# Patient Record
Sex: Female | Born: 1935 | Race: White | Hispanic: No | Smoking: Never smoker
Health system: Southern US, Community
[De-identification: ages and names within clinical notes are randomized; demographics above are authoritative.]

## PROBLEM LIST (undated history)

## (undated) DIAGNOSIS — Z9010 Acquired absence of unspecified breast and nipple: Secondary | ICD-10-CM

## (undated) DIAGNOSIS — F329 Major depressive disorder, single episode, unspecified: Secondary | ICD-10-CM

## (undated) DIAGNOSIS — C50919 Malignant neoplasm of unspecified site of unspecified female breast: Secondary | ICD-10-CM

## (undated) DIAGNOSIS — C801 Malignant (primary) neoplasm, unspecified: Secondary | ICD-10-CM

## (undated) DIAGNOSIS — K259 Gastric ulcer, unspecified as acute or chronic, without hemorrhage or perforation: Secondary | ICD-10-CM

## (undated) DIAGNOSIS — F32A Depression, unspecified: Secondary | ICD-10-CM

## (undated) DIAGNOSIS — F0390 Unspecified dementia without behavioral disturbance: Secondary | ICD-10-CM

## (undated) HISTORY — PX: CHOLECYSTECTOMY: SHX55

## (undated) HISTORY — DX: Unspecified dementia, unspecified severity, without behavioral disturbance, psychotic disturbance, mood disturbance, and anxiety: F03.90

## (undated) HISTORY — PX: APPENDECTOMY: SHX54

## (undated) HISTORY — PX: MASTECTOMY: SHX3

---

## 1999-08-30 ENCOUNTER — Ambulatory Visit (HOSPITAL_COMMUNITY): Admission: RE | Admit: 1999-08-30 | Discharge: 1999-08-30 | Payer: Self-pay | Admitting: Family Medicine

## 2001-08-25 ENCOUNTER — Encounter: Payer: Self-pay | Admitting: Emergency Medicine

## 2001-08-26 ENCOUNTER — Encounter: Payer: Self-pay | Admitting: Family Medicine

## 2001-08-26 ENCOUNTER — Inpatient Hospital Stay (HOSPITAL_COMMUNITY): Admission: EM | Admit: 2001-08-26 | Discharge: 2001-08-27 | Payer: Self-pay | Admitting: Emergency Medicine

## 2003-03-29 ENCOUNTER — Encounter: Admission: RE | Admit: 2003-03-29 | Discharge: 2003-03-29 | Payer: Self-pay | Admitting: Sports Medicine

## 2003-05-25 ENCOUNTER — Ambulatory Visit (HOSPITAL_COMMUNITY): Admission: RE | Admit: 2003-05-25 | Discharge: 2003-05-25 | Payer: Self-pay | Admitting: Specialist

## 2003-07-10 ENCOUNTER — Inpatient Hospital Stay (HOSPITAL_COMMUNITY): Admission: EM | Admit: 2003-07-10 | Discharge: 2003-07-19 | Payer: Self-pay | Admitting: Emergency Medicine

## 2003-07-10 ENCOUNTER — Encounter: Payer: Self-pay | Admitting: Emergency Medicine

## 2003-07-11 ENCOUNTER — Encounter: Payer: Self-pay | Admitting: Gastroenterology

## 2003-09-09 ENCOUNTER — Encounter: Payer: Self-pay | Admitting: Surgery

## 2003-09-10 ENCOUNTER — Inpatient Hospital Stay (HOSPITAL_COMMUNITY): Admission: RE | Admit: 2003-09-10 | Discharge: 2003-09-11 | Payer: Self-pay | Admitting: Surgery

## 2003-09-23 ENCOUNTER — Encounter: Payer: Self-pay | Admitting: General Surgery

## 2003-09-23 ENCOUNTER — Encounter: Admission: RE | Admit: 2003-09-23 | Discharge: 2003-09-23 | Payer: Self-pay | Admitting: General Surgery

## 2003-10-04 ENCOUNTER — Emergency Department (HOSPITAL_COMMUNITY): Admission: EM | Admit: 2003-10-04 | Discharge: 2003-10-04 | Payer: Self-pay | Admitting: *Deleted

## 2003-10-04 ENCOUNTER — Encounter: Payer: Self-pay | Admitting: Surgery

## 2003-10-13 ENCOUNTER — Encounter: Payer: Self-pay | Admitting: Surgery

## 2003-10-13 ENCOUNTER — Encounter: Admission: RE | Admit: 2003-10-13 | Discharge: 2003-10-13 | Payer: Self-pay | Admitting: Surgery

## 2004-02-15 ENCOUNTER — Ambulatory Visit (HOSPITAL_COMMUNITY): Admission: RE | Admit: 2004-02-15 | Discharge: 2004-02-15 | Payer: Self-pay | Admitting: Specialist

## 2011-01-20 ENCOUNTER — Encounter: Payer: Self-pay | Admitting: Orthopedic Surgery

## 2012-06-26 ENCOUNTER — Emergency Department (HOSPITAL_COMMUNITY): Payer: Medicare Other

## 2012-06-26 ENCOUNTER — Encounter (HOSPITAL_COMMUNITY): Payer: Self-pay | Admitting: Family Medicine

## 2012-06-26 ENCOUNTER — Emergency Department (HOSPITAL_COMMUNITY)
Admission: EM | Admit: 2012-06-26 | Discharge: 2012-06-26 | Disposition: A | Discharge status: Home / Self Care | Payer: Medicare Other | Attending: Emergency Medicine | Admitting: Emergency Medicine

## 2012-06-26 DIAGNOSIS — R410 Disorientation, unspecified: Secondary | ICD-10-CM

## 2012-06-26 DIAGNOSIS — Z853 Personal history of malignant neoplasm of breast: Secondary | ICD-10-CM | POA: Insufficient documentation

## 2012-06-26 DIAGNOSIS — R4182 Altered mental status, unspecified: Secondary | ICD-10-CM | POA: Diagnosis not present

## 2012-06-26 DIAGNOSIS — G319 Degenerative disease of nervous system, unspecified: Secondary | ICD-10-CM | POA: Diagnosis not present

## 2012-06-26 DIAGNOSIS — Z0389 Encounter for observation for other suspected diseases and conditions ruled out: Secondary | ICD-10-CM | POA: Diagnosis not present

## 2012-06-26 DIAGNOSIS — R443 Hallucinations, unspecified: Secondary | ICD-10-CM | POA: Diagnosis not present

## 2012-06-26 DIAGNOSIS — F29 Unspecified psychosis not due to a substance or known physiological condition: Secondary | ICD-10-CM | POA: Insufficient documentation

## 2012-06-26 HISTORY — DX: Malignant (primary) neoplasm, unspecified: C80.1

## 2012-06-26 HISTORY — DX: Gastric ulcer, unspecified as acute or chronic, without hemorrhage or perforation: K25.9

## 2012-06-26 LAB — COMPREHENSIVE METABOLIC PANEL
AST: 21 U/L (ref 0–37)
Albumin: 3.6 g/dL (ref 3.5–5.2)
Alkaline Phosphatase: 78 U/L (ref 39–117)
BUN: 16 mg/dL (ref 6–23)
CO2: 26 mEq/L (ref 19–32)
Chloride: 105 mEq/L (ref 96–112)
Potassium: 3.9 mEq/L (ref 3.5–5.1)
Total Bilirubin: 0.6 mg/dL (ref 0.3–1.2)

## 2012-06-26 LAB — GLUCOSE, CAPILLARY: Glucose-Capillary: 94 mg/dL (ref 70–99)

## 2012-06-26 LAB — CBC WITH DIFFERENTIAL/PLATELET
Basophils Absolute: 0 10*3/uL (ref 0.0–0.1)
Basophils Relative: 1 % (ref 0–1)
Hemoglobin: 13.5 g/dL (ref 12.0–15.0)
Lymphocytes Relative: 33 % (ref 12–46)
MCHC: 33.9 g/dL (ref 30.0–36.0)
Monocytes Relative: 6 % (ref 3–12)
Neutro Abs: 3.9 10*3/uL (ref 1.7–7.7)
Neutrophils Relative %: 60 % (ref 43–77)
RBC: 4.34 MIL/uL (ref 3.87–5.11)
WBC: 6.5 10*3/uL (ref 4.0–10.5)

## 2012-06-26 LAB — URINALYSIS, ROUTINE W REFLEX MICROSCOPIC
Bilirubin Urine: NEGATIVE
Glucose, UA: NEGATIVE mg/dL
Ketones, ur: NEGATIVE mg/dL
pH: 6.5 (ref 5.0–8.0)

## 2012-06-26 NOTE — Progress Notes (Signed)
CSW was consulted to provide information on grief and loss along with home health services. CSW met with pt and family. CSW was informed that the pt had just left her daughter's funeral today in [place] and drove directly to the hospital for assistance. According to the EDP, the pt is having an extreme episode/reaction to the loss of her daughter. Pt and family are requesting that the pt be discharged home at this time. Pt's family have arranged for a family member to stay with the pt this evening, as the pt typically lives alone.    CSW provided the pt and family with information on local therapist and psychiatrists that work with geriatric pt's along with hospice care services. Pt and family were agreeable to set up hospice counseling, sharing that a close friend also receives counseling through hospice. CSW also provided a list of home health services along with private duty RN's to the pt and family and encouraged them to talk with the PCP Dr. Tanya Nones on Monday, regarding placing orders for home health. Information was also presented to the family on [facility] which included adult day care, respite care, and in home care volunteers. No further needs were identified at this time.

## 2012-06-26 NOTE — Discharge Instructions (Signed)
Paranoia Paranoia is a distrust of others that is not based on a real reason for distrust. This may reach delusional levels. This means the paranoid person feels the world is against them when there is no reason to make them feel that way. People with paranoia feel as though people around them are "out to get them".  SIMILAR MENTAL ILNESSES  Depression is a feeling as though you are down all the time. It is normal in some situations where you have just lost a loved one. It is abnormal if you are having feelings of paranoia with it.   Dementia is a physical problem with the brain in which the brain no longer works properly. There are problems with daily activities of living. Alzheimer's disease is one example of this. Dementia is also caused by old age changes in the brain which come with the death of brain cells and small strokes.   Paranoidschizophrenia. People with paranoid schizophrenia and persecutory delusional disorder have delusions in which they feel people around them are plotting against them. Persecutory delusions in paranoid schizophrenia are bizarre, sometimes grandiose, and often accompanied by auditory hallucinations. This means the person is hearing voices that are not there.   Delusionaldisorder (persecutory type). Delusions experienced by individuals with delusional disorder are more believable than those experienced by paranoid schizophrenics; they are not bizarre, though still unjustified. Individuals with delusional disorder may seem offbeat or quirky rather than mentally ill, and therefore, may never seek treatment.  All of these problems usually do not allow these people to interact socially in an acceptable manner. CAUSES The cause of paranoia is often not known. It is common in people with extended abuse of:  Cocaine.   Amphetamine.   Marijuana.   Alcohol.  Sometimes there is an inherited tendency. It may be associated with stress or changes in brain  chemistry. DIAGNOSIS  When paranoia is present, your caregiver may:  Refer you to a specialist.   Do a physical exam.   Perform other tests on you to make sure there are not other problems causing the paranoia including:   Physical problems.   Mental problems.   Chemical problems (other than drugs).  Testing may be done to determine if there is a psychiatric disability present that can be treated with medicine. TREATMENT   Paranoia that is a symptom of a psychiatric problem should be treated by professionals.   Medicines are available which can help this disorder. Antipsychotic medicine may be prescribed by your caregiver.   Sometimes psychotherapy may be useful.   Conditions such as depression or drug abuse are treated individually. If the paranoia is caused by drug abuse, a treatment facility may be helpful. Depression may be helped by antidepressants.  PROGNOSIS   Paranoid people are difficult to treat because of their belief that everyone is out to get them or harm them. Because of this mistrust, they often must be talked into entering treatment by a trusted family member or friend. They may not want to take medicine as they may see this as an attempt to poison them.   Gradual gains in the trust of a therapist or caregiver helps in a successful treatment plan.   Some people with PPD or persecutory delusional disorder function in society without treatment in limited fashion.  Document Released: 12/19/2003 Document Revised: 12/05/2011 Document Reviewed: 08/23/2008 [facility] Patient Information 2012 [place], [place].  RESOURCE GUIDE  Dental Problems  Patients with Medicaid: [facility]  [facility] [address]                                           [phone] [address] Phone:  [phone]                                                   Phone:  [phone]  If unable to pay or uninsured, contact:  [facility] or [facility]. to become qualified for the adult dental clinic.  Chronic Pain Problems Contact [facility]  [phone] Patients need to be referred by their primary care doctor.  Insufficient Money for Medicine Contact [employer]:  call "211" or [facility] [phone].  No Primary Care Doctor Call [facility]  [phone] Other agencies that provide inexpensive medical care    [facility]  [phone]    [facility]  [phone]    [facility]  [phone]    [facility]  [phone]    [facility]  [phone]    [facility]  [phone]  Psychological Services [facility]  [phone] [facility]  [phone] [facility]   [phone] (emergency services [phone])  Abuse/Neglect [facility] Child Abuse Hotline [phone] [facility] Child Abuse Hotline [phone] (After Hours)  Emergency Shelter [facility] [phone]  [facility] [phone] [facility] [phone]  MRSA Hotline #:   (574) 433-3843    [facility] Resources  [facility]  [employer]                           [facility] [address] [place]                     [address]         [address]  Blondell Reveal Phone:  [phone]                                  Phone:  [phone]                   Phone:  [phone]  [facility] Phone:  [phone]  [facility] Child Abuse Hotline ([phone] (After Hours)

## 2012-06-26 NOTE — ED Notes (Signed)
Talked with pt. About getting a urine sample. Pt stated that she wanted to try drinking some water so that she could get an "urge." Pt. Was given a cup of water and will attempt to void within 30 minutes.

## 2012-06-26 NOTE — ED Provider Notes (Addendum)
History     CSN: [id number]  Arrival date & time 06/26/12  1713   First MD Initiated Contact with Patient 06/26/12 1826      Chief Complaint  Patient presents with  . Altered Mental Status    (Consider location/radiation/quality/duration/timing/severity/associated sxs/prior treatment) HPI  89yoF is with also mental status. Per the patient's family they state that he has been having paranoid hallucinations. This started shortly after her daughter died a few days ago. He states that the funeral and fourth finger as well she was adamantly denying that her daughter is dead. She states that they "are trying to trick me, they put someone else in there". She also stated to her daughter that they were holding her her prisoner in a hotel room. She has been aggressive and tone but not in behavior. They state that she's been intermittently confused the last few days. Prior to this time she has had vivid nightmares and sleepwalking. No known history of dementia. No recent illness. She denies fevers, chills. The patient herself states she has no complaints at this time. She denies headache, dizziness, chest pain, shortness of breath, fevers, chills, abdominal pain, nausea, vomiting. Denies hematuria/dysuria/freq/urgency. No new medications. In fact, she takes no medications at home.  Past Medical History  Diagnosis Date  . Gastric ulcer   . Cancer     breast    Past Surgical History  Procedure Date  . Cholecystectomy   . Mastectomy     History reviewed. No pertinent family history.  History  Substance Use Topics  . Smoking status: Never Smoker   . Smokeless tobacco: Not on file  . Alcohol Use: No    OB History    Grav Para Term Preterm Abortions TAB SAB Ect Mult Living                  Review of Systems  All other systems reviewed and are negative.   except as noted HPI   Allergies  Ciprofloxacin  Home Medications   Current Outpatient Rx  Name Route Sig Dispense Refill    . PSYLLIUM 58.6 % PO PACK Oral Take 1 packet by mouth daily.      BP 157/71  Pulse 94  Temp 98.1 F (36.7 C) (Oral)  Resp 16  SpO2 97%  Physical Exam  Nursing note and vitals reviewed. Constitutional: She is oriented to person, place, and time. She appears well-developed.  HENT:  Head: Atraumatic.  Mouth/Throat: Oropharynx is clear and moist.  Eyes: Conjunctivae and EOM are normal. Pupils are equal, round, and reactive to light.  Neck: Normal range of motion. Neck supple.  Cardiovascular: Normal rate, regular rhythm, normal heart sounds and intact distal pulses.   Pulmonary/Chest: Effort normal and breath sounds normal. No respiratory distress. She has no wheezes. She has no rales.  Abdominal: Soft. She exhibits no distension. There is no tenderness. There is no rebound and no guarding.  Musculoskeletal: Normal range of motion. She exhibits no edema.  Neurological: She is alert and oriented to person, place, and time. No cranial nerve deficit. She exhibits normal muscle tone. Coordination normal.       Strength 5/5 all extremities No pronator drift No facial droop   Skin: Skin is warm and dry. No rash noted.  Psychiatric: She has a normal mood and affect.    Date: 06/28/2012  Rate: 84  Rhythm: normal sinus rhythm  QRS Axis: left  Intervals: normal  ST/T Wave abnormalities: normal  Conduction Disutrbances:none  Narrative Interpretation: PVCs  Old EKG Reviewed: none available    ED Course  Procedures (including critical care time)  Labs Reviewed  COMPREHENSIVE METABOLIC PANEL - Abnormal; Notable for the following:    GFR calc non Af Amer 73 (*)     GFR calc Af Amer 85 (*)     All other components within normal limits  URINALYSIS, ROUTINE W REFLEX MICROSCOPIC - Abnormal; Notable for the following:    Hgb urine dipstick TRACE (*)     All other components within normal limits  GLUCOSE, CAPILLARY  CBC WITH DIFFERENTIAL  LIPASE, BLOOD  URINE MICROSCOPIC-ADD ON  LAB  REPORT - SCANNED   Dg Chest 2 View  06/26/2012  *RADIOLOGY REPORT*  Clinical Data: Altered mental status  CHEST - 2 VIEW  Comparison: None  Findings: The heart size and mediastinal contours are within normal limits. There are coarsened interstitial markings identified bilaterally.  Surgical clips noted in the left axilla.  No airspace consolidation.  The visualized skeletal structures are unremarkable.  IMPRESSION:  No acute findings.  Original Report Authenticated By: Rosealee Albee, M.D.   Ct Head Wo Contrast  06/26/2012  *RADIOLOGY REPORT*  Clinical Data: Altered mental status.  CT HEAD WITHOUT CONTRAST  Technique:  Contiguous axial images were obtained from the base of the skull through the vertex without contrast.  Comparison: No priors.  Findings: Mild cerebral and cerebellar atrophy is noted and is age appropriate.  No definite evidence to suggest acute/subacute cerebral ischemia, no intracranial hemorrhage, no focal mass, mass effect, hydrocephalus or abnormal intra or extra-axial fluid collections.  No acute displaced skull fractures are identified. Visualized paranasal sinuses and mastoids are well pneumatized.  IMPRESSION: 1.  No acute intracranial abnormality. 2.  Mild cerebral and cerebellar atrophy is age appropriate.  Original Report Authenticated By: Florencia Reasons, M.D.    1. Hallucination   2. Confusion     MDM  The patient is having paranoid hallucinations and some confusion. Question biliary versus dementia versus grief reaction due to her recent daughters death. She was also in completely different environment. Her ED workup including CT head, labs, urinalysis are completely unremarkable. Her neuro exam is unremarkable. She appears well at this time her vitals are stable. She is refusing psychiatry evaluation in the emergency department. She wants to go home and the family is agreeable to this. They think given resources by social worker for possible help with home health at  home. Over the weekend they will stay with her and she will follow with her primary care doctor on Monday. No EMC precluding discharge at this time. Given Precautions for return. PMD f/u.         Forbes Cellar, MD 06/28/12 1610  Forbes Cellar, MD 06/28/12 [phone]

## 2012-06-26 NOTE — ED Notes (Signed)
Family states that pt's daughter recently died, and pt and some family went to attend funeral in [place].  Son in law states that the night before last pt began to say that there was a stranger in the house and that she would call the police.  Pt had nightmares throughout the night.  The next am the pt would not look at daughter until family arrived.  Later that day pt did not recognize daughter, and said delusion things such as there were armed guards with dogs at the house she was staying at, and that the family was drugging her.  Once pt got home pt still had episodes of not recognizing her daughter and granddaughters.

## 2012-06-28 ENCOUNTER — Emergency Department (HOSPITAL_COMMUNITY)
Admission: EM | Admit: 2012-06-28 | Discharge: 2012-06-28 | Disposition: A | Discharge status: Home / Self Care | Payer: Medicare Other | Attending: Emergency Medicine | Admitting: Emergency Medicine

## 2012-06-28 ENCOUNTER — Encounter (HOSPITAL_COMMUNITY): Payer: Self-pay

## 2012-06-28 DIAGNOSIS — Z0389 Encounter for observation for other suspected diseases and conditions ruled out: Secondary | ICD-10-CM | POA: Diagnosis not present

## 2012-06-28 DIAGNOSIS — R443 Hallucinations, unspecified: Secondary | ICD-10-CM | POA: Diagnosis not present

## 2012-06-28 DIAGNOSIS — Z853 Personal history of malignant neoplasm of breast: Secondary | ICD-10-CM | POA: Insufficient documentation

## 2012-06-28 DIAGNOSIS — F23 Brief psychotic disorder: Secondary | ICD-10-CM | POA: Diagnosis not present

## 2012-06-28 DIAGNOSIS — IMO0002 Reserved for concepts with insufficient information to code with codable children: Secondary | ICD-10-CM | POA: Diagnosis not present

## 2012-06-28 MED ORDER — RISPERIDONE 0.25 MG PO TABS: 0.2500 mg | ORAL_TABLET | Freq: Every day | ORAL | Status: DC

## 2012-06-28 NOTE — Discharge Instructions (Signed)
Psychosis  Psychosis refers to a severe lack of understanding with reality. During a psychotic episode, you are not able to think clearly. During a psychotic episode, your responses and emotions are inappropriate and do not coincide with what is actually happening. You often have false beliefs about what is happening or who you are (delusions), and you may see, hear, taste, smell, or feel things that are not present (hallucinations). Psychosis is usually a severe symptom of a very serious mental health (psychiatric) condition, but it can sometimes be the result of a medical condition.  CAUSES    Psychiatric conditions, such as:   Schizophrenia.   Bipolar disorder.   Depression.   Personality disorders.   Alcohol or drug abuse.   Medical conditions, such as:   Brain injury.   Brain tumor.   Dementia.   Brain diseases, such as Alzheimer's, Parkinson's, or Huntington's disease.   Neurological diseases, such as epilepsy.   Genetic disorders.   Metabolic disorders.   Infections that affect the brain.   Certain prescription drugs.   Stroke.  SYMPTOMS    Unable to think or speak clearly or respond appropriately.   Disorganized thinking (thoughts jump from one thought to another).   Severe inappropriate behavior.   Delusions may include:   A strong belief that is odd, unrealistic, or false.   Feeling extremely fearful or suspicious (paranoid).   Believing you are someone else, have high importance, or have an altered identity.   Hallucinations.  DIAGNOSIS    Mental health evaluation.   Physical exam.   Blood tests.   Computerized magnetic scan (MRI) or other brain scans.  TREATMENT   Your caregiver will recommend a course of treatment that depends on the cause of the psychosis.  Treatment may include:   Monitoring and supportive care in the hospital.   Taking medicines (antipsychotic medicine) to reduce symptoms and balance chemicals in the brain.   Taking medicines to manage underlying  mental health conditions.   Therapy and other supportive programs outside of the hospital.   Treating an underlying medical condition.  If the cause of the psychosis can be treated or corrected, the outlook is good. Without treatment, psychotic episodes can cause danger to yourself or others. Treatment may be short-term or lifelong.  HOME CARE INSTRUCTIONS    Take all medicines as directed. This is important.   Use a pillbox or write down your medicine schedule to make sure you are taking them.   Check with your caregiver before using over-the-counter medicines, herbs, or supplements.   Seek individual and family support through therapy and mental health education (psychoeducation) programs. These will help you manage symptoms and side effects of medicines, learn life skills, and maintain a healthy routine.   Maintain a healthy lifestyle.   Exercise regularly.   Avoid alcohol and drugs.   Learn ways to reduce stress and cope with stress, such as yoga and meditation.   Talk about your feelings with family members or caregivers.   Make time for yourself to do things you enjoy.   Know the early warning signs of psychosis. Your caregiver will recommend steps to take when you notice symptoms such as:   Feeling anxious or preoccupied.   Having racing thoughts.   Changes in your interest in life and relationships.   Follow up with your caregivers for continued outpatient treatment as directed.  SEEK MEDICAL CARE IF:    Medicines do not seem to be helping.   You hear   or feel things that are not there.   You feel hopeless and overwhelmed.   You feel extremely fearful and suspicious that something will harm you.   You feel like you cannot leave your house.   You have trouble taking care of yourself.   You experience side effects of medicines, such as changes in sleep patterns, dizziness, weight  gain, restlessness, movement changes, muscle spasms, or tremors.  SEEK IMMEDIATE MEDICAL CARE IF:  Severe psychotic symptoms present a safety issue (such as an urge to hurt yourself or others). MAKE SURE YOU:   Understand these instructions.   Will watch your condition.   Will get help right away if you are not doing well or get worse.  FOR MORE INFORMATION  National Institute of Mental Health: http://www.maynard.net/ Document Released: 06/05/2010 Document Revised: 12/05/2011 Document Reviewed: 06/05/2010 [facility] Patient Information 2012 [place], [place].RESOURCE GUIDE  Chronic Pain Problems: Contact [facility]  [phone] Patients need to be referred by their primary care doctor.  Insufficient Money for Medicine: Contact United Way:  call "211" or [facility] 587-697-3566.  No Primary Care Doctor: - Call [facility]  [phone] - can help you locate a primary care doctor that  accepts your insurance, provides certain services, etc. - Physician Referral Service- [phone]  Agencies that provide inexpensive medical care: - [facility]  [phone] - [facility]  [phone] - [facility]  [phone] - Women's Clinic  [phone] - Planned Parenthood  [phone] [facility]  [phone]  Medicaid-accepting [facility] Providers: - [facility]- [address]  [phone], Mon-Fri 9am-7pm, Sat 9am-1pm - [facility]- [address] [place], [address] [place]  [phone] - [facility]- [address], [address] [place]  [phone] [facility]- [address]  [phone] - Renaye Rakers- [address] [place], [address], [phone]  Only accepts [place] patients after they have their name  applied to their card  Self Pay (no insurance) in [place]: - Sickle Cell Patients: Dr Willey Blade, [facility] Internal  Medicine  [address] [place], [phone] - [facility]- [address] [place]  [phone]       [facility] [place]- [address]       -     [facility]- see information above (Speak to [employer] if you do not have insurance)       -  [facility]- [address] [place], [phone]       -  [facility]- [address],  [phone]       -  [facility]- [address], [phone]       -  Dr Julio Sicks-  [address], [place], [phone]       -  [facility]- [address] [phone]       -  [facility]- [address] [phone], also [address] [phone]       -    [facility]- [address] [place], [phone], 1st & 3rd Saturday   every month, 10am-1pm  1) Find a Doctor and Pay Out of Pocket Although you won't have to find out who is covered by your insurance plan, it is  a good idea to ask around and get recommendations. You will then need to call the office and see if the doctor you have chosen will accept you as a new patient and what types of options they offer for patients who are self-pay. Some doctors offer discounts or will set up payment plans for their patients who do not have insurance, but you will need to ask so you aren't surprised when you get to your appointment.  2) Contact Your Local Health Department Not all health departments have doctors that can see patients for sick visits, but many do, so it is worth a call to see if yours does. If you don't know where your local health department is, you can check in your phone book. The CDC also has a tool to help you locate your state's health department, and many state websites also have listings of all of their local health departments.  3) Find a Walk-in Clinic If your illness is not likely to be very severe or complicated, you may want to try a walk in clinic. These are popping up all over the  country in pharmacies, drugstores, and shopping centers. They're usually staffed by nurse practitioners or physician assistants that have been trained to treat common illnesses and complaints. They're usually fairly quick and inexpensive. However, if you have serious medical issues or chronic medical problems, these are probably not your best option  STD Testing - [facility] Department of [facility], STD Clinic, [address], [place], phone [phone] or [phone].  Monday - Friday, call for an appointment. [facility] Department of [employer], STD Clinic, [address], [place], phone [phone] or [phone].  Monday - Friday, call for an appointment.  Abuse/Neglect: [facility] Child Abuse Hotline 480 011 2789 [facility] Child Abuse Hotline 217-233-1304 (After Hours)  Emergency Shelter:  [facility] 872-559-9313  Maternity Homes: - [facility] 856-436-3865 - [facility] [phone]  MRSA Hotline #:   (229) 413-8530  [facility] Resources  [facility] [place]  [employer] [facility] Dept. [address]                 [address]         [address] [place] [address]  Blondell Reveal Phone:  [phone]                                  Phone:  [phone]                   Phone:  [phone]  [facility] Mental Health, [phone] - [facility] - [phone]       -     [facility] in [place], [address],  [phone], [facility] Child Abuse Hotline (740) 521-9897 or (586)198-3830 (After Hours)   Behavioral Health Services  Substance Abuse Resources: - [facility] Drug [facility]  810-593-4152 - [facility] [phone] - [facility] [place] [phone] Floydene Flock [phone] - Residential & Outpatient Substance Abuse Program  3133175610  Psychological Services: [facility]  [phone] [facility] Services  [phone] - [facility], (208)506-3032 [place]. [address], [place], ACCESS LINE: 8257661020 or 901-595-5075, [employer].za  Dental Assistance  If unable to pay or uninsured, contact:  [facility] or [facility]. to become qualified for the adult dental clinic.  Patients with Medicaid: [facility] [phone] [address], [phone] [address] [phone]  If unable to pay, or uninsured, contact [facility] 3150952926) or [facility] Department [phone]) [phone] in [place], [phone] in [facility]) to become qualified for the adult dental clinic  Other Low-Cost Community Dental Services: - [facility]- [address] [place], [place], [place], [address], [phone], [phone], 2nd and 4th Thursday of the month at 6:30am.  10 clients each day by appointment, can sometimes see walk-in patients if someone does not show for an appointment. [facility]- [address] Ether Griffins [place], [place], [address], [phone] - [facility]- [address] [place], [place], [address], [phone] - [facility]- [phone] [facility] Department- [phone] [facility] Department- [phone]

## 2012-06-28 NOTE — ED Notes (Signed)
Daughter has called twice today regarding mother's mental status. Mother is paranoid and thinks people are trying to kill her. The family has attempted to bring pt via POV and was unsuccessful. At present they have called for EMS to assist. Daughter aware that she may have to have mother IVC'd in order to get help for her.

## 2012-06-28 NOTE — ED Notes (Signed)
Pt's son-in-law committed suicide recently, daughter died of heart attack and funeral was Thursday. Family member sts pt been acting abnormal since the night prior to the funeral, abnormalities include not recognizing her family members, refusing to believe her daughter passed away, believes people are trying to harm her, and believes her foods has poison therefore refusing to eat nor drink. Family member brought pt in on Friday, evaluated by Dr. Hyman Hopes dx with paranoid delusion, send home for Psy MD follow-up. Pt feeling better on Saturday, and back today to the ED for worsening of same symptoms. Upon assessing pt, pt denied pain, alert, knows who she is, but doesn't know why she is here.

## 2012-06-28 NOTE — ED Notes (Signed)
OZH:[id number]<HQ> Expected date:06/28/12<BR> Expected time: 3:20 PM<BR> Means of arrival:<BR> Comments:<BR> Hold per charge

## 2012-06-28 NOTE — ED Provider Notes (Addendum)
History     CSN: [id number] Arrival date & time 06/28/12  1523 First MD Initiated Contact with Patient 06/28/12 1600      Chief Complaint  Patient presents with  . Delusional    paranoid   HPI Per nursing notes: Daughter died of heart attack and funeral was Thursday. Family member sts pt been acting abnormal since the night prior to the funeral, abnormalities include not recognizing her family members, refusing to believe her daughter passed away, believes people are trying to harm her, and believes her foods has poison therefore refusing to eat nor drink. Family member brought pt in on Friday, evaluated by Dr. Hyman Hopes dx with paranoid delusion, send home for Psy MD follow-up. Pt feeling better on Saturday, and back today to the ED for worsening of same symptoms. Upon assessing pt, pt denied pain, alert, knows who she is, but doesn't know why she is here.  The family is still very concerned because she is not acting like her normal self. She has not had any fevers or any pain. She has not had any recent falls. The patient herself is not really sure whether these things she has been seeing a real. Patient has are story about what she saw during the funeral including egyptian symbols, a "bore" and "bier"  Past Medical History  Diagnosis Date  . Gastric ulcer   . Cancer     breast    Past Surgical History  Procedure Date  . Cholecystectomy   . Mastectomy     No family history on file.  History  Substance Use Topics  . Smoking status: Never Smoker   . Smokeless tobacco: Not on file  . Alcohol Use: No    OB History    Grav Para Term Preterm Abortions TAB SAB Ect Mult Living                  Review of Systems  All other systems reviewed and are negative.    Allergies  Ciprofloxacin  Home Medications   Current Outpatient Rx  Name Route Sig Dispense Refill  . DOCUSATE SODIUM 100 MG PO CAPS Oral Take 100 mg by mouth 2 (two) times daily.    Marland Kitchen POLYETHYLENE GLYCOL 3350 PO  PACK Oral Take 17 g by mouth daily.    Scherrie November PO POWD Oral Take 1 scoop by mouth daily.      BP 144/49  Pulse 84  Temp 98.9 F (37.2 C) (Oral)  Resp 23  SpO2 100%  Physical Exam  Nursing note and vitals reviewed. Constitutional: She is oriented to person, place, and time. She appears well-developed and well-nourished. No distress.  HENT:  Head: Normocephalic and atraumatic.  Right Ear: External ear normal.  Left Ear: External ear normal.  Mouth/Throat: Oropharynx is clear and moist.  Eyes: Conjunctivae are normal. Right eye exhibits no discharge. Left eye exhibits no discharge. No scleral icterus.  Neck: Neck supple. No tracheal deviation present.  Cardiovascular: Normal rate, regular rhythm and intact distal pulses.   Pulmonary/Chest: Effort normal and breath sounds normal. No stridor. No respiratory distress. She has no wheezes. She has no rales.  Abdominal: Soft. Bowel sounds are normal. She exhibits no distension. There is no tenderness. There is no rebound and no guarding.  Musculoskeletal: She exhibits no edema and no tenderness.  Neurological: She is alert and oriented to person, place, and time. She has normal strength. No cranial nerve deficit ( no gross defecits noted) or sensory deficit. She  exhibits normal muscle tone. She displays no seizure activity. Coordination normal.       No pronator drift bilateral upper extrem, able to hold both legs off bed for 5 seconds, sensation intact in all extremities, no visual field cuts, no left or right sided neglect  Skin: Skin is warm and dry. No rash noted.  Psychiatric: She has a normal mood and affect. Her behavior is normal. She expresses no homicidal and no suicidal ideation.       delusions    ED Course  Procedures (including critical care time) Pt had labs on 6/28: CBC , CMET, head ct, ua all normal.  Family does not want additional testing done today.  Labs Reviewed - No data to display Dg Chest 2 View  06/26/2012   *RADIOLOGY REPORT*  Clinical Data: Altered mental status  CHEST - 2 VIEW  Comparison: None  Findings: The heart size and mediastinal contours are within normal limits. There are coarsened interstitial markings identified bilaterally.  Surgical clips noted in the left axilla.  No airspace consolidation.  The visualized skeletal structures are unremarkable.  IMPRESSION:  No acute findings.  Original Report Authenticated By: Rosealee Albee, M.D.   Ct Head Wo Contrast  06/26/2012  *RADIOLOGY REPORT*  Clinical Data: Altered mental status.  CT HEAD WITHOUT CONTRAST  Technique:  Contiguous axial images were obtained from the base of the skull through the vertex without contrast.  Comparison: No priors.  Findings: Mild cerebral and cerebellar atrophy is noted and is age appropriate.  No definite evidence to suggest acute/subacute cerebral ischemia, no intracranial hemorrhage, no focal mass, mass effect, hydrocephalus or abnormal intra or extra-axial fluid collections.  No acute displaced skull fractures are identified. Visualized paranasal sinuses and mastoids are well pneumatized.  IMPRESSION: 1.  No acute intracranial abnormality. 2.  Mild cerebral and cerebellar atrophy is age appropriate.  Original Report Authenticated By: Florencia Reasons, M.D.     1. Brief reactive psychosis       MDM  The patient was evaluated by the psychiatrist (tele).  He felt the patient had a brief reactive psychosis associated with her grief reaction. Patient does not have a delirium .  She does not meet inpatient criteria. The patient does not like to take medications but she is willing to try a prescription for Risperdal 0.25 mg by mouth daily as recommended. The patient and family were also provided outpatient resources with regards to finding a psychiatrist.        Celene Kras, MD 06/28/12 1843  Celene Kras, MD 07/16/12 0800

## 2012-06-28 NOTE — ED Notes (Signed)
psy tele-consult called, psychiatrist will call back in to 1 hour

## 2012-06-29 ENCOUNTER — Emergency Department (HOSPITAL_COMMUNITY)
Admission: EM | Admit: 2012-06-29 | Discharge: 2012-06-30 | Disposition: A | Discharge status: Other Acute Inpatient Hospital | Payer: Medicare Other | Attending: Emergency Medicine | Admitting: Emergency Medicine

## 2012-06-29 ENCOUNTER — Emergency Department (HOSPITAL_COMMUNITY): Payer: Medicare Other

## 2012-06-29 ENCOUNTER — Encounter (HOSPITAL_COMMUNITY): Payer: Self-pay | Admitting: *Deleted

## 2012-06-29 ENCOUNTER — Telehealth (HOSPITAL_COMMUNITY): Payer: Self-pay | Admitting: Licensed Clinical Social Worker

## 2012-06-29 DIAGNOSIS — Z0389 Encounter for observation for other suspected diseases and conditions ruled out: Secondary | ICD-10-CM | POA: Diagnosis not present

## 2012-06-29 DIAGNOSIS — R443 Hallucinations, unspecified: Secondary | ICD-10-CM | POA: Diagnosis not present

## 2012-06-29 DIAGNOSIS — Z853 Personal history of malignant neoplasm of breast: Secondary | ICD-10-CM | POA: Insufficient documentation

## 2012-06-29 DIAGNOSIS — R451 Restlessness and agitation: Secondary | ICD-10-CM

## 2012-06-29 DIAGNOSIS — IMO0002 Reserved for concepts with insufficient information to code with codable children: Secondary | ICD-10-CM | POA: Diagnosis not present

## 2012-06-29 DIAGNOSIS — Z79899 Other long term (current) drug therapy: Secondary | ICD-10-CM | POA: Insufficient documentation

## 2012-06-29 DIAGNOSIS — R4586 Emotional lability: Secondary | ICD-10-CM | POA: Insufficient documentation

## 2012-06-29 HISTORY — DX: Major depressive disorder, single episode, unspecified: F32.9

## 2012-06-29 HISTORY — DX: Depression, unspecified: F32.A

## 2012-06-29 LAB — URINALYSIS, ROUTINE W REFLEX MICROSCOPIC
Ketones, ur: NEGATIVE mg/dL
Leukocytes, UA: NEGATIVE
Nitrite: NEGATIVE
Urobilinogen, UA: 0.2 mg/dL (ref 0.0–1.0)
pH: 5.5 (ref 5.0–8.0)

## 2012-06-29 LAB — COMPREHENSIVE METABOLIC PANEL
Alkaline Phosphatase: 75 U/L (ref 39–117)
BUN: 15 mg/dL (ref 6–23)
CO2: 23 mEq/L (ref 19–32)
Chloride: 104 mEq/L (ref 96–112)
GFR calc Af Amer: 89 mL/min — ABNORMAL LOW (ref >90)
Glucose, Bld: 94 mg/dL (ref 70–99)
Potassium: 4.3 mEq/L (ref 3.5–5.1)
Total Bilirubin: 0.8 mg/dL (ref 0.3–1.2)

## 2012-06-29 LAB — URINE MICROSCOPIC-ADD ON

## 2012-06-29 LAB — CBC WITH DIFFERENTIAL/PLATELET
Basophils Absolute: 0 10*3/uL (ref 0.0–0.1)
Eosinophils Relative: 1 % (ref 0–5)
Lymphocytes Relative: 25 % (ref 12–46)
MCV: 90.8 fL (ref 78.0–100.0)
Neutro Abs: 5.6 10*3/uL (ref 1.7–7.7)
Platelets: 161 10*3/uL (ref 150–400)
RDW: 13.5 % (ref 11.5–15.5)
WBC: 8.1 10*3/uL (ref 4.0–10.5)

## 2012-06-29 LAB — TROPONIN I: Troponin I: <0.3 ng/mL (ref <0.30)

## 2012-06-29 LAB — RAPID URINE DRUG SCREEN, HOSP PERFORMED
Benzodiazepines: NOT DETECTED
Cocaine: NOT DETECTED
Opiates: NOT DETECTED

## 2012-06-29 LAB — ETHANOL: Alcohol, Ethyl (B): <11 mg/dL (ref 0–11)

## 2012-06-29 NOTE — ED Notes (Signed)
Upon rn assessment. Pt lives alone is alert and oriented x4. 2 weeks ago pts daugther died. Pt had to travel to [facility] for funeral. Pt reports that at funeral she "saw lots of demons". But the priest came and she no longer sees demons. Pt denies any other auditory or visual hallucinations. Denies SI/ HI.   Pt at time is very agitated with her daughter Arline Asp. Pt reports that her daughter cindy tries to bait her. pts son in law has a video of pt being agitated towards cindy that he showed to rn. Family unsure what do to with pt at this time.

## 2012-06-29 NOTE — ED Notes (Signed)
MD at bedside. 

## 2012-06-29 NOTE — Progress Notes (Signed)
EDP, Mcmanus and CM reviewed pt.  Family has brought pt in to [facility] ED on 06/26/12, 06/27/12, 06/28/12 and 06/29/12.  ACT team reviewing pt possible geriatric placement.

## 2012-06-29 NOTE — ED Notes (Signed)
1 bag of belongings locked in locker 29. 1 yellow colored sweater, blue pants, blue shirt, 1 pair of black shoes, 2 white socks.

## 2012-06-29 NOTE — ED Notes (Signed)
Pt back from CT

## 2012-06-29 NOTE — ED Notes (Signed)
[id number]<QM> Expected date:06/29/12<BR> Expected time:12:14 PM<BR> Means of arrival:Ambulance<BR> Comments:<BR>

## 2012-06-29 NOTE — ED Notes (Signed)
Patient resting quietly in bed with eyes closed. Respirations equal and unlabored. Skin warm and dry. NO acute distress noted.

## 2012-06-29 NOTE — ED Notes (Signed)
Pts family would like to be called if pt is moved Jolaine Click [phone](home), [phone](cell)

## 2012-06-29 NOTE — ED Notes (Signed)
Spoke with act team, pt to be seen next.

## 2012-06-29 NOTE — ED Provider Notes (Signed)
History     CSN: [id number]  Arrival date & time 06/29/12  1220   First MD Initiated Contact with Patient 06/29/12 1228      Chief Complaint  Patient presents with  . Medical Clearance    HPI Pt was seen at 1240.  Per pt and family, c/o gradual onset and worsening of persistent emotionally labilily, being "mean" to her family, and confusion for the past 6 months, worse over the past 2 weeks.  Family states pt will occasionally not recognize them, asking who they are and what are they doing to her "keeping me down like this" and "trying to kill me."  Pt repetitively stating to family that she "wants to go home" while she is standing in her own home, and apparently packed her suitcase last night, threatening to leave "for a long walk until I dropped."  Apparently pt's grandchild laid in front of the door so she would not leave the house.  Pt states to me that she has been seeing "a wild boar" since the recent funeral for her daughter 2 weeks ago, and that driving in the car to [place] for the funeral "was like being in a movie."   Pt states she "spoke with the [facility]" yesterday with resolution of visual hallucination of the "wild boar," but pt's family states they also gave her one dose (her first dose) of Risperdal yesterday; as rx by the ED yesterday for this same complaint.  Pt has been previously eval in the ED x2 for same, most recently yesterday, with family returning today saying they "can't take care of her like this."  Pt denies SI/SA, no HI.     Past Medical History  Diagnosis Date  . Gastric ulcer   . Cancer     breast    Past Surgical History  Procedure Date  . Cholecystectomy   . Mastectomy     History  Substance Use Topics  . Smoking status: Never Smoker   . Smokeless tobacco: Not on file  . Alcohol Use: No    Review of Systems ROS: Statement: All systems negative except as marked or noted in the HPI; Constitutional: Negative for fever and chills. ; ; Eyes: Negative for  eye pain, redness and discharge. ; ; ENMT: Negative for ear pain, hoarseness, nasal congestion, sinus pressure and sore throat. ; ; Cardiovascular: Negative for chest pain, palpitations, diaphoresis, dyspnea and peripheral edema. ; ; Respiratory: Negative for cough, wheezing and stridor. ; ; Gastrointestinal: Negative for nausea, vomiting, diarrhea, abdominal pain, blood in stool, hematemesis, jaundice and rectal bleeding. . ; ; Genitourinary: Negative for dysuria, flank pain and hematuria. ; ; Musculoskeletal: Negative for back pain and neck pain. Negative for swelling and trauma.; ; Skin: Negative for pruritus, rash, abrasions, blisters, bruising and skin lesion.; ; Neuro: Negative for headache, lightheadedness and neck stiffness. Negative for weakness, altered level of consciousness , altered mental status, extremity weakness, paresthesias, involuntary movement, seizure and syncope.; Psych:  No SI, no SA, no HI, +hallucinations.      Allergies  Ciprofloxacin  Home Medications   Current Outpatient Rx  Name Route Sig Dispense Refill  . DOCUSATE SODIUM 100 MG PO CAPS Oral Take 100 mg by mouth 2 (two) times daily.    Marland Kitchen POLYETHYLENE GLYCOL 3350 PO PACK Oral Take 17 g by mouth daily.    Scherrie November PO POWD Oral Take 1 scoop by mouth daily.    Marland Kitchen RISPERIDONE 0.25 MG PO TABS Oral Take 1 tablet (  0.25 mg total) by mouth at bedtime. 30 tablet 1    BP 166/66  Pulse 85  Temp 98.7 F (37.1 C) (Oral)  Resp 12  SpO2 100%  Physical Exam 1245: Physical examination:  Nursing notes reviewed; Vital signs and O2 SAT reviewed;  Constitutional: Well developed, Well nourished, Well hydrated, In no acute distress; Head:  Normocephalic, atraumatic; Eyes: EOMI, PERRL, No scleral icterus; ENMT: Mouth and pharynx normal, Mucous membranes moist; Neck: Supple, Full range of motion, No lymphadenopathy; Cardiovascular: Regular rate and rhythm, No murmur, rub, or gallop; Respiratory: Breath sounds clear & equal  bilaterally, No rales, rhonchi, wheezes.  Speaking full sentences with ease, Normal respiratory effort/excursion; Chest: Nontender, Movement normal; Abdomen: Soft, Nontender, Nondistended, Normal bowel sounds;; Extremities: Pulses normal, No tenderness, No edema, No calf edema or asymmetry.; Neuro: AA&Ox3, Major CN grossly intact.  Speech clear. No gross focal motor or sensory deficits in extremities.; Skin: Color normal, Warm, Dry; Psych:  Affect flat, poor eye contact, denies HI, denies SI, no hallucinations currently.   ED Course  Procedures   MDM  MDM Reviewed: nursing note, previous chart and vitals Reviewed previous: ECG and labs Interpretation: ECG, labs, x-ray and CT scan    Date: 06/29/2012  Rate: 78  Rhythm: normal sinus rhythm and premature ventricular contractions (PVC)  QRS Axis: left  Intervals: normal  ST/T Wave abnormalities: normal  Conduction Disutrbances:nonspecific intraventricular conduction delay  Narrative Interpretation:   Old EKG Reviewed: unchanged; no significant changes from previous EKG dated 06/26/2012.  Results for orders placed during the hospital encounter of 06/29/12  COMPREHENSIVE METABOLIC PANEL      Component Value Range   Sodium 139  135 - 145 mEq/L   Potassium 4.3  3.5 - 5.1 mEq/L   Chloride 104  96 - 112 mEq/L   CO2 23  19 - 32 mEq/L   Glucose, Bld 94  70 - 99 mg/dL   BUN 15  6 - 23 mg/dL   Creatinine, Ser 1.61  0.50 - 1.10 mg/dL   Calcium 9.2  8.4 - 09.6 mg/dL   Total Protein 7.1  6.0 - 8.3 g/dL   Albumin 3.9  3.5 - 5.2 g/dL   AST 27  0 - 37 U/L   ALT 13  0 - 35 U/L   Alkaline Phosphatase 75  39 - 117 U/L   Total Bilirubin 0.8  0.3 - 1.2 mg/dL   GFR calc non Af Amer 77 (*) >90 mL/min   GFR calc Af Amer 89 (*) >90 mL/min  CBC WITH DIFFERENTIAL      Component Value Range   WBC 8.1  4.0 - 10.5 K/uL   RBC 4.45  3.87 - 5.11 MIL/uL   Hemoglobin 13.9  12.0 - 15.0 g/dL   HCT 04.5  40.9 - 81.1 %   MCV 90.8  78.0 - 100.0 fL   MCH 31.2   26.0 - 34.0 pg   MCHC 34.4  30.0 - 36.0 g/dL   RDW 91.4  78.2 - 95.6 %   Platelets 161  150 - 400 K/uL   Neutrophils Relative 69  43 - 77 %   Neutro Abs 5.6  1.7 - 7.7 K/uL   Lymphocytes Relative 25  12 - 46 %   Lymphs Abs 2.0  0.7 - 4.0 K/uL   Monocytes Relative 6  3 - 12 %   Monocytes Absolute 0.5  0.1 - 1.0 K/uL   Eosinophils Relative 1  0 - 5 %  Eosinophils Absolute 0.0  0.0 - 0.7 K/uL   Basophils Relative 1  0 - 1 %   Basophils Absolute 0.0  0.0 - 0.1 K/uL  URINALYSIS, ROUTINE W REFLEX MICROSCOPIC      Component Value Range   Color, Urine YELLOW  YELLOW   APPearance CLEAR  CLEAR   Specific Gravity, Urine 1.014  1.005 - 1.030   pH 5.5  5.0 - 8.0   Glucose, UA NEGATIVE  NEGATIVE mg/dL   Hgb urine dipstick TRACE (*) NEGATIVE   Bilirubin Urine NEGATIVE  NEGATIVE   Ketones, ur NEGATIVE  NEGATIVE mg/dL   Protein, ur NEGATIVE  NEGATIVE mg/dL   Urobilinogen, UA 0.2  0.0 - 1.0 mg/dL   Nitrite NEGATIVE  NEGATIVE   Leukocytes, UA NEGATIVE  NEGATIVE  TROPONIN I      Component Value Range   Troponin I <0.30  <0.30 ng/mL  ETHANOL      Component Value Range   Alcohol, Ethyl (B) <11  0 - 11 mg/dL  URINE RAPID DRUG SCREEN (HOSP PERFORMED)      Component Value Range   Opiates NONE DETECTED  NONE DETECTED   Cocaine NONE DETECTED  NONE DETECTED   Benzodiazepines NONE DETECTED  NONE DETECTED   Amphetamines NONE DETECTED  NONE DETECTED   Tetrahydrocannabinol NONE DETECTED  NONE DETECTED   Barbiturates NONE DETECTED  NONE DETECTED  URINE MICROSCOPIC-ADD ON      Component Value Range   Squamous Epithelial / LPF FEW (*) RARE   RBC / HPF 0-2  <3 RBC/hpf   Dg Chest 2 View 06/29/2012  *RADIOLOGY REPORT*  Clinical Data: Confusion.  CHEST - 2 VIEW  Comparison: 06/26/2012.  Findings: Trachea is midline.  Heart size stable.  Biapical pleural thickening.  Lungs are hyperinflated but otherwise clear.  No pleural fluid.  Lower thoracic compression fracture is again noted.  IMPRESSION: No acute  findings.  Original Report Authenticated By: Reyes Ivan, M.D.    Ct Head Wo Contrast 06/29/2012  *RADIOLOGY REPORT*  Clinical Data: Hallucinations.  CT HEAD WITHOUT CONTRAST  Technique:  Contiguous axial images were obtained from the base of the skull through the vertex without contrast.  Comparison: 06/26/2012.  Findings: Stable mild age related atrophy and periventricular white matter disease.  No acute intracranial findings or mass lesions. The bony structures are intact.  The paranasal sinuses mastoid air cells are clear.  IMPRESSION:  1.  Stable age related cerebral atrophy and periventricular white matter disease. 2.  No acute intracranial findings.  Original Report Authenticated By: P. Loralie Champagne, M.D.       1600:  Family states they cannot care for her at home "like this."  Has been eval in ED x3 for same.  No acute medial reason to admit.  VS remain stable.  Case Manager and ACT called, case discussed, will eval for possible [place] placement.  Pt and family updated and agreeable with plan.       Laray Anger, DO 06/29/12 1958

## 2012-06-29 NOTE — ED Notes (Addendum)
Per ems pt is alert and oriented x4, pt is from home. daughter is POA. ems reports that pt was seen in ED Saturday and Sunday for hallucinations. Pt believes that people are going to kill her. Pt at times does not recognize family and at packed her bags and "was ready to leave". The grandkid laid in front of the door so that the pt could not leave. Pt denies hallucinations.   Recently pts daughter died and daugthers husband committed suicide 2 months ago.

## 2012-06-29 NOTE — ED Notes (Signed)
WUJ:[id number]<JY> Expected date:06/29/12<BR> Expected time: 8:09 AM<BR> Means of arrival:<BR> Comments:<BR> closed

## 2012-06-29 NOTE — BH Assessment (Signed)
Assessment Note   Bethany Fox is a 78 y.o. female who presents to [facility] with depression.  Pt brought to ed voluntarily by [employer] and EMS.  Pt has been experiencing hallucinations the past weeks, says sees boars pulling her towards a grave.  The pt told this writer that her son-in-law committed suicide in 05/26/2012 and daughter died of massive heart attack last week.  Pt says she refused to accept daughter death and was traumatized by both deaths and has had a very difficult time.  Pt says she spoken with a priest from her daughter's [place] and he told her that the grieving process will take time.  Family has brought pt in to [facility] ED on 06/26/12, 06/27/12, 06/28/12 and 06/29/12 for recent hallucinations and conflict in the family regarding pt.'s recent behavior.  Upon each visit to the ed, the pt has recv'd a telepsych and was d/c'd home with meds.  Pt was prescribed risperdal and has only been taking it a few days.  Pt told this Clinical research associate she realizes that the hallucinations are a reaction to shock of the deaths and she refused to accept the sudden death of her daughter, pt.'s family is concerned about behavior.  This Clinical research associate spoke with Dr. Roselyn Bering regarding case, he says that pt doesn't require another telepsych or inpt admission with a facility and can be d/c'd 06/30/12, however contact will need to be made to determine if pt is eligible for tx.  Pt says prior to  these events she was not having any psychiatric problems and wants to return home, pt lives alone and care for self.  She is mobile and req no assistance with ADL's. Pt says she has some trouble remembering things but this doesn't hinder her day to day activities.  This Clinical research associate contacted [employer] [facility], spoke with Sallyanne Havers) who states that pt is not appropriate for inpt tx but the psych would have to review referral to make final decision.  This Clinical research associate also called [facility], spoke with Harrold Donath, who states pt may not be appropriate for inpt tx but  final decision will be made by psychiatrist. This Clinical research associate will relay information to EDP for further instruction.    Axis I: Depressive Disorder NOS Axis II: Deferred Axis III:  Past Medical History  Diagnosis Date  . Gastric ulcer   . Cancer     breast  . Depression    Axis IV: other psychosocial or environmental problems, problems related to social environment and problems with primary support group Axis V: 41-50 serious symptoms  Past Medical History:  Past Medical History  Diagnosis Date  . Gastric ulcer   . Cancer     breast  . Depression     Past Surgical History  Procedure Date  . Cholecystectomy   . Mastectomy     Family History: History reviewed. No pertinent family history.  Social History:  reports that she has never smoked. She does not have any smokeless tobacco history on file. She reports that she does not drink alcohol or use illicit drugs.  Additional Social History:  Alcohol / Drug Use Pain Medications: None  Prescriptions: Pls see current meds  Over the Counter: None  History of alcohol / drug use?: No history of alcohol / drug abuse Longest period of sobriety (when/how long): None   CIWA: CIWA-Ar BP: 170/75 mmHg Pulse Rate: 91  COWS:    Allergies:  Allergies  Allergen Reactions  . Ciprofloxacin Rash  Home Medications:  (Not in a hospital admission)  OB/GYN Status:  No LMP recorded. Patient has had a hysterectomy.  General Assessment Data Location of Assessment: [facility] ED Living Arrangements: Alone Can pt return to current living arrangement?: Yes Admission Status: Voluntary Is patient capable of signing voluntary admission?: Yes Transfer from: Acute Hospital Referral Source: MD  Education Status Is patient currently in school?: No Current Grade: None  Highest grade of school patient has completed: None Name of school: None  Contact person: None   Risk to self Suicidal Ideation: No Suicidal Intent: No Is patient at risk for  suicide?: No Suicidal Plan?: No Access to Means: No What has been your use of drugs/alcohol within the last 12 months?: Pt denies  Previous Attempts/Gestures: No How many times?: 0  Other Self Harm Risks: None  Triggers for Past Attempts: None known Intentional Self Injurious Behavior: None Family Suicide History: No Recent stressful life event(s): Conflict (Comment) (Family after son-in-law and daughter's death ) Persecutory voices/beliefs?: No Depression: Yes Depression Symptoms: Loss of interest in usual pleasures Substance abuse history and/or treatment for substance abuse?: No Suicide prevention information given to non-admitted patients: Not applicable  Risk to Others Homicidal Ideation: No Thoughts of Harm to Others: No Current Homicidal Intent: No Current Homicidal Plan: No Access to Homicidal Means: No Identified Victim: None  History of harm to others?: No Assessment of Violence: None Noted Violent Behavior Description: None  Does patient have access to weapons?: No Criminal Charges Pending?: No Describe Pending Criminal Charges: None  Does patient have a court date: No  Psychosis Hallucinations: Visual Delusions: None noted  Mental Status Report Appear/Hygiene: Other (Comment) (Appropriate ) Eye Contact: Good Motor Activity: Unremarkable Speech: Logical/coherent Level of Consciousness: Alert Mood: Depressed Affect: Depressed Anxiety Level: None Thought Processes: Coherent;Relevant Judgement: Unimpaired Orientation: Person;Place;Time;Situation Obsessive Compulsive Thoughts/Behaviors: None  Cognitive Functioning Concentration: Decreased Memory: Recent Intact;Remote Intact IQ: Average Insight: Good Impulse Control: Good Appetite: Good Weight Loss: 0  Weight Gain: 0  Sleep: No Change Total Hours of Sleep: 5  Vegetative Symptoms: None  ADLScreening [facility] Assessment Services) Patient's cognitive ability adequate to safely complete daily activities?:  Yes Patient able to express need for assistance with ADLs?: Yes Independently performs ADLs?: Yes  Abuse/Neglect [facility]) Physical Abuse: Denies Verbal Abuse: Denies Sexual Abuse: Denies  Prior Inpatient Therapy Prior Inpatient Therapy: No Prior Therapy Dates: None  Prior Therapy Facilty/Provider(s): None  Reason for Treatment: None   Prior Outpatient Therapy Prior Outpatient Therapy: No Prior Therapy Dates: None  Prior Therapy Facilty/Provider(s): None  Reason for Treatment: None   ADL Screening (condition at time of admission) Patient's cognitive ability adequate to safely complete daily activities?: Yes Patient able to express need for assistance with ADLs?: Yes Independently performs ADLs?: Yes Communication: Independent Dressing (OT): Independent Grooming: Independent Feeding: Independent Bathing: Independent Is this a change from baseline?: Pre-admission baseline Toileting: Independent In/Out Bed: Independent Walks in Home: Independent Weakness of Legs: None Weakness of Arms/Hands: None  Home Assistive Devices/Equipment Home Assistive Devices/Equipment: None  Therapy Consults (therapy consults require a physician order) PT Evaluation Needed: No OT Evalulation Needed: No SLP Evaluation Needed: No Abuse/Neglect Assessment (Assessment to be complete while patient is alone) Physical Abuse: Denies Verbal Abuse: Denies Sexual Abuse: Denies Exploitation of patient/patient's resources: Denies Self-Neglect: Denies Values / Beliefs Cultural Requests During Hospitalization: None Spiritual Requests During Hospitalization: None Consults Spiritual Care Consult Needed: No Social Work Consult Needed: No Merchant navy officer (For Healthcare) Advance Directive:  (Unknown if pt  has advance directives ) Pre-existing out of facility DNR order (yellow form or pink MOST form): No Nutrition Screen Diet: Regular Unintentional weight loss greater than 10lbs within the last month:  No Problems chewing or swallowing foods and/or liquids: No Home Tube Feeding or Total Parenteral Nutrition (TPN): No Patient appears severely malnourished: No Pregnant or Lactating: No  Additional Information 1:1 In Past 12 Months?: No CIRT Risk: No Elopement Risk: No Does patient have medical clearance?: Yes     Disposition:  Disposition Disposition of Patient: Outpatient treatment Type of outpatient treatment: Adult  On Site Evaluation by:   Reviewed with Physician:     Murrell Redden 06/29/2012 11:15 PM

## 2012-06-30 MED ORDER — BENEPROTEIN PO POWD
1.0000 | Freq: Every day | ORAL | Status: DC
  Filled 2012-06-30: qty 227

## 2012-06-30 MED ORDER — POLYETHYLENE GLYCOL 3350 17 G PO PACK
17.0000 g | PACK | Freq: Every day | ORAL | Status: DC
  Administered 2012-06-30: 17 g via ORAL
  Filled 2012-06-30: qty 1

## 2012-06-30 MED ORDER — DOCUSATE SODIUM 100 MG PO CAPS
100.0000 mg | ORAL_CAPSULE | Freq: Two times a day (BID) | ORAL | Status: DC
  Administered 2012-06-30 (×2): 100 mg via ORAL
  Filled 2012-06-30 (×2): qty 1

## 2012-06-30 MED ORDER — RISPERIDONE 0.5 MG PO TABS
0.2500 mg | ORAL_TABLET | Freq: Every day | ORAL | Status: DC
  Filled 2012-06-30: qty 1

## 2012-06-30 NOTE — ED Notes (Signed)
Patient's daughter Nicole Cella has been called and told about mother's transport to [place].

## 2012-06-30 NOTE — Discharge Planning (Signed)
Patient accepted by Dr. Janice Norrie to [facility]. Patient has been IVC and will be transported by [facility].  EDP and patient's nurse made aware. Sheriff has been contacted and will call back with the time. CSW asked patient's nurse to notify patient's daughter with time of patient's transport.  Manson Passey Maleni Seyer ANN S , MSW, LCSWA 06/30/2012   3:14 PM [phone]

## 2012-06-30 NOTE — ED Provider Notes (Addendum)
BP 171/73  Pulse 77  Temp 98.1 F (36.7 C) (Oral)  Resp 16  SpO2 98%  Patient seen and evaluated by me. No complaints at this time. Per ACT team, patient cleared to go home yesterday by Dr. Lynelle Doctor. This morning she states to me "Did you do it? Did you put that in the newspaper about me?". She lives independently and I do not think she's safe to go home without a supervision care plan in place.  ACT to discuss with her family.   Forbes Cellar, MD 06/30/12 1610  Forbes Cellar, MD 06/30/12 [phone]

## 2012-07-01 LAB — URINE CULTURE

## 2013-08-03 ENCOUNTER — Telehealth: Payer: Self-pay | Admitting: Family Medicine

## 2013-08-03 MED ORDER — RISPERIDONE 0.5 MG PO TABS: 0.5000 mg | ORAL_TABLET | Freq: Two times a day (BID) | ORAL | Status: DC

## 2013-08-03 NOTE — Telephone Encounter (Signed)
Medication refilled per protocol. 

## 2013-08-05 ENCOUNTER — Telehealth: Payer: Self-pay | Admitting: Family Medicine

## 2013-08-06 NOTE — Telephone Encounter (Signed)
Pharmacy called for clarification. I told them it is just 1 QHS and they are going to correct it for the patient.

## 2013-08-11 ENCOUNTER — Encounter: Payer: Self-pay | Admitting: Family Medicine

## 2013-10-11 ENCOUNTER — Encounter: Payer: Self-pay | Admitting: Family Medicine

## 2013-10-11 ENCOUNTER — Ambulatory Visit (INDEPENDENT_AMBULATORY_CARE_PROVIDER_SITE_OTHER): Payer: Medicare Other | Admitting: Family Medicine

## 2013-10-11 VITALS — BP 126/68 | HR 70 | Temp 98.1°F | Resp 16 | Wt 129.0 lb

## 2013-10-11 DIAGNOSIS — Z79899 Other long term (current) drug therapy: Secondary | ICD-10-CM

## 2013-10-11 DIAGNOSIS — F03918 Unspecified dementia, unspecified severity, with other behavioral disturbance: Secondary | ICD-10-CM

## 2013-10-11 DIAGNOSIS — D485 Neoplasm of uncertain behavior of skin: Secondary | ICD-10-CM

## 2013-10-11 DIAGNOSIS — F329 Major depressive disorder, single episode, unspecified: Secondary | ICD-10-CM

## 2013-10-11 DIAGNOSIS — F32A Depression, unspecified: Secondary | ICD-10-CM

## 2013-10-11 DIAGNOSIS — F0390 Unspecified dementia without behavioral disturbance: Secondary | ICD-10-CM | POA: Insufficient documentation

## 2013-10-11 DIAGNOSIS — F0391 Unspecified dementia with behavioral disturbance: Secondary | ICD-10-CM

## 2013-10-11 LAB — CBC WITH DIFFERENTIAL/PLATELET
Basophils Absolute: 0 10*3/uL (ref 0.0–0.1)
Eosinophils Relative: 1 % (ref 0–5)
HCT: 39.6 % (ref 36.0–46.0)
Hemoglobin: 13.6 g/dL (ref 12.0–15.0)
Lymphocytes Relative: 23 % (ref 12–46)
Lymphs Abs: 1.7 10*3/uL (ref 0.7–4.0)
MCV: 90.2 fL (ref 78.0–100.0)
Monocytes Absolute: 0.4 10*3/uL (ref 0.1–1.0)
Monocytes Relative: 6 % (ref 3–12)
Neutro Abs: 5 10*3/uL (ref 1.7–7.7)
RBC: 4.39 MIL/uL (ref 3.87–5.11)
RDW: 13.3 % (ref 11.5–15.5)
WBC: 7.2 10*3/uL (ref 4.0–10.5)

## 2013-10-11 LAB — COMPLETE METABOLIC PANEL WITH GFR
AST: 16 U/L (ref 0–37)
Alkaline Phosphatase: 76 U/L (ref 39–117)
BUN: 14 mg/dL (ref 6–23)
Creat: 0.62 mg/dL (ref 0.50–1.10)
GFR, Est Non African American: 77 mL/min
Glucose, Bld: 77 mg/dL (ref 70–99)
Potassium: 4.1 mEq/L (ref 3.5–5.3)
Total Bilirubin: 0.8 mg/dL (ref 0.3–1.2)

## 2013-10-11 NOTE — Progress Notes (Signed)
Subjective:    Patient ID: Bethany Fox, female    DOB: 1917-03-15, 79 y.o.   MRN: [id number]  HPI Patient is a very pleasant 79 year old white female who currently lives at [facility].  She has a history of dementia as well as depression. Her most recent episode depression stems from the death of her daughter. Her depression was complicated by hallucinations and delusions. She is currently on Effexor at 75 mg by mouth daily and return nausea 0.5 mg by mouth each bedtime. She is now sleeping good throughout the night. She has minimal hallucinations or delirium. Majority of her confusion stems from her dementia.  Her daughter has no concerns at the present time. They're aware of the increased stroke risk on antipsychotic medication there today to check her blood work.  She also has a 4 mm erythematous scaly papule on the left side of her nasal bridge. 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 She is unsure how long it has been there. Past Medical History  Diagnosis Date  . Gastric ulcer   . Cancer     breast  . Depression   . Dementia    Current Outpatient Prescriptions on File Prior to Visit  Medication Sig Dispense Refill  . docusate sodium (COLACE) 100 MG capsule Take 100 mg by mouth 2 (two) times daily.      . polyethylene glycol (MIRALAX / GLYCOLAX) packet Take 17 g by mouth daily.      . protein supplement (RESOURCE BENEPROTEIN) POWD Take 1 scoop by mouth daily.      . risperiDONE (RISPERDAL) 0.5 MG tablet Take 0.5 mg by mouth at bedtime.       No current facility-administered medications on file prior to visit.   Allergies  Allergen Reactions  . Ciprofloxacin Rash   History   Social History  . Marital Status: Widowed    Spouse Name: N/A    Number of Children: N/A  . Years of Education: N/A   Occupational History  . Not on file.   Social History Main Topics  . Smoking status: Never Smoker   . Smokeless tobacco: Not on file  .  Alcohol Use: No  . Drug Use: No  . Sexual Activity:    Other Topics Concern  . Not on file   Social History Narrative  . No narrative on file      Review of Systems  All other systems reviewed and are negative.       Objective:   Physical Exam  Vitals reviewed. Neck: Neck supple. No thyromegaly present.  Cardiovascular: Normal rate, regular rhythm and normal heart sounds.   No murmur heard. Pulmonary/Chest: Effort normal and breath sounds normal. She has no wheezes. She has no rales. She exhibits no tenderness.  Abdominal: Soft. Bowel sounds are normal. She exhibits no distension. There is no tenderness. There is no rebound.  Lymphadenopathy:    She has no cervical adenopathy.  Skin: There is erythema.  Psychiatric: She has a normal mood and affect. Her speech is normal and behavior is normal. Judgment and thought content normal. Cognition and memory are impaired.          Assessment & Plan:  High risk medication use - Plan: COMPLETE METABOLIC PANEL WITH GFR, CBC with Differential  Depression  Dementia with behavioral disturbance  Neoplasm of uncertain behavior of skin  For the present time we will continue the Effexor and Risperdal.  1 family about increased  stroke risk cholesterol. I will check a CBC and a CMP. Patient is nonfasting so cannot check a fasting lipid panel. I also treated the cancerous lesion on the left side of her nose cryotherapy using liquid nitrogen for a total of 20 seconds. Wound care was discussed. If the lesion persists I recommend excision.

## 2013-11-01 ENCOUNTER — Encounter (HOSPITAL_COMMUNITY): Payer: Self-pay | Admitting: Emergency Medicine

## 2013-11-01 ENCOUNTER — Emergency Department (HOSPITAL_COMMUNITY)
Admission: EM | Admit: 2013-11-01 | Discharge: 2013-11-01 | Disposition: A | Discharge status: Skilled Nursing Facility | Payer: Medicare Other | Attending: Emergency Medicine | Admitting: Emergency Medicine

## 2013-11-01 DIAGNOSIS — F911 Conduct disorder, childhood-onset type: Secondary | ICD-10-CM | POA: Insufficient documentation

## 2013-11-01 DIAGNOSIS — Z79899 Other long term (current) drug therapy: Secondary | ICD-10-CM | POA: Insufficient documentation

## 2013-11-01 DIAGNOSIS — F329 Major depressive disorder, single episode, unspecified: Secondary | ICD-10-CM | POA: Insufficient documentation

## 2013-11-01 DIAGNOSIS — F3289 Other specified depressive episodes: Secondary | ICD-10-CM | POA: Insufficient documentation

## 2013-11-01 DIAGNOSIS — Z853 Personal history of malignant neoplasm of breast: Secondary | ICD-10-CM | POA: Insufficient documentation

## 2013-11-01 DIAGNOSIS — F29 Unspecified psychosis not due to a substance or known physiological condition: Secondary | ICD-10-CM | POA: Insufficient documentation

## 2013-11-01 DIAGNOSIS — F0390 Unspecified dementia without behavioral disturbance: Secondary | ICD-10-CM | POA: Insufficient documentation

## 2013-11-01 DIAGNOSIS — Z8711 Personal history of peptic ulcer disease: Secondary | ICD-10-CM | POA: Insufficient documentation

## 2013-11-01 LAB — CBC WITH DIFFERENTIAL/PLATELET
Hemoglobin: 13 g/dL (ref 12.0–15.0)
Lymphocytes Relative: 31 % (ref 12–46)
Lymphs Abs: 2.3 10*3/uL (ref 0.7–4.0)
MCV: 92 fL (ref 78.0–100.0)
Monocytes Relative: 7 % (ref 3–12)
Neutrophils Relative %: 60 % (ref 43–77)
Platelets: 173 10*3/uL (ref 150–400)
RBC: 4.11 MIL/uL (ref 3.87–5.11)
WBC: 7.5 10*3/uL (ref 4.0–10.5)

## 2013-11-01 LAB — POCT I-STAT, CHEM 8
BUN: 17 mg/dL (ref 6–23)
Calcium, Ion: 1.14 mmol/L (ref 1.13–1.30)
Chloride: 105 mEq/L (ref 96–112)
Creatinine, Ser: 1 mg/dL (ref 0.50–1.10)
Glucose, Bld: 100 mg/dL — ABNORMAL HIGH (ref 70–99)

## 2013-11-01 LAB — URINALYSIS, ROUTINE W REFLEX MICROSCOPIC
Hgb urine dipstick: NEGATIVE
Specific Gravity, Urine: 1.024 (ref 1.005–1.030)
Urobilinogen, UA: 1 mg/dL (ref 0.0–1.0)
pH: 5.5 (ref 5.0–8.0)

## 2013-11-01 NOTE — ED Provider Notes (Signed)
CSN: [id number]     Arrival date & time 11/01/13  1918 History   First MD Initiated Contact with Patient 11/01/13 1952     Chief Complaint  Patient presents with  . Altered Mental Status   (Consider location/radiation/quality/duration/timing/severity/associated sxs/prior Treatment) HPI Comments: Pt with hx of dementia, but aox 3 forme comes in with cc of aggressive behavior. Per daughter, nursing home called them and states that patient was acting a little aggressive and appeared little confused - and that they think she might be having a UTI. Pt denies nausea, emesis, fevers, chills, chest pains, shortness of breath, headaches, abdominal pain, uti like symptoms, rashes.   Patient is a 79 y.o. female presenting with altered mental status. The history is provided by the patient.  Altered Mental Status Associated symptoms: no abdominal pain, no headaches, no nausea and no vomiting     Past Medical History  Diagnosis Date  . Gastric ulcer   . Cancer     breast  . Depression   . Dementia    Past Surgical History  Procedure Laterality Date  . Cholecystectomy    . Mastectomy     No family history on file. History  Substance Use Topics  . Smoking status: Never Smoker   . Smokeless tobacco: Not on file  . Alcohol Use: No   OB History   Grav Para Term Preterm Abortions TAB SAB Ect Mult Living                 Review of Systems  Constitutional: Negative for activity change.  Respiratory: Negative for shortness of breath.   Cardiovascular: Negative for chest pain.  Gastrointestinal: Negative for nausea, vomiting and abdominal pain.  Genitourinary: Negative for dysuria.  Musculoskeletal: Negative for neck pain.  Neurological: Negative for headaches.    Allergies  Ciprofloxacin  Home Medications   Current Outpatient Rx  Name  Route  Sig  Dispense  Refill  . docusate sodium (COLACE) 100 MG capsule   Oral   Take 100 mg by mouth 2 (two) times daily.         .  polyethylene glycol (MIRALAX / GLYCOLAX) packet   Oral   Take 17 g by mouth daily.         . risperiDONE (RISPERDAL) 0.5 MG tablet   Oral   Take 0.5 mg by mouth at bedtime.         Marland Kitchen venlafaxine (EFFEXOR) 37.5 MG tablet   Oral   Take 37.5 mg by mouth 2 (two) times daily.         . protein supplement (RESOURCE BENEPROTEIN) POWD   Oral   Take 1 scoop by mouth daily.          BP 158/71  Pulse 86  Temp(Src) 98 F (36.7 C) (Oral)  Resp 16  SpO2 98% Physical Exam  Nursing note and vitals reviewed. Constitutional: She is oriented to person, place, and time. She appears well-developed and well-nourished.  HENT:  Head: Normocephalic and atraumatic.  Eyes: EOM are normal. Pupils are equal, round, and reactive to light.  Neck: Neck supple.  Cardiovascular: Normal rate, regular rhythm and normal heart sounds.   No murmur heard. Pulmonary/Chest: Effort normal. No respiratory distress.  Abdominal: Soft. She exhibits no distension. There is no tenderness. There is no rebound and no guarding.  Neurological: She is alert and oriented to person, place, and time.  Skin: Skin is warm and dry.    ED Course  Procedures (including critical  care time) Labs Review Labs Reviewed  URINE CULTURE  URINALYSIS, ROUTINE W REFLEX MICROSCOPIC  CBC WITH DIFFERENTIAL   Imaging Review No results found.  EKG Interpretation   None       MDM  No diagnosis found.  Pt comes in with cc of altered mental status, however, she is aox 3 for me. Hx and exam not suggestive of any infection, however, we will get a screening UA and CBC, BMP. Pt has no chest pain, no cough - so we will not get troponin and ekg here, or CXR.  Derwood Kaplan, MD 11/01/13 2110

## 2013-11-01 NOTE — ED Notes (Signed)
Pt was brought in by ems due to nursing home and family states that she was confused more than normal that she has dementia and seemed to be more aggressive than normal. Pts nursing home wanted pt checked for an UTI. Pt refused to let staff apply leads and was not wanting staff to do anything to her. Daughter states that when daughter died approx 1 yr ago pt became like this and has not been the same since.

## 2013-11-02 LAB — URINE CULTURE
Colony Count: 4000
Special Requests: NORMAL

## 2013-12-22 ENCOUNTER — Ambulatory Visit (INDEPENDENT_AMBULATORY_CARE_PROVIDER_SITE_OTHER): Payer: Medicare Other | Admitting: Internal Medicine

## 2013-12-22 VITALS — BP 128/74 | HR 73 | Temp 101.5°F | Resp 17 | Ht 59.0 in | Wt 135.0 lb

## 2013-12-22 DIAGNOSIS — F0390 Unspecified dementia without behavioral disturbance: Secondary | ICD-10-CM

## 2013-12-22 DIAGNOSIS — S0990XA Unspecified injury of head, initial encounter: Secondary | ICD-10-CM

## 2013-12-22 NOTE — Patient Instructions (Signed)
No evidence for brain injury or bleeding from fall-continue to observe for return to normal level of activity

## 2013-12-22 NOTE — Progress Notes (Addendum)
   Subjective:    Patient ID: Bethany Fox, female    DOB: [birth year], 79 y.o.   MRN: [id number]  HPI brought in by daughter and granddaughter who were called by her nursing home today because she had a fall yesterday with uncertain injuries. They have noted a bruise on the back of her head and on her right arm She has a history of dementia and is on Risperdal and Effexor Her family has noticed today that she is not as active as usual although her sensorium does not appear to be different She was able to eat breakfast and lunch She seems a bit weaker than usual to them  She is unable to give a history of her fall and no one witnessed it to see if she lost consciousness  Patient Active Problem List   Diagnosis Date Noted  . Dementia   Current outpatient prescriptions:docusate sodium (COLACE) 100 MG capsule, Take 100 mg by mouth 2 (two) times daily., Disp: , Rfl: ;  polyethylene glycol (MIRALAX / GLYCOLAX) packet, Take 17 g by mouth daily., Disp: , Rfl: ;  protein supplement (RESOURCE BENEPROTEIN) POWD, Take 1 scoop by mouth daily., Disp: , Rfl: ;  risperiDONE (RISPERDAL) 0.5 MG tablet, Take 0.5 mg by mouth at bedtime., Disp: , Rfl:  venlafaxine (EFFEXOR) 37.5 MG tablet, Take 37.5 mg by mouth 2 (two) times daily., Disp: , Rfl:    Review of Systems No vision changes complained of No headache No complaint of not using an extremity No incontinence    Objective:   Physical Exam BP 128/74  Pulse 73  Temp(Src) 101.5 F (38.6 C) (Oral)  Resp 17  Ht 4\' 11"  (1.499 m)  Wt 135 lb (61.236 kg)  BMI 27.25 kg/m2  SpO2 96% Sitting in wheelchair able to focus on May Pupils equal round reactive to light and accommodation/EOMs conjugate There is a 3 cm ecchymoses on the right occiput without skull defect Neck has good range of motion and she is nontender to palpation over the thoracic and cervical spine Both shoulders move well Ecchymoses right mid humerus without bony tenderness Palpation of  all of her other joints do not produce pain Good range of motion of the knees/slight restriction of both hips but no pain Heart regular without murmur Lungs clear Able to stand without assistance/seems reluctant to walk more than a few steps in the room Cranial nerves II through XII intact Grips equal bilaterally Motor strength of the lower extremities symmetrical Deep tendon reflexes symmetrical Tries to answer questions but makes no sense       Assessment & Plan:  History of a fall with uncertainty about loss of consciousness Skull injury but with a fairly normal neurological except for her dementia Bruising of the right arm without limited motion  Seems prudent to continue observation to see if she resumes her regular activity level and to progress to further evaluation only if she shows signs of neurological compromise that are new or different  Addend-discussed fever with daughter//back to 98 without treatment now at [facility]. I saw no source with my exam.  They will f/u tomorrow at 12 if fever recurs, or she does not become more active

## 2013-12-23 ENCOUNTER — Ambulatory Visit (INDEPENDENT_AMBULATORY_CARE_PROVIDER_SITE_OTHER): Payer: Medicare Other | Admitting: Family Medicine

## 2013-12-23 ENCOUNTER — Ambulatory Visit: Payer: Medicare Other

## 2013-12-23 VITALS — BP 162/80 | HR 83 | Temp 98.0°F | Resp 16

## 2013-12-23 DIAGNOSIS — F29 Unspecified psychosis not due to a substance or known physiological condition: Secondary | ICD-10-CM

## 2013-12-23 DIAGNOSIS — R410 Disorientation, unspecified: Secondary | ICD-10-CM

## 2013-12-23 DIAGNOSIS — J189 Pneumonia, unspecified organism: Secondary | ICD-10-CM

## 2013-12-23 DIAGNOSIS — N390 Urinary tract infection, site not specified: Secondary | ICD-10-CM

## 2013-12-23 LAB — POCT UA - MICROSCOPIC ONLY
Casts, Ur, LPF, POC: NEGATIVE
Crystals, Ur, HPF, POC: NEGATIVE
Mucus, UA: POSITIVE
Yeast, UA: NEGATIVE

## 2013-12-23 LAB — POCT URINALYSIS DIPSTICK
Bilirubin, UA: NEGATIVE
Nitrite, UA: NEGATIVE
Urobilinogen, UA: >=8
pH, UA: 6.5

## 2013-12-23 MED ORDER — CEFDINIR 300 MG PO CAPS: 300.0000 mg | ORAL_CAPSULE | Freq: Two times a day (BID) | ORAL | Status: DC

## 2013-12-23 MED ORDER — CEFDINIR 300 MG PO CAPS: 300.0000 mg | ORAL_CAPSULE | Freq: Every day | ORAL | Status: DC

## 2013-12-23 NOTE — Patient Instructions (Addendum)
Start on omnicef antibiotic for possible UTI or chest infection.   I will be in touch with you regarding your urine culture and chest- x-ray report  OK to give these meds at [facility]:  omnicef 300 mg once daily for 10 days  Tylenol as needed every 6 hours per pt supply

## 2013-12-23 NOTE — Progress Notes (Signed)
[facility] [address], [place] [address] [phone]  Date:  12/23/2013   Name:  Bethany Fox   DOB:  1917-01-29   MRN:  [id number]  PCP:  Bethany Grosser, MD    Chief Complaint: Follow-up   History of Present Illness:  Bethany Fox is a 80 y.o. very pleasant female patient who presents with the following:  Here today for a recheck- she was seen yesterday 12/24 following a fall that occurred on 12/23. She had a temp of 101.5 here yesterday which resolved overnight. No further fever noted today Her daughter took her back to [facility] yesterday after clinic visit, then went to check on her later and found her on the floor again around 7pm.  Daughter and her husband helped to get Bethany Fox back into bed.  They are not sure of the mechanism of last night's fall.  She has complained of some pain in her back and leg, but they did not notice any apparent severe injury.  She did walk from the car into clinic today under her own power.    She first had a temp yesterday- 99.9 at [facility], and then 101.5 here.  She just had one tylenol last night, and nothing so far today.  She was noted to have increased confusion yesterday over her baseline dementia.  Her daughter had noticed her coughing some the last couple of days.  Otherwise they have not noticed any particular sx of illness.     Dr. Merla Riches had asked them to provide a urine sample today for analysis which they did.   [name] is cared for by her family, including [name] and [name] [phone]) who accompanied her here today  Creat clearance is approx 25 ml/min assuming ideal weight of 100 lbs (calculated from former adult height of 5')  Patient Active Problem List   Diagnosis Date Noted  . Dementia     Past Medical History  Diagnosis Date  . Gastric ulcer   . Cancer     breast  . Depression   . Dementia     Past Surgical History  Procedure  Laterality Date  . Cholecystectomy    . Mastectomy      History  Substance Use Topics  . Smoking status: Never Smoker   . Smokeless tobacco: Not on file  . Alcohol Use: No    No family history on file.  Allergies  Allergen Reactions  . Adhesive [Tape]   . Ciprofloxacin Rash    Medication list has been reviewed and updated.  Current Outpatient Prescriptions on File Prior to Visit  Medication Sig Dispense Refill  . docusate sodium (COLACE) 100 MG capsule Take 100 mg by mouth 2 (two) times daily.      . polyethylene glycol (MIRALAX / GLYCOLAX) packet Take 17 g by mouth daily.      . protein supplement (RESOURCE BENEPROTEIN) POWD Take 1 scoop by mouth daily.      . risperiDONE (RISPERDAL) 0.5 MG tablet Take 0.5 mg by mouth at bedtime.      Marland Kitchen venlafaxine (EFFEXOR) 37.5 MG tablet Take 37.5 mg by mouth 2 (two) times daily.       No current facility-administered medications on file prior to visit.    Review of Systems:  As per HPI- otherwise negative.   Physical Examination: Filed Vitals:   12/23/13 1259  BP: 169/76  Pulse: 83  Temp: 98 F (36.7 C)  Resp: 16   There were no vitals filed for this visit. There is no weight on file to calculate BMI. Ideal Body Weight:    GEN: WDWN, Non-toxic, A & O x 3, elderly lady in no distress.  Able to answer questions although she does make some statements unrelated to conversation.  Per her daughter these are baseline for her HEENT: Atraumatic, Normocephalic. Neck supple. No masses, No LAD.  Bilateral TM wnl, oropharynx normal.  PEERL,EOMI.   Ears and Nose: No external deformity. CV: RRR, No M/G/R. No JVD. No thrill. No extra heart sounds. PULM: CTA B, no wheezes, crackles, rhonchi. No retractions. No resp. distress. No accessory muscle use. ABD: S, NT, ND EXTR: No c/c/e NEURO Normal gait for age.  Stiff and slow but no evidence of acute leg or hip fracture PSYCH: Normally interactive. Conversant. Not depressed or anxious  appearing.  Calm demeanor.  Examined her back- no point tenderness.  She is sore in the left thigh, but both hips have good ROM without pain to suggest any fracture.    Results for orders placed in visit on 12/23/13  POCT UA - MICROSCOPIC ONLY      Result Value Range   WBC, Ur, HPF, POC 0-4     RBC, urine, microscopic 15-30     Bacteria, U Microscopic 1+     Mucus, UA pos     Epithelial cells, urine per micros 10-TNTC     Crystals, Ur, HPF, POC neg     Casts, Ur, LPF, POC neg     Yeast, UA neg    POCT URINALYSIS DIPSTICK      Result Value Range   Color, UA amber     Clarity, UA clear     Glucose, UA neg     Bilirubin, UA neg     Ketones, UA neg     Spec Grav, UA 1.020     Blood, UA mod     pH, UA 6.5     Protein, UA neg     Urobilinogen, UA >=8.0     Nitrite, UA neg     Leukocytes, UA Negative     [facility] reading (PRIMARY) by  Dr. Patsy Lager. CXR:  Possible LLL infiltrate  CHEST 2 VIEW  COMPARISON: 06/29/2012, 06/26/2012 [facility].  FINDINGS: Lateral image blurred by respiratory motion. Cardiac silhouette mildly enlarged but stable. Hilar and mediastinal contours otherwise unremarkable. Minimal streaky and patchy opacities in the right lower lobe. Mildly prominent bronchovascular markings diffusely and moderate central peribronchial thickening, more so than on the prior examinations. Lungs otherwise clear. No pleural effusions. Pulmonary vascularity normal. The prior left mastectomy and axillary node dissection. Degenerative changes throughout the thoracic spine.  IMPRESSION: 1. Mild right lower lobe bronchopneumonia superimposed upon moderate changes of acute bronchitis and/or asthma. 2. No acute cardiopulmonary disease otherwise. 3. Stable mild cardiomegaly without pulmonary edema.   Assessment and Plan: Confusion - Plan: POCT UA - Microscopic Only, POCT urinalysis dipstick, Urine culture, DG Chest 2 View, DISCONTINUED: cefdinir (OMNICEF) 300 MG  capsule  UTI (urinary tract infection) - Plan: cefdinir (OMNICEF) 300 MG capsule  CAP (community acquired pneumonia)  Possible UTI and likely pneumonia.  Given omnicef to cover both.  Will dose once daily as her creat clearance is less than 30  Her BP is up somewhat today.  Per daughter she does not have a history of HTN but her Bp will "spike" sometimes.  Discussed with her daughter and nurse who cares for  her at [facility]. Currently there are no orders to follow her VS, they are taken only in the event of a fall, etc.  Gave parameters to follow her BP and pulse for the next several days, to report to myself or her PCP.  Sent note on Epic to Dr. Tanya Nones so he can be aware of her current illness.  Plan recheck depending on her progress.  If they have any concerns please see [place] over the weekend.  Otherwise can see Dr. Tanya Nones or [place] early next week.  If worsening please let me know right away Also gave orders to allow prn tylenol at [facility]  Signed Abbe Amsterdam, MD  Nurse from [facility] called at 6:30 pm and gave me updated vitals: BP 140/90. Pulse 68.  Appreciate update, her VS are now more typical for her.    Meds ordered this encounter  Medications  . acetaminophen (TYLENOL) 500 MG tablet    Sig: Take 500 mg by mouth every 6 (six) hours as needed.  Marland Kitchen DISCONTD: cefdinir (OMNICEF) 300 MG capsule    Sig: Take 1 capsule (300 mg total) by mouth 2 (two) times daily.    Dispense:  20 capsule    Refill:  0  . cefdinir (OMNICEF) 300 MG capsule    Sig: Take 1 capsule (300 mg total) by mouth daily.    Dispense:  10 capsule    Refill:  0

## 2013-12-27 ENCOUNTER — Telehealth: Payer: Self-pay | Admitting: Radiology

## 2013-12-27 NOTE — Telephone Encounter (Signed)
Spoke to daughter. She states mother is improving some but has been exposed to influenza at the facility. Advised daughter to bring her back in if she worsens or if she does not improve on the . Daughter agrees. To you FYI

## 2013-12-28 ENCOUNTER — Encounter: Payer: Self-pay | Admitting: Family Medicine

## 2014-01-05 ENCOUNTER — Telehealth: Payer: Self-pay | Admitting: Family Medicine

## 2014-01-05 NOTE — Telephone Encounter (Signed)
Called and discussed with [name].  Her mom seems to be doing well as far as her breathing, but her confusion seems worse again for the last couple of days .  Discussed hematuria with negative culture.  We always need to consider bladder or kidney cancer in this case.  However given Bethany Fox's overall age and dementia I am not sure if undergoing a hematuria work- up is in her best interests.  Bethany Fox will think about this and discuss with the rest of the family.  In the meantime, plan to check her back soon to recheck her urine and evaluate her dementia

## 2014-01-06 ENCOUNTER — Ambulatory Visit: Payer: Medicare Other

## 2014-01-06 ENCOUNTER — Ambulatory Visit (INDEPENDENT_AMBULATORY_CARE_PROVIDER_SITE_OTHER): Payer: Medicare Other | Admitting: Family Medicine

## 2014-01-06 VITALS — BP 114/72 | HR 79 | Temp 98.6°F | Resp 16 | Ht <= 58 in | Wt 136.0 lb

## 2014-01-06 DIAGNOSIS — J189 Pneumonia, unspecified organism: Secondary | ICD-10-CM

## 2014-01-06 DIAGNOSIS — F03918 Unspecified dementia, unspecified severity, with other behavioral disturbance: Secondary | ICD-10-CM

## 2014-01-06 DIAGNOSIS — F0391 Unspecified dementia with behavioral disturbance: Secondary | ICD-10-CM

## 2014-01-06 DIAGNOSIS — R319 Hematuria, unspecified: Secondary | ICD-10-CM

## 2014-01-06 LAB — POCT UA - MICROSCOPIC ONLY
CASTS, UR, LPF, POC: NEGATIVE
CRYSTALS, UR, HPF, POC: NEGATIVE
Mucus, UA: POSITIVE
Yeast, UA: NEGATIVE

## 2014-01-06 LAB — POCT URINALYSIS DIPSTICK
Bilirubin, UA: NEGATIVE
GLUCOSE UA: NEGATIVE
KETONES UA: NEGATIVE
Nitrite, UA: NEGATIVE
PROTEIN UA: NEGATIVE
SPEC GRAV UA: 1.01
UROBILINOGEN UA: 0.2
pH, UA: 5

## 2014-01-06 NOTE — Progress Notes (Addendum)
[facility] [address] [place] [address] [phone]  Date:  01/06/2014   Name:  Bethany Fox   DOB:  1917-11-12   MRN:  [id number]  PCP:  Bethany Fraction, MD    Chief Complaint: Follow-up   History of Present Illness:  Bethany Fox is a 80 y.o. very pleasant female patient who presents with the following:  Here today to follow-up pneumonia and hematuria. Also, [name] who is here with her today is concerned about her dementia and is interested in having some further evaluation of this.  Right now she is assisted living at [facility].  He daughter does not know if they might need to move Bethany Fox to memory care.   Overall Bethany Fox's memory is getting steadily worse.  She has good days and bad days, but has been noted to have hallucinations, difficulty with self -care such as using the bathroom, and other odd behaviors. The situation is awkward because [name] is here with her today; it is [name] who is generally in charge of Bethany Fox's care but she herself is very ill with advanced cancer.  Right now Bethany Fox is not able to be as involved with Bethany Fox's care but she is having a hard time with yielding decision making to [place].  "Bethany Fox.  She feels very guilty about not being as involved."    Went through some basic memory and orientation questions with Bethany Fox She is able to identify the month but not the day of the week.  Does not know the year.   Not able to identify a wristwatch but can identify a pen Her clock face drawing is circular, but has no numbers.  She makes non- number marks around the face of the clock  Bethany Fox describes times when her Fox will call because she is worried about the strangers in her room (when there is no- one there), and she will sometimes be confused about who in the family is living or deceased.  She may do strange things with her  belongings, and has wandered out of [facility] once (she was found and did not come to any harm).    Patient Active Problem List   Diagnosis Date Noted  . Dementia     Past Medical History  Diagnosis Date  . Gastric ulcer   . Cancer     breast  . Depression   . Dementia     Past Surgical History  Procedure Laterality Date  . Cholecystectomy    . Mastectomy      History  Substance Use Topics  . Smoking status: Never Smoker   . Smokeless tobacco: Not on file  . Alcohol Use: No    No family history on file.  Allergies  Allergen Reactions  . Adhesive [Tape]   . Ciprofloxacin Rash    Medication list has been reviewed and updated.  Current Outpatient Prescriptions on File Prior to Visit  Medication Sig Dispense Refill  . acetaminophen (TYLENOL) 500 MG tablet Take 500 mg by mouth every 6 (six) hours as needed.      . cefdinir (OMNICEF) 300 MG capsule Take 1 capsule (300 mg total) by mouth daily.  10 capsule  0  . docusate sodium (COLACE) 100 MG capsule Take 100 mg by mouth 2 (two) times daily.      . polyethylene glycol (MIRALAX / GLYCOLAX) packet Take 17 g by  mouth daily.      . protein supplement (RESOURCE BENEPROTEIN) POWD Take 1 scoop by mouth daily.      . risperiDONE (RISPERDAL) 0.5 MG tablet Take 0.5 mg by mouth at bedtime.      Marland Kitchen venlafaxine (EFFEXOR) 37.5 MG tablet Take 37.5 mg by mouth 2 (two) times daily.       No current facility-administered medications on file prior to visit.    Review of Systems:  As per HPI- otherwise negative.   Physical Examination: Filed Vitals:   01/06/14 1511  BP: 114/72  Pulse: 79  Temp: 98.6 F (37 C)  Resp: 16   Filed Vitals:   01/06/14 1511  Height: 4\' 8"  (1.422 m)  Weight: 136 lb (61.689 kg)   Body mass index is 30.51 kg/(m^2). Ideal Body Weight: Weight in (lb) to have BMI = 25: 111.3  GEN: WDWN, NAD, Non-toxic, Alert, looks well.  Quiet.   HEENT: Atraumatic, Normocephalic. Neck supple. No masses, No  LAD. Ears and Nose: No external deformity. CV: RRR, No M/G/R. No JVD. No thrill. No extra heart sounds. PULM: CTA B, no wheezes, crackles, rhonchi. No retractions. No resp. distress. No accessory muscle use. ABD: S, NT, ND, +BS. No rebound. No HSM. EXTR: No c/c/e NEURO Normal gait.  PSYCH: quiet and calm.  Tries to answer questions but clearly having a hard time with her memory.    [facility] reading (PRIMARY) by  Dr. Lorelei Fox. CXR: compare with CXR from 12/25: it appears that her pneumonia has cleared.   CHEST 2 VIEW  COMPARISON: 12/23/2013  FINDINGS: Opacity noted in the right lower lobe on the prior study has resolved. There is no evidence of residual pneumonia.  Lungs remain hyperexpanded. The heart, mediastinum and hila are unremarkable. Changes from a left mastectomy are stable. The bony thorax is diffusely demineralized there is a mild compression deformity of L1, stable.  IMPRESSION: Resolve right lower lobe pneumonia. No acute cardiopulmonary disease.  Results for orders placed in visit on 01/06/14  POCT UA - MICROSCOPIC ONLY      Result Value Range   WBC, Ur, HPF, POC 3-7     RBC, urine, microscopic 3-4     Bacteria, U Microscopic 1+     Mucus, UA pos     Epithelial cells, urine per micros 1-2     Crystals, Ur, HPF, POC neg     Casts, Ur, LPF, POC neg     Yeast, UA neg    POCT URINALYSIS DIPSTICK      Result Value Range   Color, UA yellow     Clarity, UA clear     Glucose, UA neg     Bilirubin, UA neg     Ketones, UA neg     Spec Grav, UA 1.010     Blood, UA trace     pH, UA 5.0     Protein, UA neg     Urobilinogen, UA 0.2     Nitrite, UA neg     Leukocytes, UA Trace      Assessment and Plan: Hematuria - Plan: POCT UA - Microscopic Only, POCT urinalysis dipstick, Urine culture  Pneumonia - Plan: DG Chest 2 View  Dementia with behavioral disturbance  Pneumonia is resolved.  At this time suspect her urine culture will be negative today.  Await culture  prior to repeating abx.  Discussed with Bethany Fox.  I agree with her that Bethany Fox should be in memory care.  I am concerned that  she could wander and be exposed to cold temperatures this winter or otherwise be hurt.  Bethany Fox will discuss this with her sister.  Also she has asked me to touch base with Bethany Fox's trusted FP Dr. Dennard Schaumann who also is familiar with the family.  I will do so.    Signed Lamar Blinks, MD  Called and spoke with Bethany Fox.  Urine culture is negative.  She does have unexplained microhematuria which could indicate cancer; however given Bethany Fox's age and dementia I am not sure if further work- up is in her best interests.  Bethany Fox agrees although she will touch base with her sister to be sure all are in agreement.  She also mentions that there is a family history of benign hematuria.

## 2014-01-07 LAB — URINE CULTURE
COLONY COUNT: NO GROWTH
ORGANISM ID, BACTERIA: NO GROWTH

## 2014-01-14 ENCOUNTER — Other Ambulatory Visit: Payer: Self-pay | Admitting: Family Medicine

## 2014-01-15 NOTE — Telephone Encounter (Signed)
?   Ok to refill; do not see where pt was on this med in her list, last ov was 10/11/13

## 2014-01-17 NOTE — Telephone Encounter (Signed)
Denied, I have no record of this medicine.

## 2014-01-18 ENCOUNTER — Other Ambulatory Visit: Payer: Self-pay | Admitting: Family Medicine

## 2014-01-19 NOTE — Telephone Encounter (Signed)
?   OK to Refill  

## 2014-01-20 NOTE — Telephone Encounter (Signed)
This wasn't on her med list the SNF sent me.  Can we verify that she has been using this?  If she has, I am fine with a refill just make sure its on her med list.

## 2014-01-25 NOTE — Telephone Encounter (Signed)
If patient in SNF, they are supplying medications.  Refill denied

## 2014-01-27 ENCOUNTER — Other Ambulatory Visit: Payer: Self-pay | Admitting: Family Medicine

## 2014-01-27 MED ORDER — RISPERIDONE 0.5 MG PO TABS: 0.5000 mg | ORAL_TABLET | Freq: Every day | ORAL | Status: DC

## 2014-02-08 ENCOUNTER — Ambulatory Visit (INDEPENDENT_AMBULATORY_CARE_PROVIDER_SITE_OTHER): Payer: Medicare Other | Admitting: Family Medicine

## 2014-02-08 ENCOUNTER — Encounter: Payer: Self-pay | Admitting: Family Medicine

## 2014-02-08 VITALS — BP 132/74 | HR 80 | Temp 98.0°F | Resp 16 | Ht <= 58 in | Wt 136.0 lb

## 2014-02-08 DIAGNOSIS — M7989 Other specified soft tissue disorders: Secondary | ICD-10-CM

## 2014-02-08 NOTE — Progress Notes (Signed)
Subjective:    Patient ID: Bethany Fox, female    DOB: [birth year], 80 y.o.   MRN: [id number]  HPI Patient is here today for routine followup. She is currently on his eyeball 0.25 mg by mouth each bedtime and venlafaxine 37.5 mg 2 tablets at lunchtime. Unfortunately, they're not dosing the venlafaxine twice a day.  However the patient is doing well. Her daughter, Earlie Server, accompanies her today and states that she is not having any episodes of agitation. Although confused, she is not combative. Her depression seems well controlled. Unfortunately she's noticed swelling in her legs and left arm. She has a history of a left mastectomy which is cause unilateral swelling in her left arm for the last 20-30 years. He is more pronounced today. She has +1 edema in both legs to the level of the knee. Her most recent weights are listed below: Wt Readings from Last 4 Encounters:  02/08/14 136 lb (61.689 kg)  01/06/14 136 lb (61.689 kg)  12/22/13 135 lb (61.236 kg)  10/11/13 129 lb (58.514 kg)   She denies any chest pain shortness of breath or dyspnea on exertion. She denies any urinary difficulty. She is not jaundiced. There is no evidence of ascites on evaluation. Past Medical History  Diagnosis Date  . Gastric ulcer   . Cancer     breast  . Depression   . Dementia    Current Outpatient Prescriptions on File Prior to Visit  Medication Sig Dispense Refill  . acetaminophen (TYLENOL) 500 MG tablet Take 500 mg by mouth every 6 (six) hours as needed.      . docusate sodium (COLACE) 100 MG capsule Take 100 mg by mouth 2 (two) times daily.      . polyethylene glycol (MIRALAX / GLYCOLAX) packet Take 17 g by mouth daily.      . protein supplement (RESOURCE BENEPROTEIN) POWD Take 1 scoop by mouth daily.      Marland Kitchen venlafaxine (EFFEXOR) 37.5 MG tablet Take 37.5 mg by mouth 2 (two) times daily.       No current facility-administered medications on file prior to visit.   Allergies  Allergen Reactions  .  Adhesive [Tape]   . Ciprofloxacin Rash   History   Social History  . Marital Status: Widowed    Spouse Name: N/A    Number of Children: N/A  . Years of Education: N/A   Occupational History  . Not on file.   Social History Main Topics  . Smoking status: Never Smoker   . Smokeless tobacco: Not on file  . Alcohol Use: No  . Drug Use: No  . Sexual Activity:    Other Topics Concern  . Not on file   Social History Narrative  . No narrative on file      Review of Systems  All other systems reviewed and are negative.       Objective:   Physical Exam  Vitals reviewed. Constitutional: She appears well-developed and well-nourished. No distress.  Eyes: Conjunctivae are normal. No scleral icterus.  Neck: Neck supple. No JVD present.  Cardiovascular: Normal rate, regular rhythm and normal heart sounds.   No murmur heard. Pulmonary/Chest: Effort normal and breath sounds normal. No respiratory distress. She has no wheezes. She has no rales. She exhibits no tenderness.  Abdominal: Soft. Bowel sounds are normal. She exhibits no distension. There is no tenderness. There is no rebound and no guarding.  Musculoskeletal: She exhibits edema.  Lymphadenopathy:    She has  no cervical adenopathy.  Skin: She is not diaphoretic.   Patient is pleasant but confused.  She is alert and oriented x1. She refuses Prevnar 13.       Assessment & Plan:  1. Leg swelling Examination shows no evidence of congestive heart failure. Furthermore she is asymptomatic for congestive heart failure. I believe she is eating high sodium diet. Therefore I have recommended a low-sodium diet. I have asked the nursing home to reweigh  the patient in one week to see if her weight is improving. If she continues to experience swelling we may want to obtain a 2-D echocardiogram of the heart. I'll evaluate her hepatic and renal function with a CMP. - CBC with Differential - COMPLETE METABOLIC PANEL WITH GFR

## 2014-02-09 LAB — COMPLETE METABOLIC PANEL WITH GFR
ALBUMIN: 3.5 g/dL (ref 3.5–5.2)
ALK PHOS: 79 U/L (ref 39–117)
ALT: 12 U/L (ref 0–35)
AST: 17 U/L (ref 0–37)
BILIRUBIN TOTAL: 0.5 mg/dL (ref 0.2–1.2)
BUN: 13 mg/dL (ref 6–23)
CO2: 27 mEq/L (ref 19–32)
Calcium: 8.9 mg/dL (ref 8.4–10.5)
Chloride: 105 mEq/L (ref 96–112)
Creat: 0.66 mg/dL (ref 0.50–1.10)
GFR, EST NON AFRICAN AMERICAN: 76 mL/min
GFR, Est African American: 87 mL/min
GLUCOSE: 109 mg/dL — AB (ref 70–99)
POTASSIUM: 3.9 meq/L (ref 3.5–5.3)
SODIUM: 140 meq/L (ref 135–145)
TOTAL PROTEIN: 6.3 g/dL (ref 6.0–8.3)

## 2014-02-09 LAB — CBC WITH DIFFERENTIAL/PLATELET
BASOS ABS: 0 10*3/uL (ref 0.0–0.1)
BASOS PCT: 0 % (ref 0–1)
EOS ABS: 0.1 10*3/uL (ref 0.0–0.7)
Eosinophils Relative: 1 % (ref 0–5)
HCT: 35.4 % — ABNORMAL LOW (ref 36.0–46.0)
Hemoglobin: 12.1 g/dL (ref 12.0–15.0)
Lymphocytes Relative: 27 % (ref 12–46)
Lymphs Abs: 2 10*3/uL (ref 0.7–4.0)
MCH: 30.9 pg (ref 26.0–34.0)
MCHC: 34.2 g/dL (ref 30.0–36.0)
MCV: 90.3 fL (ref 78.0–100.0)
Monocytes Absolute: 0.5 10*3/uL (ref 0.1–1.0)
Monocytes Relative: 6 % (ref 3–12)
NEUTROS ABS: 5 10*3/uL (ref 1.7–7.7)
NEUTROS PCT: 66 % (ref 43–77)
PLATELETS: 193 10*3/uL (ref 150–400)
RBC: 3.92 MIL/uL (ref 3.87–5.11)
RDW: 14.5 % (ref 11.5–15.5)
WBC: 7.6 10*3/uL (ref 4.0–10.5)

## 2014-02-10 ENCOUNTER — Encounter: Payer: Self-pay | Admitting: Family Medicine

## 2014-04-18 ENCOUNTER — Emergency Department (HOSPITAL_COMMUNITY): Payer: Medicare Other

## 2014-04-18 ENCOUNTER — Encounter (HOSPITAL_COMMUNITY): Payer: Self-pay | Admitting: Emergency Medicine

## 2014-04-18 ENCOUNTER — Emergency Department (HOSPITAL_COMMUNITY)
Admission: EM | Admit: 2014-04-18 | Discharge: 2014-04-18 | Disposition: A | Discharge status: Home / Self Care | Payer: Medicare Other | Attending: Emergency Medicine | Admitting: Emergency Medicine

## 2014-04-18 DIAGNOSIS — S0003XA Contusion of scalp, initial encounter: Secondary | ICD-10-CM | POA: Insufficient documentation

## 2014-04-18 DIAGNOSIS — Z79899 Other long term (current) drug therapy: Secondary | ICD-10-CM | POA: Insufficient documentation

## 2014-04-18 DIAGNOSIS — Y9210 Unspecified residential institution as the place of occurrence of the external cause: Secondary | ICD-10-CM | POA: Insufficient documentation

## 2014-04-18 DIAGNOSIS — Z853 Personal history of malignant neoplasm of breast: Secondary | ICD-10-CM | POA: Insufficient documentation

## 2014-04-18 DIAGNOSIS — W19XXXA Unspecified fall, initial encounter: Secondary | ICD-10-CM | POA: Insufficient documentation

## 2014-04-18 DIAGNOSIS — Z8719 Personal history of other diseases of the digestive system: Secondary | ICD-10-CM | POA: Insufficient documentation

## 2014-04-18 DIAGNOSIS — Z9010 Acquired absence of unspecified breast and nipple: Secondary | ICD-10-CM | POA: Insufficient documentation

## 2014-04-18 DIAGNOSIS — F3289 Other specified depressive episodes: Secondary | ICD-10-CM | POA: Insufficient documentation

## 2014-04-18 DIAGNOSIS — F329 Major depressive disorder, single episode, unspecified: Secondary | ICD-10-CM | POA: Insufficient documentation

## 2014-04-18 DIAGNOSIS — F03918 Unspecified dementia, unspecified severity, with other behavioral disturbance: Secondary | ICD-10-CM | POA: Insufficient documentation

## 2014-04-18 DIAGNOSIS — Y939 Activity, unspecified: Secondary | ICD-10-CM | POA: Insufficient documentation

## 2014-04-18 DIAGNOSIS — Y92129 Unspecified place in nursing home as the place of occurrence of the external cause: Secondary | ICD-10-CM

## 2014-04-18 DIAGNOSIS — S1093XA Contusion of unspecified part of neck, initial encounter: Principal | ICD-10-CM

## 2014-04-18 DIAGNOSIS — S0083XA Contusion of other part of head, initial encounter: Principal | ICD-10-CM | POA: Insufficient documentation

## 2014-04-18 DIAGNOSIS — F0391 Unspecified dementia with behavioral disturbance: Secondary | ICD-10-CM | POA: Insufficient documentation

## 2014-04-18 HISTORY — DX: Malignant neoplasm of unspecified site of unspecified female breast: C50.919

## 2014-04-18 HISTORY — DX: Acquired absence of unspecified breast and nipple: Z90.10

## 2014-04-18 LAB — BASIC METABOLIC PANEL
BUN: 13 mg/dL (ref 6–23)
CO2: 26 mEq/L (ref 19–32)
Calcium: 8.9 mg/dL (ref 8.4–10.5)
Chloride: 102 mEq/L (ref 96–112)
Creatinine, Ser: 0.66 mg/dL (ref 0.50–1.10)
GFR, EST AFRICAN AMERICAN: 84 mL/min — AB (ref >90)
GFR, EST NON AFRICAN AMERICAN: 73 mL/min — AB (ref >90)
Glucose, Bld: 113 mg/dL — ABNORMAL HIGH (ref 70–99)
POTASSIUM: 3.2 meq/L — AB (ref 3.7–5.3)
Sodium: 139 mEq/L (ref 137–147)

## 2014-04-18 LAB — CBC WITH DIFFERENTIAL/PLATELET
BASOS PCT: 1 % (ref 0–1)
Basophils Absolute: 0 10*3/uL (ref 0.0–0.1)
EOS ABS: 0 10*3/uL (ref 0.0–0.7)
EOS PCT: 1 % (ref 0–5)
HCT: 37.9 % (ref 36.0–46.0)
Hemoglobin: 13.1 g/dL (ref 12.0–15.0)
Lymphocytes Relative: 17 % (ref 12–46)
Lymphs Abs: 1.2 10*3/uL (ref 0.7–4.0)
MCH: 30.9 pg (ref 26.0–34.0)
MCHC: 34.6 g/dL (ref 30.0–36.0)
MCV: 89.4 fL (ref 78.0–100.0)
MONOS PCT: 9 % (ref 3–12)
Monocytes Absolute: 0.6 10*3/uL (ref 0.1–1.0)
NEUTROS PCT: 73 % (ref 43–77)
Neutro Abs: 5.3 10*3/uL (ref 1.7–7.7)
PLATELETS: 141 10*3/uL — AB (ref 150–400)
RBC: 4.24 MIL/uL (ref 3.87–5.11)
RDW: 14.6 % (ref 11.5–15.5)
WBC: 7.2 10*3/uL (ref 4.0–10.5)

## 2014-04-18 LAB — URINALYSIS, ROUTINE W REFLEX MICROSCOPIC
BILIRUBIN URINE: NEGATIVE
Glucose, UA: NEGATIVE mg/dL
KETONES UR: NEGATIVE mg/dL
Leukocytes, UA: NEGATIVE
Nitrite: NEGATIVE
Protein, ur: NEGATIVE mg/dL
Specific Gravity, Urine: 1.013 (ref 1.005–1.030)
UROBILINOGEN UA: 1 mg/dL (ref 0.0–1.0)
pH: 6 (ref 5.0–8.0)

## 2014-04-18 LAB — I-STAT TROPONIN, ED: Troponin i, poc: 0 ng/mL (ref 0.00–0.08)

## 2014-04-18 LAB — URINE MICROSCOPIC-ADD ON

## 2014-04-18 NOTE — ED Notes (Signed)
Patient arrives via [facility] from [facility] due to fall about 1.5 hours PTA--fall was NOT witnessed, so unsure of LOC Hematomas noted to back of head Patient fall on her left side, up against a chair Patient with hx of dementia

## 2014-04-18 NOTE — ED Notes (Signed)
Bed: RD40 Expected date:  Expected time:  Means of arrival:  Comments: EMS/elderly fall-hematoma to head

## 2014-04-18 NOTE — ED Notes (Signed)
Patient transported to X-ray 

## 2014-04-18 NOTE — ED Provider Notes (Signed)
CSN: [id number]     Arrival date & time 04/18/14  0028 History   First MD Initiated Contact with Patient 04/18/14 0117     Chief Complaint  Patient presents with  . Fall     (Consider location/radiation/quality/duration/timing/severity/associated sxs/prior Treatment) HPI Comments: This is a 80 year old female, who is with him in the care unit at [facility] was found on the floor.  Today.  Approximately 2 hours prior to arrival.  She does have a small hematoma on the posterior scalp, but per report, has been altered throughout the day.  She's had no reported episodes of nausea, vomiting, diarrhea, fever Level  5 caveat applies  Patient is a 80 y.o. female presenting with fall and altered mental status. The history is provided by the EMS personnel and the nursing home.  Fall This is a new problem. The current episode started today. The problem has been unchanged. Pertinent negatives include no chills or fever.  Altered Mental Status Presenting symptoms: behavior changes, confusion and lethargy   Severity:  Moderate Most recent episode:  Today Timing:  Constant Progression:  Worsening Chronicity:  New Associated symptoms: no fever     Past Medical History  Diagnosis Date  . Gastric ulcer   . Cancer     breast  . Depression   . Dementia   . Breast cancer   . H/O mastectomy     Left side    Past Surgical History  Procedure Laterality Date  . Cholecystectomy    . Mastectomy     History reviewed. No pertinent family history. History  Substance Use Topics  . Smoking status: Never Smoker   . Smokeless tobacco: Not on file  . Alcohol Use: No   OB History   Grav Para Term Preterm Abortions TAB SAB Ect Mult Living                 Review of Systems  Unable to perform ROS: Mental status change  Constitutional: Negative for fever and chills.  Psychiatric/Behavioral: Positive for confusion.  All other systems reviewed and are negative.     Allergies  Adhesive and  Ciprofloxacin  Home Medications   Prior to Admission medications   Medication Sig Start Date End Date Taking? Authorizing Provider  acetaminophen (TYLENOL) 500 MG tablet Take 500 mg by mouth every 6 (six) hours as needed.    Historical Provider, MD  docusate sodium (COLACE) 100 MG capsule Take 100 mg by mouth 2 (two) times daily.    Historical Provider, MD  polyethylene glycol (MIRALAX / GLYCOLAX) packet Take 17 g by mouth daily.    Historical Provider, MD  protein supplement (RESOURCE BENEPROTEIN) POWD Take 1 scoop by mouth daily.    Historical Provider, MD  risperiDONE (RISPERDAL) 0.25 MG tablet Take 0.25 mg by mouth daily.    Historical Provider, MD  venlafaxine (EFFEXOR) 37.5 MG tablet Take 37.5 mg by mouth 2 (two) times daily.    Historical Provider, MD   BP 165/58  Pulse 85  Temp(Src) 98.3 F (36.8 C) (Oral)  Resp 19  SpO2 93% Physical Exam  Nursing note and vitals reviewed. Constitutional: She appears well-developed. No distress.  HENT:  Head: Normocephalic.  Right Ear: External ear normal.  Left Ear: External ear normal.  Mouth/Throat: Oropharynx is clear and moist.  Eyes: Pupils are equal, round, and reactive to light.  Neck: Normal range of motion. No spinous process tenderness and no muscular tenderness present. Normal range of motion present.  Cardiovascular: Normal  rate and regular rhythm.   Pulmonary/Chest: Effort normal and breath sounds normal.  Abdominal: Soft. Bowel sounds are normal.  Musculoskeletal: Normal range of motion. She exhibits no edema and no tenderness.  Lymphadenopathy:    She has no cervical adenopathy.  Neurological: She is alert.  Skin: Skin is warm and dry. No erythema.  Small hematoma, left occipital area    ED Course  Procedures (including critical care time) Labs Review Labs Reviewed  URINALYSIS, ROUTINE W REFLEX MICROSCOPIC - Abnormal; Notable for the following:    Hgb urine dipstick MODERATE (*)    All other components within  normal limits  CBC WITH DIFFERENTIAL - Abnormal; Notable for the following:    Platelets 141 (*)    All other components within normal limits  BASIC METABOLIC PANEL - Abnormal; Notable for the following:    Potassium 3.2 (*)    Glucose, Bld 113 (*)    GFR calc non Af Amer 73 (*)    GFR calc Af Amer 84 (*)    All other components within normal limits  URINE MICROSCOPIC-ADD ON  I-STAT TROPOININ, ED    Imaging Review Dg Chest 2 View (if Patient Has Fever And/or Copd)  04/18/2014   CLINICAL DATA:  Cough, shortness of breath, weakness. History of pneumonia. Fall.  EXAM: CHEST  2 VIEW  COMPARISON:  DG CHEST 2V dated 01/06/2014  FINDINGS: Shallow inspiration. The heart size and mediastinal contours are within normal limits. Both lungs are clear. The visualized skeletal structures are unremarkable. Surgical clips in the left axilla.  IMPRESSION: No active cardiopulmonary disease.   Electronically Signed   By: Lucienne Capers M.D.   On: 04/18/2014 01:29   Ct Head Wo Contrast  04/18/2014   CLINICAL DATA:  Patient fell striking the left side on a chair. Hematoma to the posterior head. History of breast cancer and a dementia.  EXAM: CT HEAD WITHOUT CONTRAST  TECHNIQUE: Contiguous axial images were obtained from the base of the skull through the vertex without intravenous contrast.  COMPARISON:  CT HEAD W/O CM dated 06/29/2012  FINDINGS: Mild diffuse cerebral atrophy. Low-attenuation changes in the deep white matter consistent with small vessel ischemia. No ventricular dilatation. No abnormal extra-axial fluid collections. Gray-white matter junctions are distinct. Basal cisterns are not effaced. No evidence of acute intracranial hemorrhage. No depressed skull fractures. Focal circumscribed lucency in the anterior left frontal region is nonspecific but appears stable since previous study. Mucosal thickening in the paranasal sinuses.  IMPRESSION: No acute intracranial abnormalities. Chronic atrophy and small vessel  ischemic changes.   Electronically Signed   By: Lucienne Capers M.D.   On: 04/18/2014 03:51     EKG Interpretation None      MDM  Patient was evaluated in the emergency department tonight for presumed fall.  The, CT scan of her head is normal.  She also received chest x-ray, lab work, EKG, to rule out cardiac or syncopal event, because the patient is suffering from dementia and cannot contribute to her overall history.  These were all within normal limits.  Patient was ambulated without difficulty and assistance of one Final diagnoses:  Fall at nursing home        Garald Balding, NP 04/18/14 [phone]

## 2014-04-18 NOTE — Discharge Instructions (Signed)
Fall Prevention and Home Safety Falls cause injuries and can affect all age groups. It is possible to prevent falls.  HOW TO PREVENT FALLS  Wear shoes with rubber soles that do not have an opening for your toes.  Keep the inside and outside of your house well lit.  Use night lights throughout your home.  Remove clutter from floors.  Clean up floor spills.  Remove throw rugs or fasten them to the floor with carpet tape.  Do not place electrical cords across pathways.  Put grab bars by your tub, shower, and toilet. Do not use towel bars as grab bars.  Put handrails on both sides of the stairway. Fix loose handrails.  Do not climb on stools or stepladders, if possible.  Do not wax your floors.  Repair uneven or unsafe sidewalks, walkways, or stairs.  Keep items you use a lot within reach.  Be aware of pets.  Keep emergency numbers next to the telephone.  Put smoke detectors in your home and near bedrooms. Ask your doctor what other things you can do to prevent falls. Document Released: 10/12/2009 Document Revised: 06/16/2012 Document Reviewed: 03/17/2012 [facility] Patient Information 2014 [place], [place]. Tonight.  Ms.He evaluation for presumed fall is normal She had a CT scan of her head and x-ray of her chest.  Lab work, and urine.  Labwork shows that her potassium was slightly low, at 3.1 mEq, she was supplemented with 40 mEq by mouth.  Please try to include green, leafy vegetables, and potassium rich foods in her diet.

## 2014-04-18 NOTE — ED Notes (Signed)
Patient's daughter concerned r/t patient possibly having PNA, states has been coughing for several days Will make MD aware

## 2014-04-19 NOTE — ED Provider Notes (Signed)
Shared service with midlevel provider. I have personally seen and examined the patient, providing direct face to face care, presenting with the chief complaint of possible fall. Physical exam findings include left sided hematoma. CT head is neg. Plan will be discharge. I have reviewed the nursing documentation on past medical history, family history, and social history.   Varney Biles, MD 04/19/14 [phone]

## 2014-05-05 ENCOUNTER — Other Ambulatory Visit: Payer: Self-pay | Admitting: *Deleted

## 2014-05-05 MED ORDER — RISPERIDONE 0.25 MG PO TABS: 0.2500 mg | ORAL_TABLET | Freq: Every day | ORAL | Status: DC

## 2014-05-05 NOTE — Telephone Encounter (Signed)
Refill appropriate and filled per protocol. 

## 2014-05-12 ENCOUNTER — Encounter: Payer: Self-pay | Admitting: Family Medicine

## 2014-05-12 ENCOUNTER — Ambulatory Visit (INDEPENDENT_AMBULATORY_CARE_PROVIDER_SITE_OTHER): Payer: Medicare Other | Admitting: Family Medicine

## 2014-05-12 VITALS — BP 130/72 | HR 80 | Temp 97.5°F | Resp 16 | Ht <= 58 in | Wt 132.0 lb

## 2014-05-12 DIAGNOSIS — R5381 Other malaise: Secondary | ICD-10-CM

## 2014-05-12 DIAGNOSIS — R5383 Other fatigue: Secondary | ICD-10-CM

## 2014-05-12 DIAGNOSIS — F0391 Unspecified dementia with behavioral disturbance: Secondary | ICD-10-CM

## 2014-05-12 DIAGNOSIS — F03918 Unspecified dementia, unspecified severity, with other behavioral disturbance: Secondary | ICD-10-CM

## 2014-05-12 LAB — COMPLETE METABOLIC PANEL WITH GFR
ALK PHOS: 76 U/L (ref 39–117)
ALT: 8 U/L (ref 0–35)
AST: 15 U/L (ref 0–37)
Albumin: 3.4 g/dL — ABNORMAL LOW (ref 3.5–5.2)
BILIRUBIN TOTAL: 0.6 mg/dL (ref 0.2–1.2)
BUN: 15 mg/dL (ref 6–23)
CO2: 26 mEq/L (ref 19–32)
CREATININE: 0.78 mg/dL (ref 0.50–1.10)
Calcium: 8.2 mg/dL — ABNORMAL LOW (ref 8.4–10.5)
Chloride: 107 mEq/L (ref 96–112)
GFR, EST NON AFRICAN AMERICAN: 65 mL/min
GFR, Est African American: 75 mL/min
GLUCOSE: 111 mg/dL — AB (ref 70–99)
Potassium: 3.6 mEq/L (ref 3.5–5.3)
Sodium: 142 mEq/L (ref 135–145)
Total Protein: 6.1 g/dL (ref 6.0–8.3)

## 2014-05-12 LAB — CBC WITH DIFFERENTIAL/PLATELET
BASOS ABS: 0 10*3/uL (ref 0.0–0.1)
Basophils Relative: 0 % (ref 0–1)
EOS ABS: 0 10*3/uL (ref 0.0–0.7)
Eosinophils Relative: 1 % (ref 0–5)
HCT: 32.9 % — ABNORMAL LOW (ref 36.0–46.0)
Hemoglobin: 11.4 g/dL — ABNORMAL LOW (ref 12.0–15.0)
Lymphocytes Relative: 33 % (ref 12–46)
Lymphs Abs: 1.6 10*3/uL (ref 0.7–4.0)
MCH: 30.2 pg (ref 26.0–34.0)
MCHC: 34.7 g/dL (ref 30.0–36.0)
MCV: 87.3 fL (ref 78.0–100.0)
MONO ABS: 0.3 10*3/uL (ref 0.1–1.0)
Monocytes Relative: 7 % (ref 3–12)
NEUTROS ABS: 2.9 10*3/uL (ref 1.7–7.7)
Neutrophils Relative %: 59 % (ref 43–77)
Platelets: 160 10*3/uL (ref 150–400)
RBC: 3.77 MIL/uL — ABNORMAL LOW (ref 3.87–5.11)
RDW: 14.5 % (ref 11.5–15.5)
WBC: 4.9 10*3/uL (ref 4.0–10.5)

## 2014-05-12 NOTE — Progress Notes (Signed)
Subjective:    Patient ID: Bethany Fox, female    DOB: 26-Sep-1917, 80 y.o.   MRN: [id number]  HPI Over the last 6 weeks, the patient has been sleeping more throughout the day. She takes frequent naps. She is easy to arouse. She is not combative. He is not delirious. She is not hallucinating more than her baseline. She is evidently confused. She often thinks she is outside of her nursing facilityand back in her hometown.  She denies any cough, fever, shortness of breath, chest pain, nausea, vomiting, diarrhea, or dysuria. She was recently seen in the emergency room after a fall, but CBC, CMP, and urinalysis were normal. There is no skin breakdown. She has no pressure sores. Her mucous membranes are moist. She is eating and drinking well. Family is concerned that her dementia is progressing. Past Medical History  Diagnosis Date  . Gastric ulcer   . Cancer     breast  . Depression   . Dementia   . Breast cancer   . H/O mastectomy     Left side    Current Outpatient Prescriptions on File Prior to Visit  Medication Sig Dispense Refill  . acetaminophen (TYLENOL) 500 MG tablet Take 500 mg by mouth every 6 (six) hours as needed for fever (or for pain).       Marland Kitchen docusate sodium (COLACE) 100 MG capsule Take 100 mg by mouth 2 (two) times daily.      . polyethylene glycol (MIRALAX / GLYCOLAX) packet Take 17 g by mouth daily.      . risperiDONE (RISPERDAL) 0.25 MG tablet Take 1 tablet (0.25 mg total) by mouth daily.  30 tablet  2  . venlafaxine XR (EFFEXOR-XR) 75 MG 24 hr capsule Take 75 mg by mouth daily with breakfast.       No current facility-administered medications on file prior to visit.   Allergies  Allergen Reactions  . Adhesive [Tape]   . Ciprofloxacin Rash   History   Social History  . Marital Status: Widowed    Spouse Name: N/A    Number of Children: N/A  . Years of Education: N/A   Occupational History  . Not on file.   Social History Main Topics  . Smoking status:  Never Smoker   . Smokeless tobacco: Not on file  . Alcohol Use: No  . Drug Use: No  . Sexual Activity: Not on file   Other Topics Concern  . Not on file   Social History Narrative  . No narrative on file      Review of Systems  All other systems reviewed and are negative.      Objective:   Physical Exam  Vitals reviewed. HENT:  Right Ear: External ear normal.  Left Ear: External ear normal.  Nose: Nose normal.  Mouth/Throat: Oropharynx is clear and moist. No oropharyngeal exudate.  Eyes: Conjunctivae and EOM are normal. Pupils are equal, round, and reactive to light. No scleral icterus.  Neck: Neck supple. No JVD present.  Cardiovascular: Normal rate, regular rhythm and normal heart sounds.   No murmur heard. Pulmonary/Chest: Breath sounds normal. No respiratory distress. She has no wheezes. She has no rales.  Abdominal: Soft. Bowel sounds are normal. She exhibits no distension. There is no tenderness. There is no rebound and no guarding.  Musculoskeletal: She exhibits no edema.  Lymphadenopathy:    She has no cervical adenopathy.  Neurological: She is alert. She has normal reflexes. She displays normal reflexes. No  cranial nerve deficit. She exhibits normal muscle tone. Coordination normal.  Psychiatric: She has a normal mood and affect. Her behavior is normal. Thought content normal. Her speech is delayed. Cognition and memory are impaired. She expresses inappropriate judgment. She exhibits abnormal recent memory and abnormal remote memory.          Assessment & Plan:  1. Dementia with behavioral disturbance - CBC with Differential - COMPLETE METABOLIC PANEL WITH GFR  2. Lethargy  I believe this is unfortunately the natural progression of her dementia. There are no signs of reversible cause such as a urinary tract infection, pneumonia. Check a CBC and CMP to rule out metabolic abnormalities that may also cause sleepiness. I do not believe the patient is  overmedicated. Therefore I reassured the patient and her family that this is the natural progression of her dementia. Present time we'll monitor her clinically for any worsening. I do not believe she would benefit from beginning Namenda or Aricept at this point due to the severity of her dementia. - CBC with Differential - COMPLETE METABOLIC PANEL WITH GFR

## 2014-06-23 ENCOUNTER — Encounter: Payer: Self-pay | Admitting: Family Medicine

## 2014-06-23 ENCOUNTER — Ambulatory Visit (INDEPENDENT_AMBULATORY_CARE_PROVIDER_SITE_OTHER): Payer: Medicare Other | Admitting: Family Medicine

## 2014-06-23 VITALS — BP 130/70 | HR 68 | Temp 98.7°F | Resp 16 | Ht <= 58 in | Wt 133.0 lb

## 2014-06-23 DIAGNOSIS — H6123 Impacted cerumen, bilateral: Secondary | ICD-10-CM

## 2014-06-23 DIAGNOSIS — W19XXXA Unspecified fall, initial encounter: Secondary | ICD-10-CM

## 2014-06-23 DIAGNOSIS — R404 Transient alteration of awareness: Secondary | ICD-10-CM

## 2014-06-23 DIAGNOSIS — H6120 Impacted cerumen, unspecified ear: Secondary | ICD-10-CM

## 2014-06-23 DIAGNOSIS — R410 Disorientation, unspecified: Secondary | ICD-10-CM

## 2014-06-23 NOTE — Progress Notes (Signed)
Subjective:    Patient ID: Bethany Fox, female    DOB: [birth year], 80 y.o.   MRN: [id number]  HPI Patient is a 80 year old white female with a history of dementia and delirium. She is currently on risperdal for delirium from her dementia.  Recently she has been more agitated and combative. On Monday she was agitated and will try and get up from the bed she fell. Apparently she struck the right side of her face. She has slight bruising underneath her right eye. She also has a yellowish purple bruise on her right cheek in front of her right ear. She did not lose consciousness. She does not complaining headache. Pupils are equal round reactive to light. Her extraocular movement and other cranial nerve testing normal. The remainder of her neurologic exam is normal. Today she is at her baseline mental status which is pleasantly confused. She denies any pain in her shoulders elbows hips knees or lower back. There is no apparent traumatic injury from the fall outside of the bruising. She does have slight swelling in both ankles and in her left wrist just been gradually worsening over the last month. There is no pain. This tends to be slight pitting edema. Her weight is up 1 pound since her last office visit on May 12. She denies any chest pain or shortness of breath.  Her daughter does report decreased hearing. She is concerned that the patient may have cerumen impactions as she's had similar symptoms in the past. Past Medical History  Diagnosis Date  . Gastric ulcer   . Cancer     breast  . Depression   . Dementia   . Breast cancer   . H/O mastectomy     Left side    Past Surgical History  Procedure Laterality Date  . Cholecystectomy    . Mastectomy     Current Outpatient Prescriptions on File Prior to Visit  Medication Sig Dispense Refill  . acetaminophen (TYLENOL) 500 MG tablet Take 500 mg by mouth every 6 (six) hours as needed for fever (or for pain).       Marland Kitchen docusate sodium (COLACE) 100  MG capsule Take 100 mg by mouth 2 (two) times daily.      . polyethylene glycol (MIRALAX / GLYCOLAX) packet Take 17 g by mouth daily.      . risperiDONE (RISPERDAL) 0.25 MG tablet Take 1 tablet (0.25 mg total) by mouth daily.  30 tablet  2  . venlafaxine XR (EFFEXOR-XR) 75 MG 24 hr capsule Take 75 mg by mouth daily with breakfast.       No current facility-administered medications on file prior to visit.   Allergies  Allergen Reactions  . Adhesive [Tape]   . Ciprofloxacin Rash   History   Social History  . Marital Status: Widowed    Spouse Name: N/A    Number of Children: N/A  . Years of Education: N/A   Occupational History  . Not on file.   Social History Main Topics  . Smoking status: Never Smoker   . Smokeless tobacco: Not on file  . Alcohol Use: No  . Drug Use: No  . Sexual Activity: Not on file   Other Topics Concern  . Not on file   Social History Narrative  . No narrative on file      Review of Systems  All other systems reviewed and are negative.      Objective:   Physical Exam  Constitutional: She appears  well-developed and well-nourished.  Cardiovascular: Normal rate, regular rhythm and normal heart sounds.   No murmur heard. Pulmonary/Chest: Breath sounds normal. No respiratory distress. She has no wheezes. She has no rales.  Abdominal: Soft. Bowel sounds are normal.  Musculoskeletal: Normal range of motion. She exhibits no tenderness.       Right shoulder: She exhibits normal range of motion, no tenderness and no bony tenderness.       Left shoulder: She exhibits normal range of motion, no tenderness and no bony tenderness.       Right hip: She exhibits normal range of motion and no tenderness.       Left hip: She exhibits normal range of motion and no tenderness.       Right knee: She exhibits normal range of motion, no swelling, no effusion and no ecchymosis.       Left knee: She exhibits normal range of motion, no swelling, no effusion and no  ecchymosis.       Cervical back: She exhibits normal range of motion, no tenderness and no bony tenderness.  Neurological: She is alert. She has normal reflexes. She displays normal reflexes. No cranial nerve deficit. She exhibits normal muscle tone.   Patient does have a yellow bruise on her right cheek in front of her right heel. She also has some erythematous swelling underneath her right both of which appear to be contusions      Assessment & Plan:  Fall, initial encounter  Delirium  Cerumen impaction, bilateral   Patient did suffer mild contusions on the right side of her face from her fall. However her exam is relatively normal today. I am not concerned about an intracranial hemorrhage as the fall this happened on Monday and is now Thursday. The patient is at her baseline mental status. There appears to be no evidence of any kind of fractures. Therefore, I feel no further workup is needed for the fall. Patient has a baseline delirium due to her dementia however I will check a urine to rule out an underlying urinary tract infection for increasing delirium recently. Patient does have bilateral cerumen impactions on examination. The cerumen impaction was able to be completely removed from the left external auditory canal using irrigation/lavage, but the patient did suffer a slight abrasion on the inferior portion of the canal.  There is trace amount of bright red blood on the floor of the canal but no trauma was seen on the tympanic membrane. A large percentage of the cerumen impaction was removed from the right external auditory canal but we are unable to completely remove cerumen impaction. Patient was offered an ENT consultation however they declined at the present time.  Patient does have trace edema in both ankles as well as in her left wrist. However there is no evidence on her exam for congestive heart failure volume overload. Her weight is relatively stable. I explained to the family and  concerned patient on a fluid pill would likely cause more complications such as falls, dehydration, low BP, electrolyte disturbances. For this amount of swelling I think no treatment is warranted at this time.

## 2014-06-30 ENCOUNTER — Emergency Department (HOSPITAL_COMMUNITY): Payer: Medicare Other

## 2014-06-30 ENCOUNTER — Inpatient Hospital Stay (HOSPITAL_COMMUNITY)
Admission: EM | Admit: 2014-06-30 | Discharge: 2014-07-04 | DRG: 481 | Disposition: A | Discharge status: Skilled Nursing Facility | Payer: Medicare Other | Attending: Internal Medicine | Admitting: Internal Medicine

## 2014-06-30 ENCOUNTER — Encounter (HOSPITAL_COMMUNITY): Payer: Self-pay | Admitting: Emergency Medicine

## 2014-06-30 DIAGNOSIS — Y9210 Unspecified residential institution as the place of occurrence of the external cause: Secondary | ICD-10-CM | POA: Diagnosis present

## 2014-06-30 DIAGNOSIS — N289 Disorder of kidney and ureter, unspecified: Secondary | ICD-10-CM | POA: Diagnosis not present

## 2014-06-30 DIAGNOSIS — Z9109 Other allergy status, other than to drugs and biological substances: Secondary | ICD-10-CM

## 2014-06-30 DIAGNOSIS — M79609 Pain in unspecified limb: Secondary | ICD-10-CM | POA: Diagnosis not present

## 2014-06-30 DIAGNOSIS — D72829 Elevated white blood cell count, unspecified: Secondary | ICD-10-CM | POA: Diagnosis not present

## 2014-06-30 DIAGNOSIS — S72001A Fracture of unspecified part of neck of right femur, initial encounter for closed fracture: Secondary | ICD-10-CM

## 2014-06-30 DIAGNOSIS — J449 Chronic obstructive pulmonary disease, unspecified: Secondary | ICD-10-CM | POA: Diagnosis not present

## 2014-06-30 DIAGNOSIS — S52121A Displaced fracture of head of right radius, initial encounter for closed fracture: Secondary | ICD-10-CM

## 2014-06-30 DIAGNOSIS — I10 Essential (primary) hypertension: Secondary | ICD-10-CM | POA: Diagnosis not present

## 2014-06-30 DIAGNOSIS — W19XXXA Unspecified fall, initial encounter: Secondary | ICD-10-CM

## 2014-06-30 DIAGNOSIS — F329 Major depressive disorder, single episode, unspecified: Secondary | ICD-10-CM | POA: Diagnosis present

## 2014-06-30 DIAGNOSIS — F3289 Other specified depressive episodes: Secondary | ICD-10-CM | POA: Diagnosis present

## 2014-06-30 DIAGNOSIS — N179 Acute kidney failure, unspecified: Secondary | ICD-10-CM | POA: Diagnosis not present

## 2014-06-30 DIAGNOSIS — Z79899 Other long term (current) drug therapy: Secondary | ICD-10-CM | POA: Diagnosis not present

## 2014-06-30 DIAGNOSIS — S52123A Displaced fracture of head of unspecified radius, initial encounter for closed fracture: Secondary | ICD-10-CM | POA: Diagnosis present

## 2014-06-30 DIAGNOSIS — F0390 Unspecified dementia without behavioral disturbance: Secondary | ICD-10-CM | POA: Diagnosis present

## 2014-06-30 DIAGNOSIS — S72009S Fracture of unspecified part of neck of unspecified femur, sequela: Secondary | ICD-10-CM

## 2014-06-30 DIAGNOSIS — D62 Acute posthemorrhagic anemia: Secondary | ICD-10-CM | POA: Diagnosis not present

## 2014-06-30 DIAGNOSIS — R6889 Other general symptoms and signs: Secondary | ICD-10-CM | POA: Diagnosis not present

## 2014-06-30 DIAGNOSIS — S72009A Fracture of unspecified part of neck of unspecified femur, initial encounter for closed fracture: Secondary | ICD-10-CM | POA: Diagnosis not present

## 2014-06-30 DIAGNOSIS — E8770 Fluid overload, unspecified: Secondary | ICD-10-CM | POA: Clinically undetermined

## 2014-06-30 DIAGNOSIS — S0990XA Unspecified injury of head, initial encounter: Secondary | ICD-10-CM | POA: Diagnosis not present

## 2014-06-30 DIAGNOSIS — F03918 Unspecified dementia, unspecified severity, with other behavioral disturbance: Secondary | ICD-10-CM | POA: Diagnosis not present

## 2014-06-30 DIAGNOSIS — S79919A Unspecified injury of unspecified hip, initial encounter: Secondary | ICD-10-CM | POA: Diagnosis not present

## 2014-06-30 DIAGNOSIS — S72109A Unspecified trochanteric fracture of unspecified femur, initial encounter for closed fracture: Secondary | ICD-10-CM | POA: Diagnosis not present

## 2014-06-30 DIAGNOSIS — M6281 Muscle weakness (generalized): Secondary | ICD-10-CM | POA: Diagnosis not present

## 2014-06-30 DIAGNOSIS — S72143A Displaced intertrochanteric fracture of unspecified femur, initial encounter for closed fracture: Principal | ICD-10-CM | POA: Diagnosis present

## 2014-06-30 DIAGNOSIS — Z9181 History of falling: Secondary | ICD-10-CM | POA: Diagnosis not present

## 2014-06-30 DIAGNOSIS — R3989 Other symptoms and signs involving the genitourinary system: Secondary | ICD-10-CM | POA: Diagnosis present

## 2014-06-30 DIAGNOSIS — Z4789 Encounter for other orthopedic aftercare: Secondary | ICD-10-CM | POA: Diagnosis not present

## 2014-06-30 DIAGNOSIS — M25559 Pain in unspecified hip: Secondary | ICD-10-CM | POA: Diagnosis not present

## 2014-06-30 DIAGNOSIS — F0391 Unspecified dementia with behavioral disturbance: Secondary | ICD-10-CM | POA: Diagnosis not present

## 2014-06-30 DIAGNOSIS — R32 Unspecified urinary incontinence: Secondary | ICD-10-CM | POA: Diagnosis present

## 2014-06-30 DIAGNOSIS — S298XXA Other specified injuries of thorax, initial encounter: Secondary | ICD-10-CM | POA: Diagnosis not present

## 2014-06-30 DIAGNOSIS — Z853 Personal history of malignant neoplasm of breast: Secondary | ICD-10-CM

## 2014-06-30 DIAGNOSIS — E8779 Other fluid overload: Secondary | ICD-10-CM | POA: Diagnosis not present

## 2014-06-30 DIAGNOSIS — S72001S Fracture of unspecified part of neck of right femur, sequela: Secondary | ICD-10-CM

## 2014-06-30 DIAGNOSIS — M259 Joint disorder, unspecified: Secondary | ICD-10-CM | POA: Diagnosis not present

## 2014-06-30 DIAGNOSIS — Z66 Do not resuscitate: Secondary | ICD-10-CM | POA: Diagnosis present

## 2014-06-30 DIAGNOSIS — S72141A Displaced intertrochanteric fracture of right femur, initial encounter for closed fracture: Secondary | ICD-10-CM

## 2014-06-30 DIAGNOSIS — S0993XA Unspecified injury of face, initial encounter: Secondary | ICD-10-CM | POA: Diagnosis not present

## 2014-06-30 DIAGNOSIS — S79929A Unspecified injury of unspecified thigh, initial encounter: Secondary | ICD-10-CM | POA: Diagnosis not present

## 2014-06-30 DIAGNOSIS — R269 Unspecified abnormalities of gait and mobility: Secondary | ICD-10-CM | POA: Diagnosis not present

## 2014-06-30 DIAGNOSIS — S72009D Fracture of unspecified part of neck of unspecified femur, subsequent encounter for closed fracture with routine healing: Secondary | ICD-10-CM | POA: Diagnosis not present

## 2014-06-30 DIAGNOSIS — Z881 Allergy status to other antibiotic agents status: Secondary | ICD-10-CM

## 2014-06-30 DIAGNOSIS — S52109A Unspecified fracture of upper end of unspecified radius, initial encounter for closed fracture: Secondary | ICD-10-CM | POA: Diagnosis not present

## 2014-06-30 LAB — CBC WITH DIFFERENTIAL/PLATELET
BASOS ABS: 0 10*3/uL (ref 0.0–0.1)
Basophils Relative: 0 % (ref 0–1)
EOS PCT: 0 % (ref 0–5)
Eosinophils Absolute: 0 10*3/uL (ref 0.0–0.7)
HEMATOCRIT: 37.1 % (ref 36.0–46.0)
Hemoglobin: 12.5 g/dL (ref 12.0–15.0)
LYMPHS ABS: 1.2 10*3/uL (ref 0.7–4.0)
Lymphocytes Relative: 10 % — ABNORMAL LOW (ref 12–46)
MCH: 30.5 pg (ref 26.0–34.0)
MCHC: 33.7 g/dL (ref 30.0–36.0)
MCV: 90.5 fL (ref 78.0–100.0)
MONO ABS: 0.5 10*3/uL (ref 0.1–1.0)
Monocytes Relative: 4 % (ref 3–12)
Neutro Abs: 10.4 10*3/uL — ABNORMAL HIGH (ref 1.7–7.7)
Neutrophils Relative %: 86 % — ABNORMAL HIGH (ref 43–77)
Platelets: 162 10*3/uL (ref 150–400)
RBC: 4.1 MIL/uL (ref 3.87–5.11)
RDW: 13.8 % (ref 11.5–15.5)
WBC: 12.2 10*3/uL — ABNORMAL HIGH (ref 4.0–10.5)

## 2014-06-30 LAB — COMPREHENSIVE METABOLIC PANEL
ALT: 11 U/L (ref 0–35)
AST: 19 U/L (ref 0–37)
Albumin: 3.7 g/dL (ref 3.5–5.2)
Alkaline Phosphatase: 124 U/L — ABNORMAL HIGH (ref 39–117)
Anion gap: 13 (ref 5–15)
BILIRUBIN TOTAL: 0.5 mg/dL (ref 0.3–1.2)
BUN: 16 mg/dL (ref 6–23)
CALCIUM: 9 mg/dL (ref 8.4–10.5)
CHLORIDE: 99 meq/L (ref 96–112)
CO2: 26 mEq/L (ref 19–32)
CREATININE: 0.65 mg/dL (ref 0.50–1.10)
GFR calc non Af Amer: 73 mL/min — ABNORMAL LOW (ref >90)
GFR, EST AFRICAN AMERICAN: 85 mL/min — AB (ref >90)
Glucose, Bld: 127 mg/dL — ABNORMAL HIGH (ref 70–99)
Potassium: 3.6 mEq/L — ABNORMAL LOW (ref 3.7–5.3)
SODIUM: 138 meq/L (ref 137–147)
Total Protein: 7.3 g/dL (ref 6.0–8.3)

## 2014-06-30 LAB — PROTIME-INR
INR: 1.06 (ref 0.00–1.49)
Prothrombin Time: 13.8 seconds (ref 11.6–15.2)

## 2014-06-30 MED ORDER — HYDRALAZINE HCL 20 MG/ML IJ SOLN
10.0000 mg | Freq: Once | INTRAMUSCULAR | Status: AC
  Administered 2014-06-30: 10 mg via INTRAVENOUS
  Filled 2014-06-30: qty 0.5

## 2014-06-30 MED ORDER — TRAMADOL HCL 50 MG PO TABS
50.0000 mg | ORAL_TABLET | Freq: Once | ORAL | Status: AC
  Administered 2014-06-30: 50 mg via ORAL
  Filled 2014-06-30: qty 1

## 2014-06-30 MED ORDER — RISPERIDONE 0.25 MG PO TABS
0.2500 mg | ORAL_TABLET | Freq: Every day | ORAL | Status: DC
  Administered 2014-06-30 – 2014-07-03 (×3): 0.25 mg via ORAL
  Filled 2014-06-30 (×5): qty 1

## 2014-06-30 NOTE — H&P (Signed)
PCP:   Odette Fraction, MD   Chief Complaint:  fell  HPI: 80 yo female demented fell today at her snf.  dtr with pt now.  Pt is normally ambulatory.  Moderate to advanced dementia.  No recent illnesses.  She broke her rt hip.  Her pain is well controlled at this time.  Pt is DNR.  Review of Systems:  Positive and negative as per HPI otherwise all other systems are negative per dtr  Past Medical History: Past Medical History  Diagnosis Date  . Gastric ulcer   . Cancer     breast  . Depression   . Dementia   . Breast cancer   . H/O mastectomy     Left side    Past Surgical History  Procedure Laterality Date  . Cholecystectomy    . Mastectomy      Medications: Prior to Admission medications   Medication Sig Start Date End Date Taking? Authorizing Provider  acetaminophen (TYLENOL) 500 MG tablet Take 500 mg by mouth every 6 (six) hours as needed for fever (or for pain).    Yes Historical Provider, MD  chlorhexidine (PERIDEX) 0.12 % solution Use as directed 15 mLs in the mouth or throat 2 (two) times daily.   Yes Historical Provider, MD  docusate sodium (COLACE) 100 MG capsule Take 100 mg by mouth 2 (two) times daily.   Yes Historical Provider, MD  polyethylene glycol (MIRALAX / GLYCOLAX) packet Take 17 g by mouth daily.   Yes Historical Provider, MD  risperiDONE (RISPERDAL) 0.25 MG tablet Take 1 tablet (0.25 mg total) by mouth daily. 05/05/14  Yes Susy Frizzle, MD  venlafaxine XR (EFFEXOR-XR) 75 MG 24 hr capsule Take 75 mg by mouth daily with breakfast.   Yes Historical Provider, MD    Allergies:   Allergies  Allergen Reactions  . Adhesive [Tape]     Unknown   . Ciprofloxacin Rash    Social History:  reports that she has never smoked. She does not have any smokeless tobacco history on file. She reports that she does not drink alcohol or use illicit drugs.  Family History: none  Physical Exam: Filed Vitals:   06/30/14 1953 06/30/14 2005 06/30/14 2230  BP:  195/79 213/79 193/78  Pulse: 86 88 98  Temp: 97.7 F (36.5 C)  97.8 F (36.6 C)  TempSrc: Oral    Resp: 18  19  SpO2: 97% 99% 95%   General appearance: alert, cooperative and no distress Head: Normocephalic, without obvious abnormality, atraumatic Eyes: negative Nose: Nares normal. Septum midline. Mucosa normal. No drainage or sinus tenderness. Neck: no JVD and supple, symmetrical, trachea midline Lungs: clear to auscultation bilaterally Heart: regular rate and rhythm, S1, S2 normal, no murmur, click, rub or gallop Abdomen: soft, non-tender; bowel sounds normal; no masses,  no organomegaly Extremities: extremities normal, atraumatic, no cyanosis or edema rt arm in sling, rt leg in traction Pulses: 2+ and symmetric Skin: Skin color, texture, turgor normal. No rashes or lesions Neurologic: Grossly normal pleasantly demented    Labs on Admission:   Recent Labs  06/30/14 2149  NA 138  K 3.6*  CL 99  CO2 26  GLUCOSE 127*  BUN 16  CREATININE 0.65  CALCIUM 9.0    Recent Labs  06/30/14 2149  AST 19  ALT 11  ALKPHOS 124*  BILITOT 0.5  PROT 7.3  ALBUMIN 3.7   Radiological Exams on Admission: Dg Chest 2 View  06/30/2014   CLINICAL DATA:  Fall, right forearm pain.  EXAM: CHEST  2 VIEW  COMPARISON:  Chest radiograph April 18, 2014  FINDINGS: Cardiac silhouette is upper limits of normal in size, unchanged. Mediastinal silhouette is nonsuspicious, mildly calcified aortic knob. Similar chronic interstitial changes without pleural effusions or focal consolidations. No pneumothorax, biapical pleural thickening.  Osteopenia.  Surgical clips project in left axilla.  IMPRESSION: Borderline cardiomegaly and chronic interstitial changes.   Electronically Signed   By: Elon Alas   On: 06/30/2014 21:01   Dg Pelvis 1-2 Views  06/30/2014   CLINICAL DATA:  Fall  EXAM: PELVIS - 1-2 VIEW  COMPARISON:  None.  FINDINGS: Nondisplaced right intertrochanteric fracture. No dislocation.  Moderate osteoarthritis of the right hip.  No left hip fracture. Generalized osteopenia. Mild degenerative changes of bilateral SI joints and lower lumbar spine.  IMPRESSION: Nondisplaced right intertrochanteric fracture without dislocation.   Electronically Signed   By: Kathreen Devoid   On: 06/30/2014 21:00   Dg Forearm Right  06/30/2014   CLINICAL DATA:  Fall  EXAM: RIGHT FOREARM - 2 VIEW  COMPARISON:  None.  FINDINGS: Displaced right radial head fracture with the volar fragment rotated and displaced anteriorly.  There is no other fracture or dislocation.  There is generalized osteopenia. There are degenerative changes of the triscaphe joint.  IMPRESSION: Displaced right radial head fracture.   Electronically Signed   By: Kathreen Devoid   On: 06/30/2014 21:02   Dg Femur Right  06/30/2014   CLINICAL DATA:  Fall.  Right femur pain.  EXAM: RIGHT FEMUR - 2 VIEW  COMPARISON:  None.  FINDINGS: A mildly displaced intertrochanteric fracture is present. Chronic degenerative changes are noted the hip with significant loss of joint space. Mild degenerative changes are noted at the knee. No additional fractures are evident.  IMPRESSION: 1. Intertrochanteric fracture of the right femur. 2. Chronic degenerative changes of the hip and knee.   Electronically Signed   By: Lawrence Santiago M.D.   On: 06/30/2014 21:04   Ct Head Wo Contrast  06/30/2014   CLINICAL DATA:  Status post fall  EXAM: CT HEAD WITHOUT CONTRAST  CT CERVICAL SPINE WITHOUT CONTRAST  TECHNIQUE: Multidetector CT imaging of the head and cervical spine was performed following the standard protocol without intravenous contrast. Multiplanar CT image reconstructions of the cervical spine were also generated.  COMPARISON:  04/18/2014 head CT  FINDINGS: CT HEAD FINDINGS  Skull and Sinuses:Negative for fracture or destructive process. The mastoids, middle ears, and imaged paranasal sinuses are clear.  Orbits: Bilateral cataract resection  Brain: No evidence of acute  abnormality, such as acute infarction, hemorrhage, hydrocephalus, or mass lesion/mass effect. There is generalized cerebral volume loss which is age commensurate. Mild chronic small vessel disease, best seen around the lateral ventricles, also expected for age.  CT CERVICAL SPINE FINDINGS  There is no evidence of acute fracture or traumatic subluxation. Diffuse degenerative disc narrowing and facet osteoarthritis. There is no notable osseous canal or foraminal stenosis. C7-T1 posterior element ankylosis is noted. No prevertebral swelling or gross cervical canal hematoma. There is reticular scarring in the right more than left apices, with bronchiectasis noted on the right.  IMPRESSION: 1. No evidence of acute intracranial or cervical spine injury. 2. Intracranial and cervical spine senescent changes are noted above. 3. Postinfectious biapical lung scarring with bronchiectasis.   Electronically Signed   By: Jorje Guild M.D.   On: 06/30/2014 21:48   Ct Cervical Spine Wo Contrast  06/30/2014  CLINICAL DATA:  Status post fall  EXAM: CT HEAD WITHOUT CONTRAST  CT CERVICAL SPINE WITHOUT CONTRAST  TECHNIQUE: Multidetector CT imaging of the head and cervical spine was performed following the standard protocol without intravenous contrast. Multiplanar CT image reconstructions of the cervical spine were also generated.  COMPARISON:  04/18/2014 head CT  FINDINGS: CT HEAD FINDINGS  Skull and Sinuses:Negative for fracture or destructive process. The mastoids, middle ears, and imaged paranasal sinuses are clear.  Orbits: Bilateral cataract resection  Brain: No evidence of acute abnormality, such as acute infarction, hemorrhage, hydrocephalus, or mass lesion/mass effect. There is generalized cerebral volume loss which is age commensurate. Mild chronic small vessel disease, best seen around the lateral ventricles, also expected for age.  CT CERVICAL SPINE FINDINGS  There is no evidence of acute fracture or traumatic  subluxation. Diffuse degenerative disc narrowing and facet osteoarthritis. There is no notable osseous canal or foraminal stenosis. C7-T1 posterior element ankylosis is noted. No prevertebral swelling or gross cervical canal hematoma. There is reticular scarring in the right more than left apices, with bronchiectasis noted on the right.  IMPRESSION: 1. No evidence of acute intracranial or cervical spine injury. 2. Intracranial and cervical spine senescent changes are noted above. 3. Postinfectious biapical lung scarring with bronchiectasis.   Electronically Signed   By: Jorje Guild M.D.   On: 06/30/2014 21:48    Assessment/Plan  80 yo female with fall with right radial and femur fractures  Principal Problem:   Hip fracture, right-  Ortho to see in am to go over options with daughter.  Hip fracture pathway.  DNR.  Active Problems:  Stable unless o/w noted   Dementia   Fall   Right radial head fracture   HTN (hypertension)-  Uncontrolled, pt does not appear to be in significant pain, will give hydralazine prn.    Jariah Tarkowski A 06/30/2014, 10:44 PM

## 2014-06-30 NOTE — ED Provider Notes (Signed)
CSN: [id number]     Arrival date & time 06/30/14  1943 History   First MD Initiated Contact with Patient 06/30/14 1947     Chief Complaint  Patient presents with  . Fall  . Wrist Pain  . Knee Pain     (Consider location/radiation/quality/duration/timing/severity/associated sxs/prior Treatment) The history is provided by the patient.  Bethany Fox is a 80 y.o. female hx of depression, dementia here with fall. She resides in a memory care unit. She states that she was at the dining room and fell. It was unwitnessed and she doesn't remember how she fell. She was noted to have pain on R leg and knee and R wrist. She was able to ambulate after the incident.   Level V caveat- dementia    Past Medical History  Diagnosis Date  . Gastric ulcer   . Cancer     breast  . Depression   . Dementia   . Breast cancer   . H/O mastectomy     Left side    Past Surgical History  Procedure Laterality Date  . Cholecystectomy    . Mastectomy     No family history on file. History  Substance Use Topics  . Smoking status: Never Smoker   . Smokeless tobacco: Not on file  . Alcohol Use: No   OB History   Grav Para Term Preterm Abortions TAB SAB Ect Mult Living                 Review of Systems  Unable to perform ROS: Dementia      Allergies  Adhesive and Ciprofloxacin  Home Medications   Prior to Admission medications   Medication Sig Start Date End Date Taking? Authorizing Provider  acetaminophen (TYLENOL) 500 MG tablet Take 500 mg by mouth every 6 (six) hours as needed for fever (or for pain).    Yes Historical Provider, MD  chlorhexidine (PERIDEX) 0.12 % solution Use as directed 15 mLs in the mouth or throat 2 (two) times daily.   Yes Historical Provider, MD  docusate sodium (COLACE) 100 MG capsule Take 100 mg by mouth 2 (two) times daily.   Yes Historical Provider, MD  polyethylene glycol (MIRALAX / GLYCOLAX) packet Take 17 g by mouth daily.   Yes Historical Provider, MD   risperiDONE (RISPERDAL) 0.25 MG tablet Take 1 tablet (0.25 mg total) by mouth daily. 05/05/14  Yes Susy Frizzle, MD  venlafaxine XR (EFFEXOR-XR) 75 MG 24 hr capsule Take 75 mg by mouth daily with breakfast.   Yes Historical Provider, MD   BP 193/78  Pulse 98  Temp(Src) 97.8 F (36.6 C) (Oral)  Resp 19  SpO2 95% Physical Exam  Nursing note and vitals reviewed. Constitutional:  Demented,   HENT:  Head: Normocephalic and atraumatic.  Mouth/Throat: Oropharynx is clear and moist.  Eyes: Conjunctivae and EOM are normal. Pupils are equal, round, and reactive to light.  Neck: Normal range of motion. Neck supple.  Cardiovascular: Normal rate, regular rhythm and normal heart sounds.   Pulmonary/Chest: Effort normal and breath sounds normal. No respiratory distress. She has no wheezes. She has no rales.  Abdominal: Soft. Bowel sounds are normal. She exhibits no distension. There is no tenderness. There is no rebound and no guarding.  Musculoskeletal:  Dec ROM R hip. Mild tenderness R knee. R wrist minimally tender but no obvious deformity. Pelvis stable. No spinal tenderness   Neurological: She is alert.  Demented, moving all extremities   Skin:  Skin is warm and dry.  Psychiatric:  Unable     ED Course  Procedures (including critical care time) Labs Review Labs Reviewed  CBC WITH DIFFERENTIAL - Abnormal; Notable for the following:    WBC 12.2 (*)    Neutrophils Relative % 86 (*)    Neutro Abs 10.4 (*)    Lymphocytes Relative 10 (*)    All other components within normal limits  COMPREHENSIVE METABOLIC PANEL - Abnormal; Notable for the following:    Potassium 3.6 (*)    Glucose, Bld 127 (*)    Alkaline Phosphatase 124 (*)    GFR calc non Af Amer 73 (*)    GFR calc Af Amer 85 (*)    All other components within normal limits  PROTIME-INR    Imaging Review Dg Chest 2 View  06/30/2014   CLINICAL DATA:  Fall, right forearm pain.  EXAM: CHEST  2 VIEW  COMPARISON:  Chest radiograph  April 18, 2014  FINDINGS: Cardiac silhouette is upper limits of normal in size, unchanged. Mediastinal silhouette is nonsuspicious, mildly calcified aortic knob. Similar chronic interstitial changes without pleural effusions or focal consolidations. No pneumothorax, biapical pleural thickening.  Osteopenia.  Surgical clips project in left axilla.  IMPRESSION: Borderline cardiomegaly and chronic interstitial changes.   Electronically Signed   By: Elon Alas   On: 06/30/2014 21:01   Dg Pelvis 1-2 Views  06/30/2014   CLINICAL DATA:  Fall  EXAM: PELVIS - 1-2 VIEW  COMPARISON:  None.  FINDINGS: Nondisplaced right intertrochanteric fracture. No dislocation. Moderate osteoarthritis of the right hip.  No left hip fracture. Generalized osteopenia. Mild degenerative changes of bilateral SI joints and lower lumbar spine.  IMPRESSION: Nondisplaced right intertrochanteric fracture without dislocation.   Electronically Signed   By: Kathreen Devoid   On: 06/30/2014 21:00   Dg Forearm Right  06/30/2014   CLINICAL DATA:  Fall  EXAM: RIGHT FOREARM - 2 VIEW  COMPARISON:  None.  FINDINGS: Displaced right radial head fracture with the volar fragment rotated and displaced anteriorly.  There is no other fracture or dislocation.  There is generalized osteopenia. There are degenerative changes of the triscaphe joint.  IMPRESSION: Displaced right radial head fracture.   Electronically Signed   By: Kathreen Devoid   On: 06/30/2014 21:02   Dg Femur Right  06/30/2014   CLINICAL DATA:  Fall.  Right femur pain.  EXAM: RIGHT FEMUR - 2 VIEW  COMPARISON:  None.  FINDINGS: A mildly displaced intertrochanteric fracture is present. Chronic degenerative changes are noted the hip with significant loss of joint space. Mild degenerative changes are noted at the knee. No additional fractures are evident.  IMPRESSION: 1. Intertrochanteric fracture of the right femur. 2. Chronic degenerative changes of the hip and knee.   Electronically Signed   By:  Lawrence Santiago M.D.   On: 06/30/2014 21:04   Ct Head Wo Contrast  06/30/2014   CLINICAL DATA:  Status post fall  EXAM: CT HEAD WITHOUT CONTRAST  CT CERVICAL SPINE WITHOUT CONTRAST  TECHNIQUE: Multidetector CT imaging of the head and cervical spine was performed following the standard protocol without intravenous contrast. Multiplanar CT image reconstructions of the cervical spine were also generated.  COMPARISON:  04/18/2014 head CT  FINDINGS: CT HEAD FINDINGS  Skull and Sinuses:Negative for fracture or destructive process. The mastoids, middle ears, and imaged paranasal sinuses are clear.  Orbits: Bilateral cataract resection  Brain: No evidence of acute abnormality, such as acute infarction, hemorrhage, hydrocephalus,  or mass lesion/mass effect. There is generalized cerebral volume loss which is age commensurate. Mild chronic small vessel disease, best seen around the lateral ventricles, also expected for age.  CT CERVICAL SPINE FINDINGS  There is no evidence of acute fracture or traumatic subluxation. Diffuse degenerative disc narrowing and facet osteoarthritis. There is no notable osseous canal or foraminal stenosis. C7-T1 posterior element ankylosis is noted. No prevertebral swelling or gross cervical canal hematoma. There is reticular scarring in the right more than left apices, with bronchiectasis noted on the right.  IMPRESSION: 1. No evidence of acute intracranial or cervical spine injury. 2. Intracranial and cervical spine senescent changes are noted above. 3. Postinfectious biapical lung scarring with bronchiectasis.   Electronically Signed   By: Jorje Guild M.D.   On: 06/30/2014 21:48   Ct Cervical Spine Wo Contrast  06/30/2014   CLINICAL DATA:  Status post fall  EXAM: CT HEAD WITHOUT CONTRAST  CT CERVICAL SPINE WITHOUT CONTRAST  TECHNIQUE: Multidetector CT imaging of the head and cervical spine was performed following the standard protocol without intravenous contrast. Multiplanar CT image  reconstructions of the cervical spine were also generated.  COMPARISON:  04/18/2014 head CT  FINDINGS: CT HEAD FINDINGS  Skull and Sinuses:Negative for fracture or destructive process. The mastoids, middle ears, and imaged paranasal sinuses are clear.  Orbits: Bilateral cataract resection  Brain: No evidence of acute abnormality, such as acute infarction, hemorrhage, hydrocephalus, or mass lesion/mass effect. There is generalized cerebral volume loss which is age commensurate. Mild chronic small vessel disease, best seen around the lateral ventricles, also expected for age.  CT CERVICAL SPINE FINDINGS  There is no evidence of acute fracture or traumatic subluxation. Diffuse degenerative disc narrowing and facet osteoarthritis. There is no notable osseous canal or foraminal stenosis. C7-T1 posterior element ankylosis is noted. No prevertebral swelling or gross cervical canal hematoma. There is reticular scarring in the right more than left apices, with bronchiectasis noted on the right.  IMPRESSION: 1. No evidence of acute intracranial or cervical spine injury. 2. Intracranial and cervical spine senescent changes are noted above. 3. Postinfectious biapical lung scarring with bronchiectasis.   Electronically Signed   By: Jorje Guild M.D.   On: 06/30/2014 21:48     EKG Interpretation   Date/Time:  Thursday June 30 2014 21:34:51 EDT Ventricular Rate:  95 PR Interval:  201 QRS Duration: 106 QT Interval:  363 QTC Calculation: 456 R Axis:   -48 Text Interpretation:  Sinus rhythm LAD, consider left anterior fascicular  block LVH with secondary repolarization abnormality Anterior Q waves,  possibly due to LVH No significant change since last tracing Confirmed by  Darlisha Kelm  MD, Shamika Pedregon ([id number]) on 06/30/2014 9:42:27 PM      MDM   Final diagnoses:  None    Bethany Fox is a 80 y.o. female here with fall. Unknown head injury. Will do CT head/neck. Will get xrays.   10:33 PM xrays showed R inter  trochanteric fracture and R radial head fracture. Dr. Rolena Infante consulted and will see patient in AM. Recommend bucks traction and R arm splint. Will admit to hospitalist. Hypertensive, given hydralazine. Not on BP meds at facility.    Wandra Arthurs, MD 06/30/14 2242

## 2014-06-30 NOTE — ED Notes (Signed)
Per EMS, Pt from memory care, Pt was found in dining room beside her wheelchair. Pt has alzheimer's. Pt c/o pain in R leg and R wrist. No obvious deformity. Pain mostly in back of R knee. Denies pain in hips. No bruising or redness. No skin tears. Pt A&O to baseline. Family is on their way. Pt fell two weeks ago and saw her PCP. Pain could be from last fall. Pt ambulatory for short distances with assistance.

## 2014-06-30 NOTE — Progress Notes (Signed)
CSW met with the patient's daughter while she was in xray.  Patient's daughter reports that the she been at the [facility] for 2 years but on the memory unit the last year.  She reports that the patient is even more confused now then any other day she visits with her.  She has had multiple falls and today fell on her way back from the dinner area.     Chesley Noon, MSW, [place], 06/30/2014 Evening Clinical Social Worker [phone]

## 2014-06-30 NOTE — ED Notes (Signed)
Bed: WA20 Expected date:  Expected time:  Means of arrival:  Comments: EMS-fall 

## 2014-06-30 NOTE — ED Notes (Signed)
Ortho at bedside.

## 2014-06-30 NOTE — ED Notes (Signed)
Patient transported to X-ray 

## 2014-07-01 ENCOUNTER — Encounter (HOSPITAL_COMMUNITY): Payer: Self-pay | Admitting: Anesthesiology

## 2014-07-01 ENCOUNTER — Encounter (HOSPITAL_COMMUNITY)
Admission: EM | Disposition: A | Discharge status: Skilled Nursing Facility | Payer: Self-pay | Source: Home / Self Care | Attending: Internal Medicine

## 2014-07-01 ENCOUNTER — Encounter (HOSPITAL_COMMUNITY): Payer: Medicare Other | Admitting: Anesthesiology

## 2014-07-01 ENCOUNTER — Inpatient Hospital Stay (HOSPITAL_COMMUNITY): Payer: Medicare Other

## 2014-07-01 ENCOUNTER — Inpatient Hospital Stay (HOSPITAL_COMMUNITY): Payer: Medicare Other | Admitting: Anesthesiology

## 2014-07-01 DIAGNOSIS — N179 Acute kidney failure, unspecified: Secondary | ICD-10-CM | POA: Diagnosis not present

## 2014-07-01 DIAGNOSIS — D72829 Elevated white blood cell count, unspecified: Secondary | ICD-10-CM | POA: Diagnosis present

## 2014-07-01 DIAGNOSIS — E8779 Other fluid overload: Secondary | ICD-10-CM | POA: Diagnosis not present

## 2014-07-01 DIAGNOSIS — M259 Joint disorder, unspecified: Secondary | ICD-10-CM | POA: Diagnosis not present

## 2014-07-01 DIAGNOSIS — F0390 Unspecified dementia without behavioral disturbance: Secondary | ICD-10-CM | POA: Diagnosis not present

## 2014-07-01 DIAGNOSIS — I10 Essential (primary) hypertension: Secondary | ICD-10-CM

## 2014-07-01 DIAGNOSIS — F03918 Unspecified dementia, unspecified severity, with other behavioral disturbance: Secondary | ICD-10-CM

## 2014-07-01 DIAGNOSIS — F0391 Unspecified dementia with behavioral disturbance: Secondary | ICD-10-CM

## 2014-07-01 DIAGNOSIS — S72143A Displaced intertrochanteric fracture of unspecified femur, initial encounter for closed fracture: Secondary | ICD-10-CM | POA: Diagnosis not present

## 2014-07-01 DIAGNOSIS — S72109A Unspecified trochanteric fracture of unspecified femur, initial encounter for closed fracture: Secondary | ICD-10-CM | POA: Diagnosis not present

## 2014-07-01 DIAGNOSIS — S52123A Displaced fracture of head of unspecified radius, initial encounter for closed fracture: Secondary | ICD-10-CM | POA: Diagnosis not present

## 2014-07-01 HISTORY — PX: ORIF HIP FRACTURE: SHX2125

## 2014-07-01 LAB — URINALYSIS, ROUTINE W REFLEX MICROSCOPIC
BILIRUBIN URINE: NEGATIVE
Glucose, UA: NEGATIVE mg/dL
Hgb urine dipstick: NEGATIVE
Ketones, ur: NEGATIVE mg/dL
Leukocytes, UA: NEGATIVE
NITRITE: NEGATIVE
PH: 6.5 (ref 5.0–8.0)
Protein, ur: NEGATIVE mg/dL
SPECIFIC GRAVITY, URINE: 1.016 (ref 1.005–1.030)
Urobilinogen, UA: 1 mg/dL (ref 0.0–1.0)

## 2014-07-01 LAB — BASIC METABOLIC PANEL
Anion gap: 13 (ref 5–15)
BUN: 16 mg/dL (ref 6–23)
CHLORIDE: 102 meq/L (ref 96–112)
CO2: 24 mEq/L (ref 19–32)
Calcium: 8.9 mg/dL (ref 8.4–10.5)
Creatinine, Ser: 0.58 mg/dL (ref 0.50–1.10)
GFR calc Af Amer: 88 mL/min — ABNORMAL LOW (ref >90)
GFR calc non Af Amer: 76 mL/min — ABNORMAL LOW (ref >90)
GLUCOSE: 128 mg/dL — AB (ref 70–99)
POTASSIUM: 3.5 meq/L — AB (ref 3.7–5.3)
SODIUM: 139 meq/L (ref 137–147)

## 2014-07-01 LAB — CBC WITH DIFFERENTIAL/PLATELET
Basophils Absolute: 0 10*3/uL (ref 0.0–0.1)
Basophils Relative: 0 % (ref 0–1)
Eosinophils Absolute: 0 10*3/uL (ref 0.0–0.7)
Eosinophils Relative: 0 % (ref 0–5)
HEMATOCRIT: 34.8 % — AB (ref 36.0–46.0)
HEMOGLOBIN: 11.7 g/dL — AB (ref 12.0–15.0)
LYMPHS ABS: 1.4 10*3/uL (ref 0.7–4.0)
LYMPHS PCT: 11 % — AB (ref 12–46)
MCH: 30.5 pg (ref 26.0–34.0)
MCHC: 33.6 g/dL (ref 30.0–36.0)
MCV: 90.9 fL (ref 78.0–100.0)
MONO ABS: 0.8 10*3/uL (ref 0.1–1.0)
Monocytes Relative: 7 % (ref 3–12)
NEUTROS ABS: 10.4 10*3/uL — AB (ref 1.7–7.7)
Neutrophils Relative %: 82 % — ABNORMAL HIGH (ref 43–77)
Platelets: 151 10*3/uL (ref 150–400)
RBC: 3.83 MIL/uL — AB (ref 3.87–5.11)
RDW: 14 % (ref 11.5–15.5)
WBC: 12.7 10*3/uL — AB (ref 4.0–10.5)

## 2014-07-01 LAB — TYPE AND SCREEN
ABO/RH(D): O POS
Antibody Screen: NEGATIVE

## 2014-07-01 LAB — MAGNESIUM: Magnesium: 1.9 mg/dL (ref 1.5–2.5)

## 2014-07-01 LAB — ABO/RH: ABO/RH(D): O POS

## 2014-07-01 LAB — PROTIME-INR
INR: 1.11 (ref 0.00–1.49)
Prothrombin Time: 14.3 seconds (ref 11.6–15.2)

## 2014-07-01 SURGERY — OPEN REDUCTION INTERNAL FIXATION HIP
Anesthesia: General | Site: Hip | Laterality: Right

## 2014-07-01 SURGERY — OPEN REDUCTION INTERNAL FIXATION HIP
Anesthesia: Choice | Laterality: Right

## 2014-07-01 MED ORDER — ETOMIDATE 2 MG/ML IV SOLN
INTRAVENOUS | Status: DC | PRN
  Administered 2014-07-01: 10 mg via INTRAVENOUS

## 2014-07-01 MED ORDER — CEFAZOLIN SODIUM-DEXTROSE 2-3 GM-% IV SOLR
2.0000 g | Freq: Once | INTRAVENOUS | Status: AC
  Administered 2014-07-01: 2 g via INTRAVENOUS

## 2014-07-01 MED ORDER — POTASSIUM CHLORIDE CRYS ER 20 MEQ PO TBCR
40.0000 meq | EXTENDED_RELEASE_TABLET | Freq: Once | ORAL | Status: DC
  Filled 2014-07-01: qty 2

## 2014-07-01 MED ORDER — ACETAMINOPHEN 325 MG PO TABS: 650.0000 mg | ORAL_TABLET | Freq: Four times a day (QID) | ORAL | Status: DC | PRN

## 2014-07-01 MED ORDER — METHOCARBAMOL 500 MG PO TABS: 500.0000 mg | ORAL_TABLET | Freq: Four times a day (QID) | ORAL | Status: DC | PRN

## 2014-07-01 MED ORDER — PHENOL 1.4 % MT LIQD
1.0000 | OROMUCOSAL | Status: DC | PRN
  Filled 2014-07-01: qty 177

## 2014-07-01 MED ORDER — SUCCINYLCHOLINE CHLORIDE 20 MG/ML IJ SOLN
INTRAMUSCULAR | Status: DC | PRN
  Administered 2014-07-01: 100 mg via INTRAVENOUS

## 2014-07-01 MED ORDER — ONDANSETRON HCL 4 MG/2ML IJ SOLN: 4.0000 mg | Freq: Four times a day (QID) | INTRAMUSCULAR | Status: DC | PRN

## 2014-07-01 MED ORDER — SODIUM CHLORIDE 0.9 % IV SOLN
INTRAVENOUS | Status: DC
  Administered 2014-07-01: 09:00:00 via INTRAVENOUS
  Filled 2014-07-01 (×7): qty 1000

## 2014-07-01 MED ORDER — POTASSIUM CHLORIDE 2 MEQ/ML IV SOLN
INTRAVENOUS | Status: DC
  Administered 2014-07-01: 23:00:00 via INTRAVENOUS
  Filled 2014-07-01 (×5): qty 1000

## 2014-07-01 MED ORDER — DEXAMETHASONE SODIUM PHOSPHATE 10 MG/ML IJ SOLN
INTRAMUSCULAR | Status: DC | PRN
  Administered 2014-07-01: 10 mg via INTRAVENOUS

## 2014-07-01 MED ORDER — MORPHINE SULFATE 2 MG/ML IJ SOLN: 0.5000 mg | INTRAMUSCULAR | Status: DC | PRN

## 2014-07-01 MED ORDER — METHOCARBAMOL 1000 MG/10ML IJ SOLN
500.0000 mg | Freq: Four times a day (QID) | INTRAVENOUS | Status: DC | PRN
  Filled 2014-07-01: qty 5

## 2014-07-01 MED ORDER — SENNOSIDES-DOCUSATE SODIUM 8.6-50 MG PO TABS: 1.0000 | ORAL_TABLET | Freq: Every evening | ORAL | Status: DC | PRN

## 2014-07-01 MED ORDER — LORAZEPAM 2 MG/ML IJ SOLN
0.5000 mg | Freq: Once | INTRAMUSCULAR | Status: AC
Start: 2014-07-01 — End: 2014-07-01
  Administered 2014-07-01: 0.5 mg via INTRAVENOUS
  Filled 2014-07-01: qty 1

## 2014-07-01 MED ORDER — FENTANYL CITRATE 0.05 MG/ML IJ SOLN
INTRAMUSCULAR | Status: AC
  Filled 2014-07-01: qty 2

## 2014-07-01 MED ORDER — ONDANSETRON HCL 4 MG PO TABS
4.0000 mg | ORAL_TABLET | Freq: Four times a day (QID) | ORAL | Status: DC | PRN
  Administered 2014-07-02 – 2014-07-03 (×2): 4 mg via ORAL
  Filled 2014-07-01 (×2): qty 1

## 2014-07-01 MED ORDER — POLYETHYLENE GLYCOL 3350 17 G PO PACK
17.0000 g | PACK | Freq: Every day | ORAL | Status: DC
  Administered 2014-07-03 – 2014-07-04 (×2): 17 g via ORAL
  Filled 2014-07-01 (×4): qty 1

## 2014-07-01 MED ORDER — DOCUSATE SODIUM 100 MG PO CAPS
100.0000 mg | ORAL_CAPSULE | Freq: Two times a day (BID) | ORAL | Status: DC
  Administered 2014-07-02 – 2014-07-04 (×5): 100 mg via ORAL
  Filled 2014-07-01 (×7): qty 1

## 2014-07-01 MED ORDER — ASPIRIN EC 325 MG PO TBEC
325.0000 mg | DELAYED_RELEASE_TABLET | Freq: Every day | ORAL | Status: DC
  Filled 2014-07-01 (×2): qty 1

## 2014-07-01 MED ORDER — LABETALOL HCL 5 MG/ML IV SOLN: 5.0000 mg | INTRAVENOUS | Status: DC | PRN

## 2014-07-01 MED ORDER — CEFAZOLIN SODIUM-DEXTROSE 2-3 GM-% IV SOLR: INTRAVENOUS | Status: DC | PRN

## 2014-07-01 MED ORDER — ALUM & MAG HYDROXIDE-SIMETH 200-200-20 MG/5ML PO SUSP: 30.0000 mL | ORAL | Status: DC | PRN

## 2014-07-01 MED ORDER — LACTATED RINGERS IV SOLN: INTRAVENOUS | Status: DC

## 2014-07-01 MED ORDER — ETOMIDATE 2 MG/ML IV SOLN
INTRAVENOUS | Status: AC
  Filled 2014-07-01: qty 10

## 2014-07-01 MED ORDER — HYDRALAZINE HCL 20 MG/ML IJ SOLN
10.0000 mg | INTRAMUSCULAR | Status: DC | PRN
  Administered 2014-07-01: 10 mg via INTRAVENOUS

## 2014-07-01 MED ORDER — ACETAMINOPHEN 650 MG RE SUPP: 650.0000 mg | Freq: Four times a day (QID) | RECTAL | Status: DC | PRN

## 2014-07-01 MED ORDER — HYDRALAZINE HCL 20 MG/ML IJ SOLN
INTRAMUSCULAR | Status: AC
  Filled 2014-07-01: qty 1

## 2014-07-01 MED ORDER — LACTATED RINGERS IV SOLN
INTRAVENOUS | Status: DC | PRN
  Administered 2014-07-01: 18:00:00 via INTRAVENOUS

## 2014-07-01 MED ORDER — RISPERIDONE 0.25 MG PO TABS: 0.2500 mg | ORAL_TABLET | Freq: Every day | ORAL | Status: DC

## 2014-07-01 MED ORDER — DOCUSATE SODIUM 100 MG PO CAPS
100.0000 mg | ORAL_CAPSULE | Freq: Two times a day (BID) | ORAL | Status: DC
  Filled 2014-07-01 (×2): qty 1

## 2014-07-01 MED ORDER — 0.9 % SODIUM CHLORIDE (POUR BTL) OPTIME
TOPICAL | Status: DC | PRN
  Administered 2014-07-01: 1000 mL

## 2014-07-01 MED ORDER — HYDROMORPHONE HCL PF 1 MG/ML IJ SOLN: 0.2500 mg | INTRAMUSCULAR | Status: DC | PRN

## 2014-07-01 MED ORDER — HYDROCODONE-ACETAMINOPHEN 5-325 MG PO TABS
1.0000 | ORAL_TABLET | Freq: Four times a day (QID) | ORAL | Status: DC | PRN
  Administered 2014-07-02 – 2014-07-03 (×4): 1 via ORAL
  Filled 2014-07-01 (×6): qty 1

## 2014-07-01 MED ORDER — LACTATED RINGERS IV BOLUS (SEPSIS)
500.0000 mL | Freq: Once | INTRAVENOUS | Status: AC
  Administered 2014-07-01: 500 mL via INTRAVENOUS

## 2014-07-01 MED ORDER — HYDRALAZINE HCL 20 MG/ML IJ SOLN
10.0000 mg | Freq: Four times a day (QID) | INTRAMUSCULAR | Status: DC | PRN
  Administered 2014-07-01: 10 mg via INTRAVENOUS
  Filled 2014-07-01: qty 0.5

## 2014-07-01 MED ORDER — ONDANSETRON HCL 4 MG/2ML IJ SOLN
INTRAMUSCULAR | Status: DC | PRN
  Administered 2014-07-01: 4 mg via INTRAVENOUS

## 2014-07-01 MED ORDER — AMLODIPINE BESYLATE 5 MG PO TABS
5.0000 mg | ORAL_TABLET | Freq: Every day | ORAL | Status: DC
  Administered 2014-07-02 – 2014-07-04 (×2): 5 mg via ORAL
  Filled 2014-07-01 (×4): qty 1

## 2014-07-01 MED ORDER — METOCLOPRAMIDE HCL 10 MG PO TABS
5.0000 mg | ORAL_TABLET | Freq: Three times a day (TID) | ORAL | Status: DC | PRN
  Administered 2014-07-02 – 2014-07-03 (×2): 10 mg via ORAL
  Filled 2014-07-01 (×3): qty 1

## 2014-07-01 MED ORDER — MENTHOL 3 MG MT LOZG
1.0000 | LOZENGE | OROMUCOSAL | Status: DC | PRN
  Filled 2014-07-01: qty 9

## 2014-07-01 MED ORDER — ONDANSETRON HCL 4 MG/2ML IJ SOLN
INTRAMUSCULAR | Status: AC
  Filled 2014-07-01: qty 2

## 2014-07-01 MED ORDER — VENLAFAXINE HCL ER 75 MG PO CP24
75.0000 mg | ORAL_CAPSULE | Freq: Every day | ORAL | Status: DC
  Administered 2014-07-02 – 2014-07-04 (×3): 75 mg via ORAL
  Filled 2014-07-01 (×5): qty 1

## 2014-07-01 MED ORDER — LACTATED RINGERS IV SOLN
INTRAVENOUS | Status: DC
  Administered 2014-07-01: 21:00:00 via INTRAVENOUS

## 2014-07-01 MED ORDER — CEFAZOLIN SODIUM-DEXTROSE 2-3 GM-% IV SOLR
INTRAVENOUS | Status: AC
  Filled 2014-07-01: qty 50

## 2014-07-01 MED ORDER — FENTANYL CITRATE 0.05 MG/ML IJ SOLN
INTRAMUSCULAR | Status: DC | PRN
  Administered 2014-07-01 (×2): 50 ug via INTRAVENOUS
  Administered 2014-07-01: 100 ug via INTRAVENOUS

## 2014-07-01 MED ORDER — METOCLOPRAMIDE HCL 5 MG/ML IJ SOLN: 5.0000 mg | Freq: Three times a day (TID) | INTRAMUSCULAR | Status: DC | PRN

## 2014-07-01 MED ORDER — CEFAZOLIN SODIUM 1-5 GM-% IV SOLN
1.0000 g | Freq: Four times a day (QID) | INTRAVENOUS | Status: AC
  Administered 2014-07-02 (×2): 1 g via INTRAVENOUS
  Filled 2014-07-01 (×2): qty 50

## 2014-07-01 SURGICAL SUPPLY — 34 items
ADH SKN CLS APL DERMABOND .7 (GAUZE/BANDAGES/DRESSINGS) ×1
BIT DRILL CANN LG 4.3MM (BIT) IMPLANT
BNDG GAUZE ELAST 4 BULKY (GAUZE/BANDAGES/DRESSINGS) ×2 IMPLANT
COVER PERINEAL POST (MISCELLANEOUS) ×2 IMPLANT
DERMABOND ADVANCED (GAUZE/BANDAGES/DRESSINGS) ×2
DERMABOND ADVANCED .7 DNX12 (GAUZE/BANDAGES/DRESSINGS) IMPLANT
DRAPE STERI IOBAN 125X83 (DRAPES) ×2 IMPLANT
DRILL BIT CANN LG 4.3MM (BIT) ×3
DRSG AQUACEL AG ADV 3.5X 4 (GAUZE/BANDAGES/DRESSINGS) ×2 IMPLANT
DRSG AQUACEL AG ADV 3.5X 6 (GAUZE/BANDAGES/DRESSINGS) ×2 IMPLANT
DURAPREP 26ML APPLICATOR (WOUND CARE) ×2 IMPLANT
GLOVE BIOGEL PI IND STRL 7.5 (GLOVE) IMPLANT
GLOVE BIOGEL PI INDICATOR 7.5 (GLOVE) ×2
GLOVE SURG SS PI 6.5 STRL IVOR (GLOVE) ×4 IMPLANT
GLOVE SURG SS PI 7.5 STRL IVOR (GLOVE) ×4 IMPLANT
GOWN SPEC L3 XXLG W/TWL (GOWN DISPOSABLE) ×2 IMPLANT
GOWN STRL REUS W/TWL LRG LVL3 (GOWN DISPOSABLE) ×2 IMPLANT
GUIDEPIN 3.2X17.5 THRD DISP (PIN) ×2 IMPLANT
KIT BASIN OR (CUSTOM PROCEDURE TRAY) ×2 IMPLANT
MANIFOLD NEPTUNE II (INSTRUMENTS) ×2 IMPLANT
NAIL HIP FRACT 130D 9X180 (Orthopedic Implant) ×2 IMPLANT
NS IRRIG 1000ML POUR BTL (IV SOLUTION) ×2 IMPLANT
PACK GENERAL/GYN (CUSTOM PROCEDURE TRAY) ×2 IMPLANT
POSITIONER SURGICAL ARM (MISCELLANEOUS) ×2 IMPLANT
SCREW LAG HIP NAIL 10.5X95 (Screw) ×2 IMPLANT
STAPLER VISISTAT 35W (STAPLE) ×2 IMPLANT
SUT MNCRL AB 4-0 PS2 18 (SUTURE) ×2 IMPLANT
SUT VIC AB 1 CT1 27 (SUTURE) ×3
SUT VIC AB 1 CT1 27XBRD ANTBC (SUTURE) IMPLANT
SUT VIC AB 2-0 CT1 27 (SUTURE) ×6
SUT VIC AB 2-0 CT1 27XBRD (SUTURE) IMPLANT
SUT VIC AB 2-0 CT1 TAPERPNT 27 (SUTURE) IMPLANT
TOWEL NATURAL 10PK STERILE (DISPOSABLE) ×2 IMPLANT
WATER STERILE IRR 1500ML POUR (IV SOLUTION) ×2 IMPLANT

## 2014-07-01 NOTE — Progress Notes (Signed)
Clinical Social Work Department BRIEF PSYCHOSOCIAL ASSESSMENT 07/01/2014  Patient:  Bethany Fox, Bethany Fox     Account Number:  [id number]     [place] date:  06/30/2014  Clinical Social Worker:  Renold Genta  Date/Time:  07/01/2014 02:37 PM  Referred by:  Physician  Date Referred:  07/01/2014 Referred for  Other - See comment   Other Referral:   Admitted from: [facility]   Interview type:  Family Other interview type:   patient's daughter, Caren Griffins at bedside    PSYCHOSOCIAL DATA Living Status:  [place] Admitted from facility:  [facility] Level of care:  Assisted Living Primary support name:  Joan Flores (dtr) h#: [phone] c#: [id number] Primary support relationship to patient:  CHILD, ADULT Degree of support available:   good    CURRENT CONCERNS Current Concerns  Post-Acute Placement   Other Concerns:    SOCIAL WORK ASSESSMENT / PLAN CSW reviewed patient's chart and noted that she was admitted from [facility].   Assessment/plan status:  Information/Referral to [employer] Other assessment/ plan:   Information/referral to community resources:   CSW completed FL2 and faxed information to [facility].    PATIENT'S/FAMILY'S RESPONSE TO PLAN OF CARE: Patient's daughter at bedside, Caren Griffins states that they plan for patient to return back to [facility] at discharge rather than SNF. Patient's daughter states that she's been living at [facility] for the past 2 years and recently moved from the ALF to memory care section within the past 6 months. Daughters feel that it would be more helpful for patient to go back to a familiar environment.       Raynaldo Opitz, [facility] Clinical Social Worker cell #: [phone]

## 2014-07-01 NOTE — Progress Notes (Signed)
   Subjective: Right hip IT fracture   Patient resting comfortably in bed, apparently has been since she had ativan. Apparently very agitated throughout the night, pulling at her cast and trying to move from bed.  Objective:   VITALS:   Filed Vitals:   07/01/14 0647  BP: 151/60  Pulse: 92  Temp: 98.5 F (36.9 C)  Resp: 18    No cellulitis present Compartment soft  LABS  Recent Labs  06/30/14 2149 07/01/14 0420  HGB 12.5 11.7*  HCT 37.1 34.8*  WBC 12.2* 12.7*  PLT 162 151     Recent Labs  06/30/14 2149 07/01/14 0420  NA 138 139  K 3.6* 3.5*  BUN 16 16  CREATININE 0.65 0.58  GLUCOSE 127* 128*     Assessment/Plan: Right hip IT fracture  Discussed surgery with pt's daughters. Risks, benefits and expectations were discussed with the patient's daughters.  Risks including but not limited to the risk of anesthesia, blood clots, nerve damage, blood vessel damage, failure of the prosthesis, infection and up to and including death.  Also discussed the risks, benefits and expectations of not having the surgery.  Both daughter appear to be in agreement to proceed with surgery to give there mother pain management and best chance to not be confided to bed, which she doesn't do well with.  Already NPO Will obtain consent, will need to have POA confirmation Plan would be for IM nailing of the right hip per Dr. Alvan Dame today.      [place]   PAC  07/01/2014, 10:25 AM

## 2014-07-01 NOTE — Transfer of Care (Signed)
Immediate Anesthesia Transfer of Care Note  Patient: Bethany Fox  Procedure(s) Performed: Procedure(s): OPEN REDUCTION INTERNAL FIXATION HIP (Right)  Patient Location: PACU  Anesthesia Type:General  Level of Consciousness: awake, sedated and patient cooperative  Airway & Oxygen Therapy: Patient Spontanous Breathing and Patient connected to face mask oxygen  Post-op Assessment: Report given to PACU RN and Post -op Vital signs reviewed and stable  Post vital signs: Reviewed and stable  Complications: No apparent anesthesia complications

## 2014-07-01 NOTE — Consult Note (Signed)
Patient ID: Bethany Fox MRN: [id number] DOB/AGE: 09-16-1917 80 y.o.  Admit date: 06/30/2014  Admission Diagnoses:  Principal Problem:   Hip fracture, right Active Problems:   Dementia   Fall   Right radial head fracture   HTN (hypertension)   Hip fracture   HPI: Patient being seen for right hip fracture and right radial head fracture.  Has a hx of dementia with multiple recent falls per daughter.  Fell earlier this evening but the circumstances are unclear due to patient being a poor historian.    Past Medical History: Past Medical History  Diagnosis Date  . Gastric ulcer   . Cancer     breast  . Depression   . Dementia   . Breast cancer   . H/O mastectomy     Left side     Surgical History: Past Surgical History  Procedure Laterality Date  . Cholecystectomy    . Mastectomy      Family History: No family history on file.  Social History: History   Social History  . Marital Status: Widowed    Spouse Name: N/A    Number of Children: N/A  . Years of Education: N/A   Occupational History  . Not on file.   Social History Main Topics  . Smoking status: Never Smoker   . Smokeless tobacco: Not on file  . Alcohol Use: No  . Drug Use: No  . Sexual Activity: Not on file   Other Topics Concern  . Not on file   Social History Narrative  . No narrative on file    Allergies: Adhesive and Ciprofloxacin  Medications: I have reviewed the patient's current medications.  Vital Signs: Patient Vitals for the past 24 hrs:  BP Temp Temp src Pulse Resp SpO2  07/01/14 0034 155/64 mmHg 97.8 F (36.6 C) Axillary 107 18 98 %  06/30/14 2340 147/64 mmHg - - 100 16 95 %  06/30/14 2325 193/75 mmHg - - - 17 -  06/30/14 2324 193/75 mmHg - - - - -  06/30/14 2230 193/78 mmHg 97.8 F (36.6 C) - 98 19 95 %  06/30/14 2005 213/79 mmHg - - 88 - 99 %  06/30/14 1953 195/79 mmHg 97.7 F (36.5 C) Oral 86 18 97 %    Radiology: Dg Chest 2 View  06/30/2014    CLINICAL DATA:  Fall, right forearm pain.  EXAM: CHEST  2 VIEW  COMPARISON:  Chest radiograph April 18, 2014  FINDINGS: Cardiac silhouette is upper limits of normal in size, unchanged. Mediastinal silhouette is nonsuspicious, mildly calcified aortic knob. Similar chronic interstitial changes without pleural effusions or focal consolidations. No pneumothorax, biapical pleural thickening.  Osteopenia.  Surgical clips project in left axilla.  IMPRESSION: Borderline cardiomegaly and chronic interstitial changes.   Electronically Signed   By: Elon Alas   On: 06/30/2014 21:01   Dg Pelvis 1-2 Views  06/30/2014   CLINICAL DATA:  Fall  EXAM: PELVIS - 1-2 VIEW  COMPARISON:  None.  FINDINGS: Nondisplaced right intertrochanteric fracture. No dislocation. Moderate osteoarthritis of the right hip.  No left hip fracture. Generalized osteopenia. Mild degenerative changes of bilateral SI joints and lower lumbar spine.  IMPRESSION: Nondisplaced right intertrochanteric fracture without dislocation.   Electronically Signed   By: Kathreen Devoid   On: 06/30/2014 21:00   Dg Forearm Right  06/30/2014   CLINICAL DATA:  Fall  EXAM: RIGHT FOREARM - 2 VIEW  COMPARISON:  None.  FINDINGS: Displaced  right radial head fracture with the volar fragment rotated and displaced anteriorly.  There is no other fracture or dislocation.  There is generalized osteopenia. There are degenerative changes of the triscaphe joint.  IMPRESSION: Displaced right radial head fracture.   Electronically Signed   By: Kathreen Devoid   On: 06/30/2014 21:02   Dg Femur Right  06/30/2014   CLINICAL DATA:  Fall.  Right femur pain.  EXAM: RIGHT FEMUR - 2 VIEW  COMPARISON:  None.  FINDINGS: A mildly displaced intertrochanteric fracture is present. Chronic degenerative changes are noted the hip with significant loss of joint space. Mild degenerative changes are noted at the knee. No additional fractures are evident.  IMPRESSION: 1. Intertrochanteric fracture of the  right femur. 2. Chronic degenerative changes of the hip and knee.   Electronically Signed   By: Lawrence Santiago M.D.   On: 06/30/2014 21:04   Ct Head Wo Contrast  06/30/2014   CLINICAL DATA:  Status post fall  EXAM: CT HEAD WITHOUT CONTRAST  CT CERVICAL SPINE WITHOUT CONTRAST  TECHNIQUE: Multidetector CT imaging of the head and cervical spine was performed following the standard protocol without intravenous contrast. Multiplanar CT image reconstructions of the cervical spine were also generated.  COMPARISON:  04/18/2014 head CT  FINDINGS: CT HEAD FINDINGS  Skull and Sinuses:Negative for fracture or destructive process. The mastoids, middle ears, and imaged paranasal sinuses are clear.  Orbits: Bilateral cataract resection  Brain: No evidence of acute abnormality, such as acute infarction, hemorrhage, hydrocephalus, or mass lesion/mass effect. There is generalized cerebral volume loss which is age commensurate. Mild chronic small vessel disease, best seen around the lateral ventricles, also expected for age.  CT CERVICAL SPINE FINDINGS  There is no evidence of acute fracture or traumatic subluxation. Diffuse degenerative disc narrowing and facet osteoarthritis. There is no notable osseous canal or foraminal stenosis. C7-T1 posterior element ankylosis is noted. No prevertebral swelling or gross cervical canal hematoma. There is reticular scarring in the right more than left apices, with bronchiectasis noted on the right.  IMPRESSION: 1. No evidence of acute intracranial or cervical spine injury. 2. Intracranial and cervical spine senescent changes are noted above. 3. Postinfectious biapical lung scarring with bronchiectasis.   Electronically Signed   By: Jorje Guild M.D.   On: 06/30/2014 21:48   Ct Cervical Spine Wo Contrast  06/30/2014   CLINICAL DATA:  Status post fall  EXAM: CT HEAD WITHOUT CONTRAST  CT CERVICAL SPINE WITHOUT CONTRAST  TECHNIQUE: Multidetector CT imaging of the head and cervical spine was  performed following the standard protocol without intravenous contrast. Multiplanar CT image reconstructions of the cervical spine were also generated.  COMPARISON:  04/18/2014 head CT  FINDINGS: CT HEAD FINDINGS  Skull and Sinuses:Negative for fracture or destructive process. The mastoids, middle ears, and imaged paranasal sinuses are clear.  Orbits: Bilateral cataract resection  Brain: No evidence of acute abnormality, such as acute infarction, hemorrhage, hydrocephalus, or mass lesion/mass effect. There is generalized cerebral volume loss which is age commensurate. Mild chronic small vessel disease, best seen around the lateral ventricles, also expected for age.  CT CERVICAL SPINE FINDINGS  There is no evidence of acute fracture or traumatic subluxation. Diffuse degenerative disc narrowing and facet osteoarthritis. There is no notable osseous canal or foraminal stenosis. C7-T1 posterior element ankylosis is noted. No prevertebral swelling or gross cervical canal hematoma. There is reticular scarring in the right more than left apices, with bronchiectasis noted on the right.  IMPRESSION:  1. No evidence of acute intracranial or cervical spine injury. 2. Intracranial and cervical spine senescent changes are noted above. 3. Postinfectious biapical lung scarring with bronchiectasis.   Electronically Signed   By: Jorje Guild M.D.   On: 06/30/2014 21:48    Labs:  Recent Labs  06/30/14 2149  WBC 12.2*  RBC 4.10  HCT 37.1  PLT 162    Recent Labs  06/30/14 2149  NA 138  K 3.6*  CL 99  CO2 26  BUN 16  CREATININE 0.65  GLUCOSE 127*  CALCIUM 9.0    Recent Labs  06/30/14 2149  INR 1.06    Review of Systems: Review of Systems  Constitutional: Negative.   Musculoskeletal: Positive for joint pain.  Psychiatric/Behavioral:       Hx dementia    Physical Exam: Alert.  Bucks traction applied. NVI.  bilat calves nontender.  Skin warm and dry.  Splint on right upper ext intact.  Moves  fingers well.    Assessment: 1) right hip IT fracture 2) right radial head fracture 3) dementia  Plan: Case discussed with my attending Dr Melina Schools.  He will talk to Dr Paralee Cancel to discuss operative vs nonoperative treatment of the hip fracture.  Elbow will likely be managed conservatively with immobilization only.  Patient's daughter in the room and all questions answered.  Continue bed rest for now and bucks traction.       Agree with above.  Reviewed fracture pattern, risks and benefits discussed.  Will plan to proceed with ORIF of the right hip to help with pain control and allow for restoration of function. NPO Surgery tonight Consent signed

## 2014-07-01 NOTE — Interval H&P Note (Signed)
History and Physical Interval Note:  07/01/2014 6:30 PM  Bethany Fox  has presented today for surgery, with the diagnosis of RIGHT HIP FRACTURE  The various methods of treatment have been discussed with the patient and family. After consideration of risks, benefits and other options for treatment, the patient has consented to  Procedure(s): OPEN REDUCTION INTERNAL FIXATION HIP (Right) as a surgical intervention .  The patient's history has been reviewed, patient examined, no change in status, stable for surgery.  I have reviewed the patient's chart and labs.  Questions were answered to the patient's satisfaction.     Mauri Pole

## 2014-07-01 NOTE — Progress Notes (Signed)
[facility] PROGRESS NOTE  Bethany Fox ATF:[id number] DOB: 1917/10/24 DOA: 06/30/2014 PCP: Jenna Luo TOM, MD  Assessment/Plan: #1 right intertrochanteric femur fracture Secondary to mechanical fall. Patient has been assessed by orthopedics and patient for IM nailing today per Dr. Alvan Dame of orthopedics. Per orthopedics.  #2 right radial head fracture Per orthopedics to be managed conservatively with immobilization only. Per orthopedics.  #3 leukocytosis Likely reactive leukocytosis. Chest x-ray is negative. Check a UA with cultures and sensitivities.  #4 dementia  #5 falls Fall precautions. PT/OT.  #6 hypertension Stable. Follow.  Code Status: DO NOT RESUSCITATE Family Communication: The patient no family at bedside. Disposition Plan: Probable skilled nursing facility when medically stable.   Consultants:  Orthopedics: Dr. Rolena Infante 07/01/2014  Procedures:  CT head/CT C-spine 06/30/2014  X-ray of the right femur 06/30/2014  X-ray of the right forearm 06/30/2014  X-ray of the pelvis 06/30/2014  Chest x-ray 06/30/2014  Antibiotics:  None  HPI/Subjective: Patient sleeping. Events of agitation overnight noted.  Objective: Filed Vitals:   07/01/14 0647  BP: 151/60  Pulse: 92  Temp: 98.5 F (36.9 C)  Resp: 18   No intake or output data in the 24 hours ending 07/01/14 1051 Filed Weights   07/01/14 0700  Weight: 60.6 kg (133 lb 9.6 oz)    Exam:   General:  Sleeping  Cardiovascular: Regular rate rhythm no murmurs rubs or gallops  Respiratory: Clear to auscultation bilaterally in anterior lung fields  Abdomen: Soft, nontender, nondistended, positive bowel sounds  Musculoskeletal: No clubbing cyanosis. Right upper extremity in the immobilizer. Right lower extremity in Buck's traction.  Data Reviewed: Basic Metabolic Panel:  Recent Labs Lab 06/30/14 2149 07/01/14 0420  NA 138 139  K 3.6* 3.5*  CL 99 102  CO2 26 24  GLUCOSE 127* 128*   BUN 16 16  CREATININE 0.65 0.58  CALCIUM 9.0 8.9  MG  --  1.9   Liver Function Tests:  Recent Labs Lab 06/30/14 2149  AST 19  ALT 11  ALKPHOS 124*  BILITOT 0.5  PROT 7.3  ALBUMIN 3.7   No results found for this basename: LIPASE, AMYLASE,  in the last 168 hours No results found for this basename: AMMONIA,  in the last 168 hours CBC:  Recent Labs Lab 06/30/14 2149 07/01/14 0420  WBC 12.2* 12.7*  NEUTROABS 10.4* 10.4*  HGB 12.5 11.7*  HCT 37.1 34.8*  MCV 90.5 90.9  PLT 162 151   Cardiac Enzymes: No results found for this basename: CKTOTAL, CKMB, CKMBINDEX, TROPONINI,  in the last 168 hours BNP (last 3 results) No results found for this basename: PROBNP,  in the last 8760 hours CBG: No results found for this basename: GLUCAP,  in the last 168 hours  No results found for this or any previous visit (from the past 240 hour(s)).   Studies: Dg Chest 2 View  06/30/2014   CLINICAL DATA:  Fall, right forearm pain.  EXAM: CHEST  2 VIEW  COMPARISON:  Chest radiograph April 18, 2014  FINDINGS: Cardiac silhouette is upper limits of normal in size, unchanged. Mediastinal silhouette is nonsuspicious, mildly calcified aortic knob. Similar chronic interstitial changes without pleural effusions or focal consolidations. No pneumothorax, biapical pleural thickening.  Osteopenia.  Surgical clips project in left axilla.  IMPRESSION: Borderline cardiomegaly and chronic interstitial changes.   Electronically Signed   By: Elon Alas   On: 06/30/2014 21:01   Dg Pelvis 1-2 Views  06/30/2014   CLINICAL DATA:  Fall  EXAM:  PELVIS - 1-2 VIEW  COMPARISON:  None.  FINDINGS: Nondisplaced right intertrochanteric fracture. No dislocation. Moderate osteoarthritis of the right hip.  No left hip fracture. Generalized osteopenia. Mild degenerative changes of bilateral SI joints and lower lumbar spine.  IMPRESSION: Nondisplaced right intertrochanteric fracture without dislocation.   Electronically Signed    By: Kathreen Devoid   On: 06/30/2014 21:00   Dg Forearm Right  06/30/2014   CLINICAL DATA:  Fall  EXAM: RIGHT FOREARM - 2 VIEW  COMPARISON:  None.  FINDINGS: Displaced right radial head fracture with the volar fragment rotated and displaced anteriorly.  There is no other fracture or dislocation.  There is generalized osteopenia. There are degenerative changes of the triscaphe joint.  IMPRESSION: Displaced right radial head fracture.   Electronically Signed   By: Kathreen Devoid   On: 06/30/2014 21:02   Dg Femur Right  06/30/2014   CLINICAL DATA:  Fall.  Right femur pain.  EXAM: RIGHT FEMUR - 2 VIEW  COMPARISON:  None.  FINDINGS: A mildly displaced intertrochanteric fracture is present. Chronic degenerative changes are noted the hip with significant loss of joint space. Mild degenerative changes are noted at the knee. No additional fractures are evident.  IMPRESSION: 1. Intertrochanteric fracture of the right femur. 2. Chronic degenerative changes of the hip and knee.   Electronically Signed   By: Lawrence Santiago M.D.   On: 06/30/2014 21:04   Ct Head Wo Contrast  06/30/2014   CLINICAL DATA:  Status post fall  EXAM: CT HEAD WITHOUT CONTRAST  CT CERVICAL SPINE WITHOUT CONTRAST  TECHNIQUE: Multidetector CT imaging of the head and cervical spine was performed following the standard protocol without intravenous contrast. Multiplanar CT image reconstructions of the cervical spine were also generated.  COMPARISON:  04/18/2014 head CT  FINDINGS: CT HEAD FINDINGS  Skull and Sinuses:Negative for fracture or destructive process. The mastoids, middle ears, and imaged paranasal sinuses are clear.  Orbits: Bilateral cataract resection  Brain: No evidence of acute abnormality, such as acute infarction, hemorrhage, hydrocephalus, or mass lesion/mass effect. There is generalized cerebral volume loss which is age commensurate. Mild chronic small vessel disease, best seen around the lateral ventricles, also expected for age.  CT  CERVICAL SPINE FINDINGS  There is no evidence of acute fracture or traumatic subluxation. Diffuse degenerative disc narrowing and facet osteoarthritis. There is no notable osseous canal or foraminal stenosis. C7-T1 posterior element ankylosis is noted. No prevertebral swelling or gross cervical canal hematoma. There is reticular scarring in the right more than left apices, with bronchiectasis noted on the right.  IMPRESSION: 1. No evidence of acute intracranial or cervical spine injury. 2. Intracranial and cervical spine senescent changes are noted above. 3. Postinfectious biapical lung scarring with bronchiectasis.   Electronically Signed   By: Jorje Guild M.D.   On: 06/30/2014 21:48   Ct Cervical Spine Wo Contrast  06/30/2014   CLINICAL DATA:  Status post fall  EXAM: CT HEAD WITHOUT CONTRAST  CT CERVICAL SPINE WITHOUT CONTRAST  TECHNIQUE: Multidetector CT imaging of the head and cervical spine was performed following the standard protocol without intravenous contrast. Multiplanar CT image reconstructions of the cervical spine were also generated.  COMPARISON:  04/18/2014 head CT  FINDINGS: CT HEAD FINDINGS  Skull and Sinuses:Negative for fracture or destructive process. The mastoids, middle ears, and imaged paranasal sinuses are clear.  Orbits: Bilateral cataract resection  Brain: No evidence of acute abnormality, such as acute infarction, hemorrhage, hydrocephalus, or mass lesion/mass effect.  There is generalized cerebral volume loss which is age commensurate. Mild chronic small vessel disease, best seen around the lateral ventricles, also expected for age.  CT CERVICAL SPINE FINDINGS  There is no evidence of acute fracture or traumatic subluxation. Diffuse degenerative disc narrowing and facet osteoarthritis. There is no notable osseous canal or foraminal stenosis. C7-T1 posterior element ankylosis is noted. No prevertebral swelling or gross cervical canal hematoma. There is reticular scarring in the right  more than left apices, with bronchiectasis noted on the right.  IMPRESSION: 1. No evidence of acute intracranial or cervical spine injury. 2. Intracranial and cervical spine senescent changes are noted above. 3. Postinfectious biapical lung scarring with bronchiectasis.   Electronically Signed   By: Jorje Guild M.D.   On: 06/30/2014 21:48    Scheduled Meds: . potassium chloride  40 mEq Oral Once  . risperiDONE  0.25 mg Oral QHS  . venlafaxine XR  75 mg Oral Q breakfast   Continuous Infusions: . sodium chloride 0.9 % 1,000 mL infusion 75 mL/hr at 07/01/14 1610    Principal Problem:   Hip fracture, right Active Problems:   Dementia   Fall   Right radial head fracture   HTN (hypertension)   Hip fracture   Leukocytosis, unspecified    Time spent: 40 minutes    Heron Pitcock M.D. [facility] Pager [phone]. If 7PM-7AM, please contact night-coverage at [email or web address], password [facility] 07/01/2014, 10:51 AM  LOS: 1 day

## 2014-07-01 NOTE — Progress Notes (Signed)
Dr Landry Dyke notified of heart rate that remains 114-117. No orders obtained.

## 2014-07-01 NOTE — Brief Op Note (Signed)
06/30/2014 - 07/01/2014  6:45 PM  PATIENT:  Bethany Fox  80 y.o. female  PRE-OPERATIVE DIAGNOSIS:  Right intertrochanteric femur fracture, hip fracture  POST-OPERATIVE DIAGNOSIS:  Right intertrochanteric femur fracture, hip fracture  PROCEDURE:  Procedure(s): OPEN REDUCTION INTERNAL FIXATION HIP (Right)  SURGEON:  Surgeon(s) and Role:    * Mauri Pole, MD - Primary  PHYSICIAN ASSISTANT: None  ANESTHESIA:   general  EBL:  Total I/O In: 682.5 [I.V.:682.5] Out: 300 [Urine:300]  BLOOD ADMINISTERED:none  DRAINS: none   LOCAL MEDICATIONS USED:  NONE  SPECIMEN:  No Specimen  DISPOSITION OF SPECIMEN:  N/A  COUNTS:  YES  TOURNIQUET:  * No tourniquets in log *  DICTATION: .Other Dictation: Dictation Number [id number]  PLAN OF CARE: Admit to inpatient   PATIENT DISPOSITION:  PACU - hemodynamically stable.   Delay start of Pharmacological VTE agent (>24hrs) due to surgical blood loss or risk of bleeding: no

## 2014-07-01 NOTE — H&P (View-Only) (Signed)
   Subjective: Right hip IT fracture   Patient resting comfortably in bed, apparently has been since she had ativan. Apparently very agitated throughout the night, pulling at her cast and trying to move from bed.  Objective:   VITALS:   Filed Vitals:   07/01/14 0647  BP: 151/60  Pulse: 92  Temp: 98.5 F (36.9 C)  Resp: 18    No cellulitis present Compartment soft  LABS  Recent Labs  06/30/14 2149 07/01/14 0420  HGB 12.5 11.7*  HCT 37.1 34.8*  WBC 12.2* 12.7*  PLT 162 151     Recent Labs  06/30/14 2149 07/01/14 0420  NA 138 139  K 3.6* 3.5*  BUN 16 16  CREATININE 0.65 0.58  GLUCOSE 127* 128*     Assessment/Plan: Right hip IT fracture  Discussed surgery with pt's daughters. Risks, benefits and expectations were discussed with the patient's daughters.  Risks including but not limited to the risk of anesthesia, blood clots, nerve damage, blood vessel damage, failure of the prosthesis, infection and up to and including death.  Also discussed the risks, benefits and expectations of not having the surgery.  Both daughter appear to be in agreement to proceed with surgery to give there mother pain management and best chance to not be confided to bed, which she doesn't do well with.  Already NPO Will obtain consent, will need to have POA confirmation Plan would be for IM nailing of the right hip per Dr. Alvan Dame today.      [place] Pugh Rolly Magri   PAC  07/01/2014, 10:25 AM

## 2014-07-01 NOTE — Anesthesia Preprocedure Evaluation (Addendum)
Anesthesia Evaluation  Patient identified by MRN, date of birth, ID band Patient awake    Reviewed: Allergy & Precautions, H&P , NPO status , Patient's Chart, lab work & pertinent test results  Airway Mallampati: II TM Distance: >3 FB Neck ROM: full    Dental no notable dental hx. (+) Teeth Intact, Dental Advisory Given   Pulmonary neg pulmonary ROS,  breath sounds clear to auscultation  Pulmonary exam normal       Cardiovascular Exercise Tolerance: Poor hypertension, Rhythm:regular Rate:Normal  Severe untreated htn. LAFB   Neuro/Psych dementia negative neurological ROS  negative psych ROS   GI/Hepatic negative GI ROS, Neg liver ROS,   Endo/Other  negative endocrine ROS  Renal/GU negative Renal ROS  negative genitourinary   Musculoskeletal   Abdominal   Peds  Hematology negative hematology ROS (+)   Anesthesia Other Findings   Reproductive/Obstetrics negative OB ROS                          Anesthesia Physical Anesthesia Plan  ASA: III  Anesthesia Plan: General   Post-op Pain Management:    Induction: Intravenous  Airway Management Planned: Oral ETT  Additional Equipment:   Intra-op Plan:   Post-operative Plan: Extubation in OR  Informed Consent: I have reviewed the patients History and Physical, chart, labs and discussed the procedure including the risks, benefits and alternatives for the proposed anesthesia with the patient or authorized representative who has indicated his/her understanding and acceptance.   Dental Advisory Given  Plan Discussed with: CRNA and Surgeon  Anesthesia Plan Comments:         Anesthesia Quick Evaluation

## 2014-07-01 NOTE — Anesthesia Postprocedure Evaluation (Signed)
  Anesthesia Post-op Note  Patient: Bethany Fox  Procedure(s) Performed: Procedure(s) (LRB): OPEN REDUCTION INTERNAL FIXATION HIP (Right)  Patient Location: PACU  Anesthesia Type: General  Level of Consciousness: awake and alert   Airway and Oxygen Therapy: Patient Spontanous Breathing  Post-op Pain: mild  Post-op Assessment: Post-op Vital signs reviewed, Patient's Cardiovascular Status Stable, Respiratory Function Stable, Patent Airway and No signs of Nausea or vomiting  Last Vitals:  Filed Vitals:   07/01/14 1805  BP: 173/60  Pulse:   Temp:   Resp:     Post-op Vital Signs: stable   Complications: No apparent anesthesia complications

## 2014-07-01 NOTE — Progress Notes (Signed)
Attempted insertion of foley catheter upon pts arrival to the unit. Pt too agitated and refusing insertion of foley. Re-attempted this morning but pt is resting very comfortably, and pts daughter does not want her woken up since they did not get very much sleep.

## 2014-07-02 ENCOUNTER — Inpatient Hospital Stay (HOSPITAL_COMMUNITY): Payer: Medicare Other

## 2014-07-02 DIAGNOSIS — J449 Chronic obstructive pulmonary disease, unspecified: Secondary | ICD-10-CM | POA: Diagnosis not present

## 2014-07-02 DIAGNOSIS — S52123A Displaced fracture of head of unspecified radius, initial encounter for closed fracture: Secondary | ICD-10-CM | POA: Diagnosis not present

## 2014-07-02 DIAGNOSIS — S72143A Displaced intertrochanteric fracture of unspecified femur, initial encounter for closed fracture: Secondary | ICD-10-CM | POA: Diagnosis not present

## 2014-07-02 DIAGNOSIS — F0391 Unspecified dementia with behavioral disturbance: Secondary | ICD-10-CM | POA: Diagnosis not present

## 2014-07-02 DIAGNOSIS — D72829 Elevated white blood cell count, unspecified: Secondary | ICD-10-CM | POA: Diagnosis not present

## 2014-07-02 DIAGNOSIS — E8770 Fluid overload, unspecified: Secondary | ICD-10-CM | POA: Clinically undetermined

## 2014-07-02 LAB — CBC WITH DIFFERENTIAL/PLATELET
BASOS PCT: 0 % (ref 0–1)
Basophils Absolute: 0 10*3/uL (ref 0.0–0.1)
Eosinophils Absolute: 0 10*3/uL (ref 0.0–0.7)
Eosinophils Relative: 0 % (ref 0–5)
HEMATOCRIT: 31 % — AB (ref 36.0–46.0)
HEMOGLOBIN: 10.4 g/dL — AB (ref 12.0–15.0)
Lymphocytes Relative: 7 % — ABNORMAL LOW (ref 12–46)
Lymphs Abs: 1 10*3/uL (ref 0.7–4.0)
MCH: 30.7 pg (ref 26.0–34.0)
MCHC: 33.5 g/dL (ref 30.0–36.0)
MCV: 91.4 fL (ref 78.0–100.0)
MONO ABS: 0.9 10*3/uL (ref 0.1–1.0)
Monocytes Relative: 7 % (ref 3–12)
NEUTROS ABS: 11.5 10*3/uL — AB (ref 1.7–7.7)
Neutrophils Relative %: 86 % — ABNORMAL HIGH (ref 43–77)
Platelets: 146 10*3/uL — ABNORMAL LOW (ref 150–400)
RBC: 3.39 MIL/uL — ABNORMAL LOW (ref 3.87–5.11)
RDW: 14.4 % (ref 11.5–15.5)
WBC: 13.4 10*3/uL — ABNORMAL HIGH (ref 4.0–10.5)

## 2014-07-02 LAB — BASIC METABOLIC PANEL
ANION GAP: 9 (ref 5–15)
BUN: 13 mg/dL (ref 6–23)
CALCIUM: 8.2 mg/dL — AB (ref 8.4–10.5)
CHLORIDE: 104 meq/L (ref 96–112)
CO2: 26 mEq/L (ref 19–32)
CREATININE: 0.54 mg/dL (ref 0.50–1.10)
GFR calc Af Amer: >90 mL/min (ref >90)
GFR calc non Af Amer: 78 mL/min — ABNORMAL LOW (ref >90)
Glucose, Bld: 142 mg/dL — ABNORMAL HIGH (ref 70–99)
Potassium: 3.7 mEq/L (ref 3.7–5.3)
Sodium: 139 mEq/L (ref 137–147)

## 2014-07-02 LAB — URINE CULTURE
COLONY COUNT: NO GROWTH
CULTURE: NO GROWTH

## 2014-07-02 LAB — PRO B NATRIURETIC PEPTIDE: Pro B Natriuretic peptide (BNP): 1610 pg/mL — ABNORMAL HIGH (ref 0–450)

## 2014-07-02 MED ORDER — SODIUM CHLORIDE 0.9 % IJ SOLN
3.0000 mL | Freq: Two times a day (BID) | INTRAMUSCULAR | Status: DC
  Administered 2014-07-02: 3 mL via INTRAVENOUS

## 2014-07-02 MED ORDER — FUROSEMIDE 10 MG/ML IJ SOLN
20.0000 mg | Freq: Two times a day (BID) | INTRAMUSCULAR | Status: DC
  Administered 2014-07-02: 20 mg via INTRAVENOUS
  Filled 2014-07-02: qty 2

## 2014-07-02 MED ORDER — HYDROCODONE-ACETAMINOPHEN 5-325 MG PO TABS: 1.0000 | ORAL_TABLET | Freq: Four times a day (QID) | ORAL | Status: DC | PRN

## 2014-07-02 MED ORDER — ASPIRIN 325 MG PO TBEC: 325.0000 mg | DELAYED_RELEASE_TABLET | Freq: Two times a day (BID) | ORAL | Status: AC

## 2014-07-02 MED ORDER — ASPIRIN EC 325 MG PO TBEC
325.0000 mg | DELAYED_RELEASE_TABLET | Freq: Two times a day (BID) | ORAL | Status: DC
  Administered 2014-07-02 – 2014-07-04 (×5): 325 mg via ORAL
  Filled 2014-07-02 (×7): qty 1

## 2014-07-02 NOTE — Progress Notes (Signed)
   Subjective: 1 Day Post-Op Procedure(s) (LRB): OPEN REDUCTION INTERNAL FIXATION HIP (Right)   Patient is resting comfortably in bed. There are no reports events throughout the night.   Objective:   VITALS:   Filed Vitals:   07/02/14 0502  BP: 127/62  Pulse: 90  Temp: 97.4 F (36.3 C)  Resp: 20    Incision: scant drainage No cellulitis present Compartment soft  LABS  Recent Labs  06/30/14 2149 07/01/14 0420 07/02/14 0455  HGB 12.5 11.7* 10.4*  HCT 37.1 34.8* 31.0*  WBC 12.2* 12.7* 13.4*  PLT 162 151 146*     Recent Labs  06/30/14 2149 07/01/14 0420 07/02/14 0455  NA 138 139 139  K 3.6* 3.5* 3.7  BUN 16 16 13   CREATININE 0.65 0.58 0.54  GLUCOSE 127* 128* 142*     Assessment/Plan: 1 Day Post-Op Procedure(s) (LRB): OPEN REDUCTION INTERNAL FIXATION HIP (Right) Up with therapy Discharge to SNF eventually upon d/c.  Ortho recommendations: ASA 325 mg bid for 4 weeks for anticoagulation, unless other medically indicated. Norco pain management (Rx written). MiraLax and Colace for constipation Iron 325 mg tid for 2-3 weeks  PWB 50% right leg Dressing to remain in place until follow in clinic in 2 weeks. Dressing is waterproof and may shower with it in place. Follow up in 2 weeks at [facility] Follow up with OLIN,Rockey Guarino D in 2 weeks.  Contact information:  [facility] [name]   PAC  07/02/2014, 7:26 AM

## 2014-07-02 NOTE — Progress Notes (Signed)
OT Cancellation Note  Patient Details Name: Bethany Fox MRN: [id number] DOB: 1917/05/07   Cancelled Treatment:    Reason Eval/Treat Not Completed: Patient not medically ready - pt lethargic and required +2 assistance to move to EOB only with PT.  Will check back tomorrow.  Darlina Rumpf [place], OTR/L [phone]  07/02/2014, 12:38 PM

## 2014-07-02 NOTE — Evaluation (Signed)
Physical Therapy Evaluation Patient Details Name: Bethany Fox MRN: [id number] DOB: Nov 02, 1917 Today's Date: 07/02/2014   History of Present Illness  R hip fx s/p ORIF; R radial head fx - R UE in sling  Clinical Impression  Pt s/p ORIF of R hip fx and with R radial head fx presents with decreased R LE strength/ROM, inability to use R UE (in sling), PWB status on R LE and current lethargic state with hx of dementia limiting functional mobility.  Pt will benefit from follow up rehab at SNF level.    Follow Up Recommendations SNF    Equipment Recommendations  None recommended by PT    Recommendations for Other Services OT consult     Precautions / Restrictions Precautions Precautions: Fall Required Braces or Orthoses: Sling Restrictions Weight Bearing Restrictions: Yes RUE Weight Bearing: Non weight bearing RLE Weight Bearing: Partial weight bearing RLE Partial Weight Bearing Percentage or Pounds: 50%      Mobility  Bed Mobility Overal bed mobility: +2 for physical assistance;Needs Assistance Bed Mobility: Supine to Sit;Sit to Supine     Supine to sit: +2 for physical assistance;Mod assist Sit to supine: Max assist;+2 for physical assistance   General bed mobility comments: Pt verbally agreeing to assist with bed mobility.  However no initiation of movement noted.  Pt assisted to EOB on draw sheet and able to balance in sitting with SBA only.  Max assist to return to supine  Transfers Overall transfer level:  (NT 2* pts current lethargy)                  Ambulation/Gait                Stairs            Wheelchair Mobility    Modified Rankin (Stroke Patients Only)       Balance                                             Pertinent Vitals/Pain No specific c/o or indications of pain during session.    Home Living                   Additional Comments: Pt unalbe to provide hx     Prior Function                  Hand Dominance        Extremity/Trunk Assessment   Upper Extremity Assessment: RUE deficits/detail RUE Deficits / Details: R UE in sling         Lower Extremity Assessment: RLE deficits/detail RLE Deficits / Details: Pt tolerating and assisting with movement at R hip up to 80 flex and 15 abd       Communication   Communication: Other (comment) (Lethargic and with hx of dementia - difficult to assess)  Cognition Arousal/Alertness: Lethargic Behavior During Therapy: Flat affect Overall Cognitive Status: History of cognitive impairments - at baseline                      General Comments      Exercises General Exercises - Lower Extremity Ankle Circles/Pumps: AAROM;Both;10 reps;Supine Heel Slides: AAROM;Right;5 reps;Supine Hip ABduction/ADduction: AAROM;Right;5 reps;Supine      Assessment/Plan    PT Assessment Patient needs continued PT services  PT Diagnosis Difficulty walking   PT  Problem List Decreased strength;Decreased range of motion;Decreased activity tolerance;Decreased balance;Decreased mobility;Decreased cognition;Decreased knowledge of use of DME;Decreased knowledge of precautions  PT Treatment Interventions DME instruction;Gait training;Functional mobility training;Therapeutic activities;Therapeutic exercise;Patient/family education   PT Goals (Current goals can be found in the Care Plan section) Acute Rehab PT Goals Patient Stated Goal: Pt unable to state goals. PT Goal Formulation: Patient unable to participate in goal setting Time For Goal Achievement: 07/09/14 Potential to Achieve Goals: Fair    Frequency Min 3X/week   Barriers to discharge        Co-evaluation               End of Session   Activity Tolerance: Patient limited by lethargy Patient left: in bed;with call bell/phone within reach Nurse Communication: Mobility status         Time: 4599-7741 PT Time Calculation (min): 20 min   Charges:   PT  Evaluation $Initial PT Evaluation Tier I: 1 Procedure PT Treatments $Therapeutic Activity: 8-22 mins   PT G Codes:          Ry Moody 07/02/2014, 12:25 PM

## 2014-07-02 NOTE — Progress Notes (Addendum)
Pt managed to remove R arm sling , ace wrap & soft cast. Reapplied splint with ace wrap & sling. Safety mitten applied on L hand to prevent pt from pulling R arm ortho/immobilizer. Will notify ortho tech to replace R arm soft cast.

## 2014-07-02 NOTE — Op Note (Signed)
NAMEJOELY, Fox NO.:  [id number]  MEDICAL RECORD NO.:  [id number]  LOCATION:  9983                         FACILITY:  [facility]  PHYSICIAN:  Pietro Cassis. Alvan Dame, M.D.  DATE OF BIRTH:  [birth year]  DATE OF PROCEDURE:  07/01/2014 DATE OF DISCHARGE:                              OPERATIVE REPORT   PREOPERATIVE DIAGNOSIS:  Right intertrochanteric femur fracture.  POSTOPERATIVE DIAGNOSIS:  Right intertrochanteric femur fracture.  PROCEDURE:  Open reduction and internal fixation of right intertrochanteric femur fracture utilizing a Biomet AFFIXUS nail 9 mm x 180 mm, 130 degree lag screw measured 95 mm.  No distal interlock based on the tight fit, in reduction and stability.  SURGEON:  Pietro Cassis. Alvan Dame, M.D.  ASSISTANT:  Surgical team.  ANESTHESIA:  General.  SPECIMENS:  None.  COMPLICATION:  None.  BLOOD LOSS:  Probably about 50 mL.  INDICATION FOR PROCEDURE:  Bethany Fox is a 80 year old female with multiple falls recently, unknown based on her history of dementia.  This was about her 6th fall, her family reports.  She unfortunately had the inability to bear weight after this fall and was brought to the emergency room.  She was noted to not only have a right intertrochanteric femur fracture, but also right radial head fracture which was splinted in the emergency room and will probably be treated non-operatively.  However, we reviewed with the family the complications related to hip fracture management without an operation, and the potential need for bed rest, pain with mobility, and the potential for indirect consequences of this including pneumonia and/or bedsores.  The family opted for pain control,  palliative measures and tried to maintain some functional quality of existence for her to proceed with surgical intervention. Risks of nonunion and malunion, need for future surgery were discussed. Risks of infection was also reviewed.  Family very much aware.   Consent was obtained.  PROCEDURE IN DETAIL:  Patient was brought to the operative theater. Once adequate anesthesia, preoperative antibiotics, Ancef administered, she was positioned supine on the fracture table.  Her right foot was placed in the traction boot.  Her left leg was placed in a well-leg holder with bony prominence padded particularly over the lateral peroneal nerve.  Traction and internal rotation was applied.  Fracture was reduced under fluoroscopic imaging.  At this point, the right hip was prepped and draped in a sterile fashion using shower curtain technique.  Time-out was performed identifying the patient, planned procedure, and extremity.  Fluoroscopy was brought back to the field, anatomy was identified, incision was then made proximal of the trochanter laterally.  Guidewire was then inserted into the tip of the trochanter.  The proximal femur was opened with a drill.  The nail was then passed by hand to its appropriate depth.  Using the insertion jig, the guidewire was inserted into the center of the femoral head in AP and lateral planes.  I measured the depth, selected 95 mm screw then started the drill for the screw.  The lag screw was then passed by hand.  I then took traction off the femur and applied the traction of the compression wheel and medialize the shaft of the fracture  to provide stability.  I tightened down the locking bolt and backed it off the corner of a turn to allow for further compression.  Based on the fit inside the femoral canal, I have decided to not place a distal interlock.  At this point, the final radiographs were obtained in AP and lateral planes.  I irrigated the wounds.  I then reapproximated the proximal wound in layers using #1 Vicryl in the gluteal fascia.  The remaining of the wound was closed with 2-0 Vicryl and 4-0 Monocryl.  The distal wound was closed with 2-0 Vicryl.  The wounds were then cleaned, dried, and dressed  sterilely using Dermabond and then Aquacel dressing.  She was then extubated and brought to the recovery room in stable condition, tolerating the procedure well.  Findings reviewed with family.     Pietro Cassis Alvan Dame, M.D.     MDO/MEDQ  D:  07/01/2014  T:  07/02/2014  Job:  [id number]

## 2014-07-02 NOTE — Progress Notes (Signed)
[facility] PROGRESS NOTE  Bethany Fox OJJ:[id number] DOB: 04-18-1917 DOA: 06/30/2014 PCP: Jenna Luo TOM, MD  Assessment/Plan: #1 right intertrochanteric femur fracture Secondary to mechanical fall. Status post ORIF per Dr. Alvan Dame 07/01/2014. Per orthopedics.  #2 right radial head fracture Per orthopedics to be managed conservatively with immobilization only. Per orthopedics.  #3 leukocytosis Likely reactive leukocytosis. Chest x-ray is negative. Urinalysis is negative. Follow.   #4 dementia  #5 falls Fall precautions. PT/OT.  #6 hypertension Stable. Follow.  Code Status: DO NOT RESUSCITATE Family Communication: The patient no family at bedside. Disposition Plan: Probable skilled nursing facility when medically stable.   Consultants:  Orthopedics: Dr. Rolena Infante 07/01/2014  Procedures:  CT head/CT C-spine 06/30/2014  X-ray of the right femur 06/30/2014  X-ray of the right forearm 06/30/2014  X-ray of the pelvis 06/30/2014  Chest x-ray 06/30/2014, 07/02/2014  ORIF right intertrochanteric femur fracture 07/01/2014 per Dr. Alvan Dame    Antibiotics:  None  HPI/Subjective: Patient sleeping however easily arousable. No complaints.  Objective: Filed Vitals:   07/02/14 0502  BP: 127/62  Pulse: 90  Temp: 97.4 F (36.3 C)  Resp: 20    Intake/Output Summary (Last 24 hours) at 07/02/14 1016 Last data filed at 07/02/14 0900  Gross per 24 hour  Intake   3085 ml  Output    645 ml  Net   2440 ml   Filed Weights   07/01/14 0700  Weight: 60.6 kg (133 lb 9.6 oz)    Exam:   General:  NAD  Cardiovascular: Regular rate rhythm no murmurs rubs or gallops  Respiratory: Clear to auscultation bilaterally in anterior lung fields  Abdomen: Soft, nontender, nondistended, positive bowel sounds  Musculoskeletal: No clubbing cyanosis. Right upper extremity in the immobilizer. Right lower extremity in Buck's traction.  Data Reviewed: Basic Metabolic  Panel:  Recent Labs Lab 06/30/14 2149 07/01/14 0420 07/02/14 0455  NA 138 139 139  K 3.6* 3.5* 3.7  CL 99 102 104  CO2 26 24 26   GLUCOSE 127* 128* 142*  BUN 16 16 13   CREATININE 0.65 0.58 0.54  CALCIUM 9.0 8.9 8.2*  MG  --  1.9  --    Liver Function Tests:  Recent Labs Lab 06/30/14 2149  AST 19  ALT 11  ALKPHOS 124*  BILITOT 0.5  PROT 7.3  ALBUMIN 3.7   No results found for this basename: LIPASE, AMYLASE,  in the last 168 hours No results found for this basename: AMMONIA,  in the last 168 hours CBC:  Recent Labs Lab 06/30/14 2149 07/01/14 0420 07/02/14 0455  WBC 12.2* 12.7* 13.4*  NEUTROABS 10.4* 10.4* 11.5*  HGB 12.5 11.7* 10.4*  HCT 37.1 34.8* 31.0*  MCV 90.5 90.9 91.4  PLT 162 151 146*   Cardiac Enzymes: No results found for this basename: CKTOTAL, CKMB, CKMBINDEX, TROPONINI,  in the last 168 hours BNP (last 3 results)  Recent Labs  07/02/14 0455  PROBNP 1610.0*   CBG: No results found for this basename: GLUCAP,  in the last 168 hours  No results found for this or any previous visit (from the past 240 hour(s)).   Studies: Dg Chest 2 View  06/30/2014   CLINICAL DATA:  Fall, right forearm pain.  EXAM: CHEST  2 VIEW  COMPARISON:  Chest radiograph April 18, 2014  FINDINGS: Cardiac silhouette is upper limits of normal in size, unchanged. Mediastinal silhouette is nonsuspicious, mildly calcified aortic knob. Similar chronic interstitial changes without pleural effusions or focal consolidations. No pneumothorax, biapical pleural  thickening.  Osteopenia.  Surgical clips project in left axilla.  IMPRESSION: Borderline cardiomegaly and chronic interstitial changes.   Electronically Signed   By: Elon Alas   On: 06/30/2014 21:01   Dg Pelvis 1-2 Views  06/30/2014   CLINICAL DATA:  Fall  EXAM: PELVIS - 1-2 VIEW  COMPARISON:  None.  FINDINGS: Nondisplaced right intertrochanteric fracture. No dislocation. Moderate osteoarthritis of the right hip.  No left hip  fracture. Generalized osteopenia. Mild degenerative changes of bilateral SI joints and lower lumbar spine.  IMPRESSION: Nondisplaced right intertrochanteric fracture without dislocation.   Electronically Signed   By: Kathreen Devoid   On: 06/30/2014 21:00   Dg Forearm Right  06/30/2014   CLINICAL DATA:  Fall  EXAM: RIGHT FOREARM - 2 VIEW  COMPARISON:  None.  FINDINGS: Displaced right radial head fracture with the volar fragment rotated and displaced anteriorly.  There is no other fracture or dislocation.  There is generalized osteopenia. There are degenerative changes of the triscaphe joint.  IMPRESSION: Displaced right radial head fracture.   Electronically Signed   By: Kathreen Devoid   On: 06/30/2014 21:02   Dg Hip Operative Right  07/01/2014   CLINICAL DATA:  Right hip IM nail  EXAM: DG OPERATIVE RIGHT HIP  TECHNIQUE: A single spot fluoroscopic AP image of the right hip is submitted.  COMPARISON:  Right femur radiographs dated 06/30/2014  FLUOROSCOPY TIME:  38 seconds.  FINDINGS: Intraoperative fluoroscopic images during IM nail and dynamic hip screw fixation of an intertrochanteric right hip fracture.  Fracture fragments are in near-anatomic alignment and position.  IMPRESSION: Intraoperative fluoroscopic radiographs, as above.   Electronically Signed   By: Julian Hy M.D.   On: 07/01/2014 19:47   Dg Femur Right  06/30/2014   CLINICAL DATA:  Fall.  Right femur pain.  EXAM: RIGHT FEMUR - 2 VIEW  COMPARISON:  None.  FINDINGS: A mildly displaced intertrochanteric fracture is present. Chronic degenerative changes are noted the hip with significant loss of joint space. Mild degenerative changes are noted at the knee. No additional fractures are evident.  IMPRESSION: 1. Intertrochanteric fracture of the right femur. 2. Chronic degenerative changes of the hip and knee.   Electronically Signed   By: Lawrence Santiago M.D.   On: 06/30/2014 21:04   Ct Head Wo Contrast  06/30/2014   CLINICAL DATA:  Status post fall   EXAM: CT HEAD WITHOUT CONTRAST  CT CERVICAL SPINE WITHOUT CONTRAST  TECHNIQUE: Multidetector CT imaging of the head and cervical spine was performed following the standard protocol without intravenous contrast. Multiplanar CT image reconstructions of the cervical spine were also generated.  COMPARISON:  04/18/2014 head CT  FINDINGS: CT HEAD FINDINGS  Skull and Sinuses:Negative for fracture or destructive process. The mastoids, middle ears, and imaged paranasal sinuses are clear.  Orbits: Bilateral cataract resection  Brain: No evidence of acute abnormality, such as acute infarction, hemorrhage, hydrocephalus, or mass lesion/mass effect. There is generalized cerebral volume loss which is age commensurate. Mild chronic small vessel disease, best seen around the lateral ventricles, also expected for age.  CT CERVICAL SPINE FINDINGS  There is no evidence of acute fracture or traumatic subluxation. Diffuse degenerative disc narrowing and facet osteoarthritis. There is no notable osseous canal or foraminal stenosis. C7-T1 posterior element ankylosis is noted. No prevertebral swelling or gross cervical canal hematoma. There is reticular scarring in the right more than left apices, with bronchiectasis noted on the right.  IMPRESSION: 1. No evidence of  acute intracranial or cervical spine injury. 2. Intracranial and cervical spine senescent changes are noted above. 3. Postinfectious biapical lung scarring with bronchiectasis.   Electronically Signed   By: Jorje Guild M.D.   On: 06/30/2014 21:48   Ct Cervical Spine Wo Contrast  06/30/2014   CLINICAL DATA:  Status post fall  EXAM: CT HEAD WITHOUT CONTRAST  CT CERVICAL SPINE WITHOUT CONTRAST  TECHNIQUE: Multidetector CT imaging of the head and cervical spine was performed following the standard protocol without intravenous contrast. Multiplanar CT image reconstructions of the cervical spine were also generated.  COMPARISON:  04/18/2014 head CT  FINDINGS: CT HEAD FINDINGS   Skull and Sinuses:Negative for fracture or destructive process. The mastoids, middle ears, and imaged paranasal sinuses are clear.  Orbits: Bilateral cataract resection  Brain: No evidence of acute abnormality, such as acute infarction, hemorrhage, hydrocephalus, or mass lesion/mass effect. There is generalized cerebral volume loss which is age commensurate. Mild chronic small vessel disease, best seen around the lateral ventricles, also expected for age.  CT CERVICAL SPINE FINDINGS  There is no evidence of acute fracture or traumatic subluxation. Diffuse degenerative disc narrowing and facet osteoarthritis. There is no notable osseous canal or foraminal stenosis. C7-T1 posterior element ankylosis is noted. No prevertebral swelling or gross cervical canal hematoma. There is reticular scarring in the right more than left apices, with bronchiectasis noted on the right.  IMPRESSION: 1. No evidence of acute intracranial or cervical spine injury. 2. Intracranial and cervical spine senescent changes are noted above. 3. Postinfectious biapical lung scarring with bronchiectasis.   Electronically Signed   By: Jorje Guild M.D.   On: 06/30/2014 21:48   Dg Chest Port 1 View  07/02/2014   CLINICAL DATA:  Chest pain.  Shortness of breath.  EXAM: PORTABLE CHEST - 1 VIEW  COMPARISON:  06/30/2014.  FINDINGS: The cardiac silhouette is mildly enlarged. The lungs remain clear, hyperexpanded and with prominent interstitial markings. Stable left axillary surgical clips and postmastectomy changes on the left. Diffuse osteopenia.  IMPRESSION: 1. No acute abnormality. 2. COPD. 3. Mild cardiomegaly.   Electronically Signed   By: Enrique Sack M.D.   On: 07/02/2014 08:15    Scheduled Meds: . amLODipine  5 mg Oral Daily  . aspirin EC  325 mg Oral BID  . docusate sodium  100 mg Oral BID  . furosemide  20 mg Intravenous BID  . polyethylene glycol  17 g Oral Daily  . potassium chloride  40 mEq Oral Once  . risperiDONE  0.25 mg Oral  QHS  . venlafaxine XR  75 mg Oral Q breakfast   Continuous Infusions: . sodium chloride 0.9 % 1,000 mL infusion 75 mL/hr at 07/01/14 0839  . sodium chloride 0.9 % 1,000 mL with potassium chloride 10 mEq infusion 50 mL/hr at 07/01/14 2258    Principal Problem:   Hip fracture, right Active Problems:   Dementia   Fall   Right radial head fracture   HTN (hypertension)   Hip fracture   Leukocytosis, unspecified   Volume overload    Time spent: 78 minutes    THOMPSON,DANIEL M.D. [facility] Pager [phone]. If 7PM-7AM, please contact night-coverage at [email or web address], password [facility] 07/02/2014, 10:16 AM  LOS: 2 days

## 2014-07-03 DIAGNOSIS — D62 Acute posthemorrhagic anemia: Secondary | ICD-10-CM | POA: Diagnosis not present

## 2014-07-03 DIAGNOSIS — S72143A Displaced intertrochanteric fracture of unspecified femur, initial encounter for closed fracture: Secondary | ICD-10-CM | POA: Diagnosis not present

## 2014-07-03 DIAGNOSIS — N289 Disorder of kidney and ureter, unspecified: Secondary | ICD-10-CM | POA: Diagnosis not present

## 2014-07-03 DIAGNOSIS — S52123A Displaced fracture of head of unspecified radius, initial encounter for closed fracture: Secondary | ICD-10-CM | POA: Diagnosis not present

## 2014-07-03 LAB — BASIC METABOLIC PANEL
Anion gap: 12 (ref 5–15)
BUN: 27 mg/dL — AB (ref 6–23)
CO2: 25 meq/L (ref 19–32)
CREATININE: 1.16 mg/dL — AB (ref 0.50–1.10)
Calcium: 8 mg/dL — ABNORMAL LOW (ref 8.4–10.5)
Chloride: 101 mEq/L (ref 96–112)
GFR calc Af Amer: 45 mL/min — ABNORMAL LOW (ref >90)
GFR calc non Af Amer: 39 mL/min — ABNORMAL LOW (ref >90)
Glucose, Bld: 114 mg/dL — ABNORMAL HIGH (ref 70–99)
Potassium: 3.5 mEq/L — ABNORMAL LOW (ref 3.7–5.3)
Sodium: 138 mEq/L (ref 137–147)

## 2014-07-03 LAB — CBC
HCT: 25.3 % — ABNORMAL LOW (ref 36.0–46.0)
Hemoglobin: 8.5 g/dL — ABNORMAL LOW (ref 12.0–15.0)
MCH: 30.9 pg (ref 26.0–34.0)
MCHC: 33.6 g/dL (ref 30.0–36.0)
MCV: 92 fL (ref 78.0–100.0)
Platelets: 132 10*3/uL — ABNORMAL LOW (ref 150–400)
RBC: 2.75 MIL/uL — AB (ref 3.87–5.11)
RDW: 14.8 % (ref 11.5–15.5)
WBC: 10.2 10*3/uL (ref 4.0–10.5)

## 2014-07-03 LAB — IRON AND TIBC
Iron: 23 ug/dL — ABNORMAL LOW (ref 42–135)
Saturation Ratios: 12 % — ABNORMAL LOW (ref 20–55)
TIBC: 190 ug/dL — AB (ref 250–470)
UIBC: 167 ug/dL (ref 125–400)

## 2014-07-03 LAB — FERRITIN: FERRITIN: 130 ng/mL (ref 10–291)

## 2014-07-03 LAB — VITAMIN B12: VITAMIN B 12: 175 pg/mL — AB (ref 211–911)

## 2014-07-03 LAB — FOLATE: Folate: 13.7 ng/mL

## 2014-07-03 MED ORDER — SODIUM CHLORIDE 0.9 % IV SOLN
INTRAVENOUS | Status: AC
  Administered 2014-07-03 (×2): via INTRAVENOUS

## 2014-07-03 MED ORDER — SENNOSIDES-DOCUSATE SODIUM 8.6-50 MG PO TABS: 1.0000 | ORAL_TABLET | Freq: Every evening | ORAL | Status: DC | PRN

## 2014-07-03 NOTE — Progress Notes (Signed)
Subjective: 2 Days Post-Op Procedure(s) (LRB): OPEN REDUCTION INTERNAL FIXATION HIP (Right) Patient reports pain as mild to right hip. Resting well in bed. No complaints or complications.   Objective: Vital signs in last 24 hours: Temp:  [97.8 F (36.6 C)-99.8 F (37.7 C)] 97.8 F (36.6 C) (07/05 0442) Pulse Rate:  [89-101] 89 (07/05 0442) Resp:  [18-20] 20 (07/05 0442) BP: (94-122)/(44-46) 99/44 mmHg (07/05 0442) SpO2:  [92 %-100 %] 100 % (07/05 0442) Weight:  [62.8 kg (138 lb 7.2 oz)] 62.8 kg (138 lb 7.2 oz) (07/05 0442)  Intake/Output from previous day: 07/04 0701 - 07/05 0700 In: 755.5 [P.O.:300; I.V.:405.5; IV Piggyback:50] Out: 425 [Urine:425] Intake/Output this shift:     Recent Labs  06/30/14 2149 07/01/14 0420 07/02/14 0455 07/03/14 0421  HGB 12.5 11.7* 10.4* 8.5*    Recent Labs  07/02/14 0455 07/03/14 0421  WBC 13.4* 10.2  RBC 3.39* 2.75*  HCT 31.0* 25.3*  PLT 146* 132*    Recent Labs  07/02/14 0455 07/03/14 0421  NA 139 138  K 3.7 3.5*  CL 104 101  CO2 26 25  BUN 13 27*  CREATININE 0.54 1.16*  GLUCOSE 142* 114*  CALCIUM 8.2* 8.0*    Recent Labs  06/30/14 2149 07/01/14 0420  INR 1.06 1.11    Awakens appropriate to stimuli. Right hip dressing C/D/I. RLE N/V intact. No signs of infection or compartment issues.   Assessment/Plan: 2 Days Post-Op Procedure(s) (LRB): OPEN REDUCTION INTERNAL FIXATION HIP (Right) SNF at D/c Up with PT Continue current care Leave dressing in place   Acute kidney injury: Monitor labs Hydrate Follow medicine recommendations  Bethany Fox 07/03/2014, 7:34 AM

## 2014-07-03 NOTE — Progress Notes (Signed)
[facility] PROGRESS NOTE  Bethany Fox ZGY:[id number] DOB: 16-Apr-1917 DOA: 06/30/2014 PCP: Jenna Luo TOM, MD  Assessment/Plan: #1 right intertrochanteric femur fracture Secondary to mechanical fall. Status post ORIF per Dr. Alvan Dame 07/01/2014. Per orthopedics.  #2 right radial head fracture Per orthopedics to be managed conservatively with immobilization only. Per orthopedics.  #3 leukocytosis Likely reactive leukocytosis. Chest x-ray is negative. Urinalysis is negative. WBC trending down. Follow.   #4 dementia  #5 falls Fall precautions. PT/OT.  #6 hypertension Stable. Follow.  #7 acute renal insufficiency Likely secondary to prerenal azotemia. Patient received a dose of IV Lasix yesterday. Will hydrate gently with IV fluids. Follow.  #8 post op acute blood loss anemia Patient with no overt GI bleed. Follow H&H. Transfusion threshold hemoglobin less than 7.  Code Status: DO NOT RESUSCITATE Family Communication: The patient no family at bedside. Disposition Plan: Back to dementia unit with a skilled nursing per family when medically stable.   Consultants:  Orthopedics: Dr. Rolena Infante 07/01/2014  Procedures:  CT head/CT C-spine 06/30/2014  X-ray of the right femur 06/30/2014  X-ray of the right forearm 06/30/2014  X-ray of the pelvis 06/30/2014  Chest x-ray 06/30/2014, 07/02/2014  ORIF right intertrochanteric femur fracture 07/01/2014 per Dr. Alvan Dame    Antibiotics:  None  HPI/Subjective: No complaints. Patient denies any GI bleed.  Objective: Filed Vitals:   07/03/14 0442  BP: 99/44  Pulse: 89  Temp: 97.8 F (36.6 C)  Resp: 20    Intake/Output Summary (Last 24 hours) at 07/03/14 1106 Last data filed at 07/03/14 5916  Gross per 24 hour  Intake    303 ml  Output    225 ml  Net     78 ml   Filed Weights   07/01/14 0700 07/03/14 0442  Weight: 60.6 kg (133 lb 9.6 oz) 62.8 kg (138 lb 7.2 oz)    Exam:   General:  NAD  Cardiovascular:  Regular rate rhythm no murmurs rubs or gallops  Respiratory: Clear to auscultation bilaterally in anterior lung fields  Abdomen: Soft, nontender, nondistended, positive bowel sounds  Musculoskeletal: No clubbing cyanosis. Right upper extremity in the immobilizer.   Data Reviewed: Basic Metabolic Panel:  Recent Labs Lab 06/30/14 2149 07/01/14 0420 07/02/14 0455 07/03/14 0421  NA 138 139 139 138  K 3.6* 3.5* 3.7 3.5*  CL 99 102 104 101  CO2 26 24 26 25   GLUCOSE 127* 128* 142* 114*  BUN 16 16 13  27*  CREATININE 0.65 0.58 0.54 1.16*  CALCIUM 9.0 8.9 8.2* 8.0*  MG  --  1.9  --   --    Liver Function Tests:  Recent Labs Lab 06/30/14 2149  AST 19  ALT 11  ALKPHOS 124*  BILITOT 0.5  PROT 7.3  ALBUMIN 3.7   No results found for this basename: LIPASE, AMYLASE,  in the last 168 hours No results found for this basename: AMMONIA,  in the last 168 hours CBC:  Recent Labs Lab 06/30/14 2149 07/01/14 0420 07/02/14 0455 07/03/14 0421  WBC 12.2* 12.7* 13.4* 10.2  NEUTROABS 10.4* 10.4* 11.5*  --   HGB 12.5 11.7* 10.4* 8.5*  HCT 37.1 34.8* 31.0* 25.3*  MCV 90.5 90.9 91.4 92.0  PLT 162 151 146* 132*   Cardiac Enzymes: No results found for this basename: CKTOTAL, CKMB, CKMBINDEX, TROPONINI,  in the last 168 hours BNP (last 3 results)  Recent Labs  07/02/14 0455  PROBNP 1610.0*   CBG: No results found for this basename: GLUCAP,  in  the last 168 hours  Recent Results (from the past 240 hour(s))  URINE CULTURE     Status: None   Collection Time    07/23/14 10:25 AM      Result Value Ref Range Status   Specimen Description URINE, CATHETERIZED   Final   Special Requests NONE   Final   Culture  Setup Time     Final   Value: July 23, 2014 14:59     Performed at [facility]     Final   Value: NO GROWTH     Performed at [facility]   Culture     Final   Value: NO GROWTH     Performed at [facility]   Report Status 07/02/2014  FINAL   Final     Studies: Dg Hip Operative Right  23-Jul-2014   CLINICAL DATA:  Right hip IM nail  EXAM: DG OPERATIVE RIGHT HIP  TECHNIQUE: A single spot fluoroscopic AP image of the right hip is submitted.  COMPARISON:  Right femur radiographs dated 06/30/2014  FLUOROSCOPY TIME:  38 seconds.  FINDINGS: Intraoperative fluoroscopic images during IM nail and dynamic hip screw fixation of an intertrochanteric right hip fracture.  Fracture fragments are in near-anatomic alignment and position.  IMPRESSION: Intraoperative fluoroscopic radiographs, as above.   Electronically Signed   By: Julian Hy M.D.   On: 2014-07-23 19:47   Dg Chest Port 1 View  07/02/2014   CLINICAL DATA:  Chest pain.  Shortness of breath.  EXAM: PORTABLE CHEST - 1 VIEW  COMPARISON:  06/30/2014.  FINDINGS: The cardiac silhouette is mildly enlarged. The lungs remain clear, hyperexpanded and with prominent interstitial markings. Stable left axillary surgical clips and postmastectomy changes on the left. Diffuse osteopenia.  IMPRESSION: 1. No acute abnormality. 2. COPD. 3. Mild cardiomegaly.   Electronically Signed   By: Enrique Sack M.D.   On: 07/02/2014 08:15    Scheduled Meds: . amLODipine  5 mg Oral Daily  . aspirin EC  325 mg Oral BID  . docusate sodium  100 mg Oral BID  . polyethylene glycol  17 g Oral Daily  . potassium chloride  40 mEq Oral Once  . risperiDONE  0.25 mg Oral QHS  . sodium chloride  3 mL Intravenous Q12H  . venlafaxine XR  75 mg Oral Q breakfast   Continuous Infusions: . sodium chloride 75 mL/hr at 07/03/14 0744  . sodium chloride 0.9 % 1,000 mL infusion Stopped (07/02/14 2100)  . sodium chloride 0.9 % 1,000 mL with potassium chloride 10 mEq infusion Stopped (07/02/14 1900)    Principal Problem:   Hip fracture, right Active Problems:   Dementia   Fall   Right radial head fracture   HTN (hypertension)   Hip fracture   Leukocytosis, unspecified   Postoperative anemia due to acute blood loss    Acute renal insufficiency    Time spent: 51 minutes    THOMPSON,DANIEL M.D. [facility] Pager [phone]. If 7PM-7AM, please contact night-coverage at [email or web address], password [facility] 07/03/2014, 11:06 AM  LOS: 3 days

## 2014-07-04 ENCOUNTER — Encounter (HOSPITAL_COMMUNITY): Payer: Self-pay | Admitting: Orthopedic Surgery

## 2014-07-04 DIAGNOSIS — S72009A Fracture of unspecified part of neck of unspecified femur, initial encounter for closed fracture: Secondary | ICD-10-CM | POA: Diagnosis not present

## 2014-07-04 DIAGNOSIS — R269 Unspecified abnormalities of gait and mobility: Secondary | ICD-10-CM | POA: Diagnosis not present

## 2014-07-04 DIAGNOSIS — D72829 Elevated white blood cell count, unspecified: Secondary | ICD-10-CM | POA: Diagnosis not present

## 2014-07-04 DIAGNOSIS — E785 Hyperlipidemia, unspecified: Secondary | ICD-10-CM | POA: Diagnosis not present

## 2014-07-04 DIAGNOSIS — T8131XA Disruption of external operation (surgical) wound, not elsewhere classified, initial encounter: Secondary | ICD-10-CM | POA: Diagnosis not present

## 2014-07-04 DIAGNOSIS — T819XXA Unspecified complication of procedure, initial encounter: Secondary | ICD-10-CM | POA: Diagnosis not present

## 2014-07-04 DIAGNOSIS — D500 Iron deficiency anemia secondary to blood loss (chronic): Secondary | ICD-10-CM | POA: Diagnosis not present

## 2014-07-04 DIAGNOSIS — N390 Urinary tract infection, site not specified: Secondary | ICD-10-CM | POA: Diagnosis not present

## 2014-07-04 DIAGNOSIS — S72009D Fracture of unspecified part of neck of unspecified femur, subsequent encounter for closed fracture with routine healing: Secondary | ICD-10-CM | POA: Diagnosis not present

## 2014-07-04 DIAGNOSIS — D509 Iron deficiency anemia, unspecified: Secondary | ICD-10-CM | POA: Diagnosis not present

## 2014-07-04 DIAGNOSIS — Z7982 Long term (current) use of aspirin: Secondary | ICD-10-CM | POA: Diagnosis not present

## 2014-07-04 DIAGNOSIS — R238 Other skin changes: Secondary | ICD-10-CM | POA: Diagnosis not present

## 2014-07-04 DIAGNOSIS — S72143A Displaced intertrochanteric fracture of unspecified femur, initial encounter for closed fracture: Secondary | ICD-10-CM | POA: Diagnosis not present

## 2014-07-04 DIAGNOSIS — I10 Essential (primary) hypertension: Secondary | ICD-10-CM | POA: Diagnosis not present

## 2014-07-04 DIAGNOSIS — F0390 Unspecified dementia without behavioral disturbance: Secondary | ICD-10-CM | POA: Diagnosis not present

## 2014-07-04 DIAGNOSIS — M6281 Muscle weakness (generalized): Secondary | ICD-10-CM | POA: Diagnosis not present

## 2014-07-04 DIAGNOSIS — S79929A Unspecified injury of unspecified thigh, initial encounter: Secondary | ICD-10-CM | POA: Diagnosis not present

## 2014-07-04 DIAGNOSIS — Z853 Personal history of malignant neoplasm of breast: Secondary | ICD-10-CM | POA: Diagnosis not present

## 2014-07-04 DIAGNOSIS — N171 Acute kidney failure with acute cortical necrosis: Secondary | ICD-10-CM | POA: Diagnosis not present

## 2014-07-04 DIAGNOSIS — N289 Disorder of kidney and ureter, unspecified: Secondary | ICD-10-CM | POA: Diagnosis not present

## 2014-07-04 DIAGNOSIS — E559 Vitamin D deficiency, unspecified: Secondary | ICD-10-CM | POA: Diagnosis not present

## 2014-07-04 DIAGNOSIS — R609 Edema, unspecified: Secondary | ICD-10-CM | POA: Diagnosis not present

## 2014-07-04 DIAGNOSIS — D62 Acute posthemorrhagic anemia: Secondary | ICD-10-CM | POA: Diagnosis not present

## 2014-07-04 DIAGNOSIS — Z8781 Personal history of (healed) traumatic fracture: Secondary | ICD-10-CM | POA: Diagnosis not present

## 2014-07-04 DIAGNOSIS — Z79899 Other long term (current) drug therapy: Secondary | ICD-10-CM | POA: Diagnosis not present

## 2014-07-04 DIAGNOSIS — Z9181 History of falling: Secondary | ICD-10-CM | POA: Diagnosis not present

## 2014-07-04 DIAGNOSIS — S79919A Unspecified injury of unspecified hip, initial encounter: Secondary | ICD-10-CM | POA: Diagnosis not present

## 2014-07-04 DIAGNOSIS — E039 Hypothyroidism, unspecified: Secondary | ICD-10-CM | POA: Diagnosis not present

## 2014-07-04 DIAGNOSIS — S52123A Displaced fracture of head of unspecified radius, initial encounter for closed fracture: Secondary | ICD-10-CM | POA: Diagnosis not present

## 2014-07-04 DIAGNOSIS — D649 Anemia, unspecified: Secondary | ICD-10-CM | POA: Diagnosis not present

## 2014-07-04 DIAGNOSIS — Z8719 Personal history of other diseases of the digestive system: Secondary | ICD-10-CM | POA: Diagnosis not present

## 2014-07-04 DIAGNOSIS — M25559 Pain in unspecified hip: Secondary | ICD-10-CM | POA: Diagnosis not present

## 2014-07-04 DIAGNOSIS — S5290XD Unspecified fracture of unspecified forearm, subsequent encounter for closed fracture with routine healing: Secondary | ICD-10-CM | POA: Diagnosis not present

## 2014-07-04 DIAGNOSIS — Z4789 Encounter for other orthopedic aftercare: Secondary | ICD-10-CM | POA: Diagnosis not present

## 2014-07-04 DIAGNOSIS — F329 Major depressive disorder, single episode, unspecified: Secondary | ICD-10-CM | POA: Diagnosis not present

## 2014-07-04 DIAGNOSIS — F3289 Other specified depressive episodes: Secondary | ICD-10-CM | POA: Diagnosis not present

## 2014-07-04 DIAGNOSIS — R58 Hemorrhage, not elsewhere classified: Secondary | ICD-10-CM | POA: Diagnosis not present

## 2014-07-04 DIAGNOSIS — Y838 Other surgical procedures as the cause of abnormal reaction of the patient, or of later complication, without mention of misadventure at the time of the procedure: Secondary | ICD-10-CM | POA: Diagnosis not present

## 2014-07-04 DIAGNOSIS — IMO0002 Reserved for concepts with insufficient information to code with codable children: Secondary | ICD-10-CM | POA: Diagnosis not present

## 2014-07-04 LAB — BASIC METABOLIC PANEL
Anion gap: 9 (ref 5–15)
BUN: 16 mg/dL (ref 6–23)
CALCIUM: 7.7 mg/dL — AB (ref 8.4–10.5)
CO2: 25 mEq/L (ref 19–32)
Chloride: 105 mEq/L (ref 96–112)
Creatinine, Ser: 0.65 mg/dL (ref 0.50–1.10)
GFR calc Af Amer: 85 mL/min — ABNORMAL LOW (ref >90)
GFR, EST NON AFRICAN AMERICAN: 73 mL/min — AB (ref >90)
GLUCOSE: 110 mg/dL — AB (ref 70–99)
Potassium: 3.4 mEq/L — ABNORMAL LOW (ref 3.7–5.3)
Sodium: 139 mEq/L (ref 137–147)

## 2014-07-04 LAB — CBC
HCT: 24.1 % — ABNORMAL LOW (ref 36.0–46.0)
Hemoglobin: 8.3 g/dL — ABNORMAL LOW (ref 12.0–15.0)
MCH: 31.4 pg (ref 26.0–34.0)
MCHC: 34.4 g/dL (ref 30.0–36.0)
MCV: 91.3 fL (ref 78.0–100.0)
Platelets: 127 10*3/uL — ABNORMAL LOW (ref 150–400)
RBC: 2.64 MIL/uL — ABNORMAL LOW (ref 3.87–5.11)
RDW: 14.5 % (ref 11.5–15.5)
WBC: 6.8 10*3/uL (ref 4.0–10.5)

## 2014-07-04 MED ORDER — LORAZEPAM 0.5 MG PO TABS: 0.2500 mg | ORAL_TABLET | Freq: Three times a day (TID) | ORAL | Status: DC | PRN

## 2014-07-04 MED ORDER — LORAZEPAM 2 MG/ML IJ SOLN: 0.2500 mg | Freq: Three times a day (TID) | INTRAMUSCULAR | Status: DC | PRN

## 2014-07-04 MED ORDER — SODIUM CHLORIDE 0.9 % IV SOLN: INTRAVENOUS | Status: DC

## 2014-07-04 MED ORDER — POTASSIUM CHLORIDE CRYS ER 20 MEQ PO TBCR
40.0000 meq | EXTENDED_RELEASE_TABLET | Freq: Once | ORAL | Status: AC
  Administered 2014-07-04: 40 meq via ORAL
  Filled 2014-07-04: qty 2

## 2014-07-04 MED ORDER — AMLODIPINE BESYLATE 5 MG PO TABS: 5.0000 mg | ORAL_TABLET | Freq: Every day | ORAL | Status: DC

## 2014-07-04 NOTE — Plan of Care (Signed)
Problem: Consults Goal: Hip/Femur Fracture Patient Education See Patient Education Module for education specifics.  Outcome: Completed/Met Date Met:  07/04/14 Family educated,pt has dementia

## 2014-07-04 NOTE — Discharge Summary (Signed)
Physician Discharge Summary  Bethany Fox ZOX:[id number] DOB: 08-18-1917 DOA: 06/30/2014  PCP: Odette Fraction, MD  Admit date: 06/30/2014 Discharge date: 07/04/2014  Time spent: 65 minutes  Recommendations for Outpatient Follow-up:  1. Patient is to be discharged to [facility] skilled nursing facility. Followup with Dr. Alvan Dame of orthopedics in 2 weeks. 2. Followup with M.D. at the skilled nursing facility. Patient will need a basic metabolic profile and a CBC done one week post discharge for followup on electrolytes, renal function and hemoglobin.  Discharge Diagnoses:  Principal Problem:   Hip fracture, right Active Problems:   Right radial head fracture   Postoperative anemia due to acute blood loss   Dementia   Fall   HTN (hypertension)   Hip fracture   Leukocytosis, unspecified   Acute renal insufficiency   Discharge Condition: Stable and improved  Diet recommendation: Dysphagia 3  Filed Weights   07/01/14 0700 07/03/14 0442 07/04/14 0634  Weight: 60.6 kg (133 lb 9.6 oz) 62.8 kg (138 lb 7.2 oz) 65.6 kg (144 lb 10 oz)    History of present illness:  80 yo female demented fell today at her snf. dtr with pt now. Pt is normally ambulatory. Moderate to advanced dementia. No recent illnesses. She broke her rt hip. Her pain is well controlled at this time. Pt is DNR. Per admitting MD.   Hospital Course:  #1 right intertrochanteric femur fracture  Secondary to mechanical fall. Patient was seen in consultation by orthopedics. Patient subsequently underwent a  ORIF per Dr. Alvan Dame 07/01/2014 without any complications. Patient was seen by physical therapy. Patient be discharged to a skilled nursing facility. Patient is to followup with orthopedics as outpatient. #2 right radial head fracture  Per orthopedics to be managed conservatively with immobilization only. Will need to followup with orthopedics as outpatient.  #3 leukocytosis  Likely reactive leukocytosis. Chest x-ray is  negative. Urinalysis is negative. WBC trending down.   #4 dementia  #5 falls  Fall precautions. PT/OT.  #6 hypertension  Stable. Patient was placed on low-dose Norvasc during the hospitalization.  #7 acute renal insufficiency  Likely secondary to prerenal azotemia. Patient received a dose of IV. Patient was gently hydrated with resolution of acute renal insufficiency.  #8 post op acute blood loss anemia  Patient with no overt GI bleed. Patient's hemoglobin was noted to drop to 8.5 from 12.5 on admission. It was felt this was likely multifactorial secondary to postop blood loss and dilutional component. Patient's hemoglobin stabilized and was 8.3 on day of discharge.      Procedures: CT head/CT C-spine 06/30/2014  X-ray of the right femur 06/30/2014  X-ray of the right forearm 06/30/2014  X-ray of the pelvis 06/30/2014  Chest x-ray 06/30/2014            ORIF right intertrochanteric femur fracture 07/01/2014 per Dr. Alvan Dame   Consultations: Orthopedics: Dr. Rolena Infante 07/01/2014   Discharge Exam: Filed Vitals:   07/04/14 1355  BP: 162/59  Pulse: 98  Temp: 98.2 F (36.8 C)  Resp: 16    General: NAD Cardiovascular: RRR Respiratory: CTAB  Discharge Instructions You were cared for by a hospitalist during your hospital stay. If you have any questions about your discharge medications or the care you received while you were in the hospital after you are discharged, you can call the unit and asked to speak with the hospitalist on call if the hospitalist that took care of you is not available. Once you are discharged, your primary care physician  will handle any further medical issues. Please note that NO REFILLS for any discharge medications will be authorized once you are discharged, as it is imperative that you return to your primary care physician (or establish a relationship with a primary care physician if you do not have one) for your aftercare needs so that they can reassess your need  for medications and monitor your lab values.      Discharge Instructions   Diet general    Complete by:  As directed   Dysphagia 3 diet     Discharge instructions    Complete by:  As directed   Follow up with MD at SNF. Follow up with Dr Alvan Dame in 2 weeks.     Increase activity slowly    Complete by:  As directed      Partial weight bearing    Complete by:  As directed   % Body Weight:  50  Laterality:  right  Extremity:  Lower            Medication List         acetaminophen 500 MG tablet  Commonly known as:  TYLENOL  Take 500 mg by mouth every 6 (six) hours as needed for fever (or for pain).     amLODipine 5 MG tablet  Commonly known as:  NORVASC  Take 1 tablet (5 mg total) by mouth daily.     aspirin 325 MG EC tablet  Take 1 tablet (325 mg total) by mouth 2 (two) times daily.     chlorhexidine 0.12 % solution  Commonly known as:  PERIDEX  Use as directed 15 mLs in the mouth or throat 2 (two) times daily.     docusate sodium 100 MG capsule  Commonly known as:  COLACE  Take 100 mg by mouth 2 (two) times daily.     HYDROcodone-acetaminophen 5-325 MG per tablet  Commonly known as:  NORCO/VICODIN  Take 1-2 tablets by mouth every 6 (six) hours as needed for moderate pain.     LORazepam 0.5 MG tablet  Commonly known as:  ATIVAN  Take 0.5 tablets (0.25 mg total) by mouth every 8 (eight) hours as needed for anxiety.     polyethylene glycol packet  Commonly known as:  MIRALAX / GLYCOLAX  Take 17 g by mouth daily.     risperiDONE 0.25 MG tablet  Commonly known as:  RISPERDAL  Take 1 tablet (0.25 mg total) by mouth daily.     venlafaxine XR 75 MG 24 hr capsule  Commonly known as:  EFFEXOR-XR  Take 75 mg by mouth daily with breakfast.       Allergies  Allergen Reactions  . Adhesive [Tape]     Unknown   . Ciprofloxacin Rash   Follow-up Information   Follow up with Mauri Pole, MD. Schedule an appointment as soon as possible for a visit in 2 weeks.    Specialty:  Orthopedic Surgery   Contact information:   [address] [place] [address] [phone]       Schedule an appointment as soon as possible for a visit in 1 week to follow up. (F/U WITH MD AT SNF)        The results of significant diagnostics from this hospitalization (including imaging, microbiology, ancillary and laboratory) are listed below for reference.    Significant Diagnostic Studies: Dg Chest 2 View  06/30/2014   CLINICAL DATA:  Fall, right forearm pain.  EXAM: CHEST  2 VIEW  COMPARISON:  Chest radiograph April 18, 2014  FINDINGS: Cardiac silhouette is upper limits of normal in size, unchanged. Mediastinal silhouette is nonsuspicious, mildly calcified aortic knob. Similar chronic interstitial changes without pleural effusions or focal consolidations. No pneumothorax, biapical pleural thickening.  Osteopenia.  Surgical clips project in left axilla.  IMPRESSION: Borderline cardiomegaly and chronic interstitial changes.   Electronically Signed   By: Elon Alas   On: 06/30/2014 21:01   Dg Pelvis 1-2 Views  06/30/2014   CLINICAL DATA:  Fall  EXAM: PELVIS - 1-2 VIEW  COMPARISON:  None.  FINDINGS: Nondisplaced right intertrochanteric fracture. No dislocation. Moderate osteoarthritis of the right hip.  No left hip fracture. Generalized osteopenia. Mild degenerative changes of bilateral SI joints and lower lumbar spine.  IMPRESSION: Nondisplaced right intertrochanteric fracture without dislocation.   Electronically Signed   By: Kathreen Devoid   On: 06/30/2014 21:00   Dg Forearm Right  06/30/2014   CLINICAL DATA:  Fall  EXAM: RIGHT FOREARM - 2 VIEW  COMPARISON:  None.  FINDINGS: Displaced right radial head fracture with the volar fragment rotated and displaced anteriorly.  There is no other fracture or dislocation.  There is generalized osteopenia. There are degenerative changes of the triscaphe joint.  IMPRESSION: Displaced right radial head fracture.    Electronically Signed   By: Kathreen Devoid   On: 06/30/2014 21:02   Dg Hip Operative Right  07/01/2014   CLINICAL DATA:  Right hip IM nail  EXAM: DG OPERATIVE RIGHT HIP  TECHNIQUE: A single spot fluoroscopic AP image of the right hip is submitted.  COMPARISON:  Right femur radiographs dated 06/30/2014  FLUOROSCOPY TIME:  38 seconds.  FINDINGS: Intraoperative fluoroscopic images during IM nail and dynamic hip screw fixation of an intertrochanteric right hip fracture.  Fracture fragments are in near-anatomic alignment and position.  IMPRESSION: Intraoperative fluoroscopic radiographs, as above.   Electronically Signed   By: Julian Hy M.D.   On: 07/01/2014 19:47   Dg Femur Right  06/30/2014   CLINICAL DATA:  Fall.  Right femur pain.  EXAM: RIGHT FEMUR - 2 VIEW  COMPARISON:  None.  FINDINGS: A mildly displaced intertrochanteric fracture is present. Chronic degenerative changes are noted the hip with significant loss of joint space. Mild degenerative changes are noted at the knee. No additional fractures are evident.  IMPRESSION: 1. Intertrochanteric fracture of the right femur. 2. Chronic degenerative changes of the hip and knee.   Electronically Signed   By: Lawrence Santiago M.D.   On: 06/30/2014 21:04   Ct Head Wo Contrast  06/30/2014   CLINICAL DATA:  Status post fall  EXAM: CT HEAD WITHOUT CONTRAST  CT CERVICAL SPINE WITHOUT CONTRAST  TECHNIQUE: Multidetector CT imaging of the head and cervical spine was performed following the standard protocol without intravenous contrast. Multiplanar CT image reconstructions of the cervical spine were also generated.  COMPARISON:  04/18/2014 head CT  FINDINGS: CT HEAD FINDINGS  Skull and Sinuses:Negative for fracture or destructive process. The mastoids, middle ears, and imaged paranasal sinuses are clear.  Orbits: Bilateral cataract resection  Brain: No evidence of acute abnormality, such as acute infarction, hemorrhage, hydrocephalus, or mass lesion/mass effect. There  is generalized cerebral volume loss which is age commensurate. Mild chronic small vessel disease, best seen around the lateral ventricles, also expected for age.  CT CERVICAL SPINE FINDINGS  There is no evidence of acute fracture or traumatic subluxation. Diffuse degenerative disc narrowing and facet osteoarthritis. There is no notable osseous  canal or foraminal stenosis. C7-T1 posterior element ankylosis is noted. No prevertebral swelling or gross cervical canal hematoma. There is reticular scarring in the right more than left apices, with bronchiectasis noted on the right.  IMPRESSION: 1. No evidence of acute intracranial or cervical spine injury. 2. Intracranial and cervical spine senescent changes are noted above. 3. Postinfectious biapical lung scarring with bronchiectasis.   Electronically Signed   By: Jorje Guild M.D.   On: 06/30/2014 21:48   Ct Cervical Spine Wo Contrast  06/30/2014   CLINICAL DATA:  Status post fall  EXAM: CT HEAD WITHOUT CONTRAST  CT CERVICAL SPINE WITHOUT CONTRAST  TECHNIQUE: Multidetector CT imaging of the head and cervical spine was performed following the standard protocol without intravenous contrast. Multiplanar CT image reconstructions of the cervical spine were also generated.  COMPARISON:  04/18/2014 head CT  FINDINGS: CT HEAD FINDINGS  Skull and Sinuses:Negative for fracture or destructive process. The mastoids, middle ears, and imaged paranasal sinuses are clear.  Orbits: Bilateral cataract resection  Brain: No evidence of acute abnormality, such as acute infarction, hemorrhage, hydrocephalus, or mass lesion/mass effect. There is generalized cerebral volume loss which is age commensurate. Mild chronic small vessel disease, best seen around the lateral ventricles, also expected for age.  CT CERVICAL SPINE FINDINGS  There is no evidence of acute fracture or traumatic subluxation. Diffuse degenerative disc narrowing and facet osteoarthritis. There is no notable osseous canal  or foraminal stenosis. C7-T1 posterior element ankylosis is noted. No prevertebral swelling or gross cervical canal hematoma. There is reticular scarring in the right more than left apices, with bronchiectasis noted on the right.  IMPRESSION: 1. No evidence of acute intracranial or cervical spine injury. 2. Intracranial and cervical spine senescent changes are noted above. 3. Postinfectious biapical lung scarring with bronchiectasis.   Electronically Signed   By: Jorje Guild M.D.   On: 06/30/2014 21:48   Dg Chest Port 1 View  07/02/2014   CLINICAL DATA:  Chest pain.  Shortness of breath.  EXAM: PORTABLE CHEST - 1 VIEW  COMPARISON:  06/30/2014.  FINDINGS: The cardiac silhouette is mildly enlarged. The lungs remain clear, hyperexpanded and with prominent interstitial markings. Stable left axillary surgical clips and postmastectomy changes on the left. Diffuse osteopenia.  IMPRESSION: 1. No acute abnormality. 2. COPD. 3. Mild cardiomegaly.   Electronically Signed   By: Enrique Sack M.D.   On: 07/02/2014 08:15    Microbiology: Recent Results (from the past 240 hour(s))  URINE CULTURE     Status: None   Collection Time    07/01/14 10:25 AM      Result Value Ref Range Status   Specimen Description URINE, CATHETERIZED   Final   Special Requests NONE   Final   Culture  Setup Time     Final   Value: 07/01/2014 14:59     Performed at [place]     Final   Value: NO GROWTH     Performed at [facility]   Culture     Final   Value: NO GROWTH     Performed at [facility]   Report Status 07/02/2014 FINAL   Final     Labs: Basic Metabolic Panel:  Recent Labs Lab 06/30/14 2149 07/01/14 0420 07/02/14 0455 07/03/14 0421 07/04/14 0416  NA 138 139 139 138 139  K 3.6* 3.5* 3.7 3.5* 3.4*  CL 99 102 104 101 105  CO2 26 24 26  25  25  GLUCOSE 127* 128* 142* 114* 110*  BUN 16 16 13  27* 16  CREATININE 0.65 0.58 0.54 1.16* 0.65  CALCIUM 9.0 8.9 8.2* 8.0*  7.7*  MG  --  1.9  --   --   --    Liver Function Tests:  Recent Labs Lab 06/30/14 2149  AST 19  ALT 11  ALKPHOS 124*  BILITOT 0.5  PROT 7.3  ALBUMIN 3.7   No results found for this basename: LIPASE, AMYLASE,  in the last 168 hours No results found for this basename: AMMONIA,  in the last 168 hours CBC:  Recent Labs Lab 06/30/14 2149 07/01/14 0420 07/02/14 0455 07/03/14 0421 07/04/14 0820  WBC 12.2* 12.7* 13.4* 10.2 6.8  NEUTROABS 10.4* 10.4* 11.5*  --   --   HGB 12.5 11.7* 10.4* 8.5* 8.3*  HCT 37.1 34.8* 31.0* 25.3* 24.1*  MCV 90.5 90.9 91.4 92.0 91.3  PLT 162 151 146* 132* 127*   Cardiac Enzymes: No results found for this basename: CKTOTAL, CKMB, CKMBINDEX, TROPONINI,  in the last 168 hours BNP: BNP (last 3 results)  Recent Labs  07/02/14 0455  PROBNP 1610.0*   CBG: No results found for this basename: GLUCAP,  in the last 168 hours     Signed:  [facility] MD [facility] 07/04/2014, 2:11 PM

## 2014-07-04 NOTE — Clinical Social Work Note (Signed)
Plans confirmed for dc to SNF bed at [facility] today- patient and her 2 daughters are in agreement with plans- EMS to transport.  Eduard Clos, MSW, [place]

## 2014-07-04 NOTE — Clinical Documentation Improvement (Signed)
Clinical Social Work Department CLINICAL SOCIAL WORK PLACEMENT NOTE 07/04/2014  Patient:  SHAKEITHA, UMBAUGH  Account Number:  [id number] Admit date:  06/30/2014  Clinical Social Worker:  Daiva Huge  Date/time:  07/04/2014 02:44 PM  Clinical Social Work is seeking post-discharge placement for this patient at the following level of care:   SKILLED NURSING   (*CSW will update this form in Epic as items are completed)   07/04/2014  Patient/family provided with [place] Department of Clinical Social Work's list of facilities offering this level of care within the geographic area requested by the patient (or if unable, by the patient's family).  07/04/2014  Patient/family informed of their freedom to choose among providers that offer the needed level of care, that participate in Medicare, Medicaid or managed care program needed by the patient, have an available bed and are willing to accept the patient.  07/04/2014  Patient/family informed of [facility]' ownership interest in [facility], as well as of the fact that they are under no obligation to receive care at this facility.  PASARR submitted to EDS on 07/04/2014 PASARR number received on 07/04/2014  FL2 transmitted to all facilities in geographic area requested by pt/family on  07/04/2014 FL2 transmitted to all facilities within larger geographic area on   Patient informed that his/her managed care company has contracts with or will negotiate with  certain facilities, including the following:     Patient/family informed of bed offers received:  07/04/2014 Patient chooses bed at [facility] Physician recommends and patient chooses bed at    Patient to be transferred to [place] [place] [facility] on  07/04/2014 Patient to be transferred to facility by [facility] Patient and family notified of transfer on 07/04/2014 Name of family member notified:  daughter Jenny Reichmann  The following  physician request were entered in Epic:   Additional Comments:   Eduard Clos, MSW, [place]

## 2014-07-04 NOTE — Clinical Social Work Note (Signed)
Family has accepted SNF bed at [facility]- they are a little hesitant with their decision but understand that this is the level of care recommended- they also understand they can have her transferred back to [facility] when they decide as they had accepted her back as of Friday. MD updated on above and will finalize dc orders for d/c today-   Eduard Clos, MSW, [place]

## 2014-07-04 NOTE — Progress Notes (Signed)
Patient ID: Bethany Fox, female   DOB: 1917/04/04, 80 y.o.   MRN: [id number] Subjective: 3 Days Post-Op Procedure(s) (LRB): OPEN REDUCTION INTERNAL FIXATION HIP (Right) + right closed comminuted radial head fracture  Still confused, baseline dementia.  Pulled foley out last night    Patient confused thus pain assessment not reliable, though moving both injured extremities without apparent distress  Objective:   VITALS:   Filed Vitals:   07/04/14 0635  BP: 121/47  Pulse: 90  Temp: 98.4 F (36.9 C)  Resp: 16    Incision: scant drainage  Along posterior incision line, dressing removed  LABS  Recent Labs  07/02/14 0455 07/03/14 0421 07/04/14 0820  HGB 10.4* 8.5* 8.3*  HCT 31.0* 25.3* 24.1*  WBC 13.4* 10.2 6.8  PLT 146* 132* 127*     Recent Labs  07/02/14 0455 07/03/14 0421 07/04/14 0416  NA 139 138 139  K 3.7 3.5* 3.4*  BUN 13 27* 16  CREATININE 0.54 1.16* 0.65  GLUCOSE 142* 114* 110*    No results found for this basename: LABPT, INR,  in the last 72 hours   Assessment/Plan: 3 Days Post-Op Procedure(s) (LRB): OPEN REDUCTION INTERNAL FIXATION HIP (Right)   Advance diet Up with therapy Discharge to SNF when stable  Will have nursing clean posterior wound and redress today, particularly in setting of incontinence

## 2014-07-04 NOTE — Progress Notes (Signed)
PT Cancellation Note  ___Treatment cancelled today due to medical issues with patient which prohibited therapy  ___ Treatment cancelled today due to patient receiving procedure or test   ___ Treatment cancelled today due to patient's refusal to participate   _X_ Treatment cancelled today due to pt all set to D/C to [facility] today  Rica Koyanagi  PTA [facility]  Acute  Rehab Pager      [phone]

## 2014-07-04 NOTE — Evaluation (Signed)
Occupational Therapy Evaluation Patient Details Name: Bethany Fox MRN: [id number] DOB: 1917/02/11 Today's Date: 07/04/2014    History of Present Illness R hip fx s/p ORIF; R radial head fx - R UE in sling   Clinical Impression   Pt presents to OT with dependence with ADL activity s/p fall.  Pt very limited due to coginitive status.  Pt will benefit from skilled OT to manage edema of BUE and regain I with simple ADL activity .    Follow Up Recommendations  SNF    Equipment Recommendations  None recommended by OT       Precautions / Restrictions Precautions Precautions: Fall Required Braces or Orthoses: Sling Restrictions Weight Bearing Restrictions: Yes RUE Weight Bearing: Non weight bearing RLE Weight Bearing: Partial weight bearing RLE Partial Weight Bearing Percentage or Pounds: 50%      Mobility Bed Mobility               General bed mobility comments: did not perform this OT session  Transfers                           ADL Overall ADL's : Needs assistance/impaired                                       General ADL Comments: pt currently total A with all ADL activity. OT sesion focused on positioning and edema control BUE.  Pts daugther provided with edccation regarding edema control and positioning.     Vision                 Additional Comments: pts eyes closed entire OT session.           Hand Dominance  right   Extremity/Trunk Assessment Upper Extremity Assessment Upper Extremity Assessment: LUE deficits/detail;RUE deficits/detail RUE Deficits / Details: RUE presents with edema in fingers.  OT did prop RUE on 2 pillows and educated daughter on positioining.  Did not attempt finger ROM or retrograde massage as daugther felt pt would become agitated LUE Deficits / Details: LUE also presents with edema in lower and upper arm. Pt did have masectomy on LUE years ago and this edema in new in last few monthes.  OT  edcuated pts daugther on elevating LUE also for edema constrol           Communication  limited this day to mumbling   Cognition Arousal/Alertness: Lethargic Behavior During Therapy:  (eyes closed and pt mumbling entire OT session) Overall Cognitive Status: History of cognitive impairments - at baseline                                [place] expects to be discharged to:: Skilled nursing facility                                 Additional Comments: unless ALF would be able to provide care      Prior Functioning/Environment Level of Independence: Needs assistance    ADL's / Homemaking Assistance Needed: Pt lived in ALF memory care unit        OT Diagnosis: Generalized weakness   OT Problem List: Decreased strength;Decreased activity tolerance;Impaired UE functional use;Decreased cognition   OT Treatment/Interventions: Self-care/ADL  training;Patient/family education;DME and/or AE instruction;Therapeutic activities;Therapeutic exercise    OT Goals(Current goals can be found in the care plan section)    OT Frequency: Min 2X/week   Barriers to D/C: Decreased caregiver support (pt from ALF)             End of Session    Activity Tolerance: Treatment limited secondary to agitation;Patient limited by lethargy Patient left: in bed;with family/visitor present;with call bell/phone within reach   Time: 3005-1102 OT Time Calculation (min): 21 min Charges:  OT General Charges $OT Visit: 1 Procedure OT Evaluation $Initial OT Evaluation Tier I: 1 Procedure OT Treatments $Therapeutic Activity: 8-22 mins G-Codes:    Payton Mccallum D 02-Aug-2014, 10:33 AM

## 2014-07-04 NOTE — Progress Notes (Signed)
Report called to [facility] RN accepting report for facility. Facility voiced concerns over Hgb. 8.3 post op Right Hip. MD paged and discussed concerns. Pt with mild oozing to right hip but no overt bleeding. VSS and no acute changes noted in Pt's assessment at time of transfer to SNF. Per MD PT stable to be discharged to SNF.

## 2014-07-04 NOTE — Care Management Note (Signed)
    Page 1 of 2   07/04/2014     1:43:48 PM CARE MANAGEMENT NOTE 07/04/2014  Patient:  Bethany Fox, Bethany Fox   Account Number:  [id number]  Date Initiated:  07/01/2014  Documentation initiated by:  DAVIS,RHONDA  Subjective/Objective Assessment:   pt fell at snf sustaining a Right hip IT fracture     Action/Plan:   or planned for [id number]   Anticipated DC Date:  07/04/2014   Anticipated DC Plan:  SKILLED NURSING FACILITY  In-house referral  Clinical Social Worker      DC Planning Services  CM consult      [facility] Choice  NA   Choice offered to / List presented to:  NA      DME agency  NA     [place] arranged  NA      [place] agency  NA   Status of service:  Completed, signed off Medicare Important Message given?  YES (If response is "NO", the following Medicare IM given date fields will be blank) Date Medicare IM given:  07/04/2014 Medicare IM given by:  [facility] Date Additional Medicare IM given:   Additional Medicare IM given by:    Discharge Disposition:  [place]  Per UR Regulation:  Reviewed for med. necessity/level of care/duration of stay  If discussed at [place] of Stay Meetings, dates discussed:    Comments:  07/04/14 Janaisa Birkland RN,BSN NCM [phone] D/C PLAN SNF.  Rhonda Davis,RN,BSN,CCM

## 2014-07-04 NOTE — Clinical Social Work Note (Signed)
CSW spoke with daughters Jenny Reichmann and Dot- we have discussed the options of returning to ALF -vs- SNF for a short period- PT is recommending SNF and family is now agreeable to this- I have faxed her FL2 out to area SNF's and will work to get her a PASARR (level 2- 30 day) and advise. Patient is medically stable for dc per MD- will hope to finalize a plan, get PASARR# and bed offers for dc later today.  Eduard Clos, MSW, [place]

## 2014-07-07 DIAGNOSIS — D72829 Elevated white blood cell count, unspecified: Secondary | ICD-10-CM | POA: Diagnosis not present

## 2014-07-07 DIAGNOSIS — N171 Acute kidney failure with acute cortical necrosis: Secondary | ICD-10-CM | POA: Diagnosis not present

## 2014-07-07 DIAGNOSIS — S72009A Fracture of unspecified part of neck of unspecified femur, initial encounter for closed fracture: Secondary | ICD-10-CM | POA: Diagnosis not present

## 2014-07-07 DIAGNOSIS — F0390 Unspecified dementia without behavioral disturbance: Secondary | ICD-10-CM | POA: Diagnosis not present

## 2014-07-07 DIAGNOSIS — D500 Iron deficiency anemia secondary to blood loss (chronic): Secondary | ICD-10-CM | POA: Diagnosis not present

## 2014-07-07 DIAGNOSIS — I10 Essential (primary) hypertension: Secondary | ICD-10-CM | POA: Diagnosis not present

## 2014-07-16 ENCOUNTER — Encounter (HOSPITAL_COMMUNITY): Payer: Self-pay | Admitting: Emergency Medicine

## 2014-07-16 ENCOUNTER — Emergency Department (HOSPITAL_COMMUNITY)
Admission: EM | Admit: 2014-07-16 | Discharge: 2014-07-17 | Disposition: A | Discharge status: Skilled Nursing Facility | Payer: Medicare Other | Attending: Emergency Medicine | Admitting: Emergency Medicine

## 2014-07-16 DIAGNOSIS — Z853 Personal history of malignant neoplasm of breast: Secondary | ICD-10-CM | POA: Diagnosis not present

## 2014-07-16 DIAGNOSIS — Y838 Other surgical procedures as the cause of abnormal reaction of the patient, or of later complication, without mention of misadventure at the time of the procedure: Secondary | ICD-10-CM | POA: Insufficient documentation

## 2014-07-16 DIAGNOSIS — Z8719 Personal history of other diseases of the digestive system: Secondary | ICD-10-CM | POA: Insufficient documentation

## 2014-07-16 DIAGNOSIS — F329 Major depressive disorder, single episode, unspecified: Secondary | ICD-10-CM | POA: Insufficient documentation

## 2014-07-16 DIAGNOSIS — Z7982 Long term (current) use of aspirin: Secondary | ICD-10-CM | POA: Diagnosis not present

## 2014-07-16 DIAGNOSIS — F0390 Unspecified dementia without behavioral disturbance: Secondary | ICD-10-CM | POA: Diagnosis not present

## 2014-07-16 DIAGNOSIS — Z79899 Other long term (current) drug therapy: Secondary | ICD-10-CM | POA: Insufficient documentation

## 2014-07-16 DIAGNOSIS — F3289 Other specified depressive episodes: Secondary | ICD-10-CM | POA: Diagnosis not present

## 2014-07-16 DIAGNOSIS — IMO0002 Reserved for concepts with insufficient information to code with codable children: Secondary | ICD-10-CM | POA: Diagnosis not present

## 2014-07-16 DIAGNOSIS — L7632 Postprocedural hematoma of skin and subcutaneous tissue following other procedure: Secondary | ICD-10-CM

## 2014-07-16 NOTE — ED Notes (Signed)
Bed: WA17 Expected date: 07/16/14 Expected time: 10:19 PM Means of arrival: Ambulance Comments: 80 yo F  Post op problem

## 2014-07-16 NOTE — Discharge Instructions (Signed)
°  The wound on the right hip, is draining old blood. She will be covered with an antibiotic prescription for possible associated infection.  Continue to dress the wound on the right hip, and change as needed for drainage and bleeding.  She does not need a splint or sling on the right arm.  Patient is to followup with her orthopedist in 2 days.

## 2014-07-16 NOTE — ED Notes (Signed)
Pt presented by EMS from [facility], facility called with c/o of post surgical site bleed. No active bleed noted on the right hip on assessment, pt denies pain, facility requested evaluation per EMS report.

## 2014-07-16 NOTE — ED Provider Notes (Signed)
CSN: [id number]     Arrival date & time 07/16/14  2235 History   First MD Initiated Contact with Patient 07/16/14 2242     Chief Complaint  Patient presents with  . Post-op Problem    Facilty requesting evaluation.     (Consider location/radiation/quality/duration/timing/severity/associated sxs/prior Treatment) The history is provided by the patient.   Bethany Fox is a 80 y.o. female sent here for evaluation of bleeding from hip surgery wound. He, apparently began bleeding tonight. No reported problems. She is unable to contribute to history. She was recently discharged, to a nursing care facility, for rehabilitation, following repair of hip fracture. There are no other reported complaints.   Level 5 caveat    Past Medical History  Diagnosis Date  . Gastric ulcer   . Cancer     breast  . Depression   . Dementia   . Breast cancer   . H/O mastectomy     Left side    Past Surgical History  Procedure Laterality Date  . Cholecystectomy    . Mastectomy    . Orif hip fracture Right 07/01/2014    Procedure: OPEN REDUCTION INTERNAL FIXATION HIP;  Surgeon: Mauri Pole, MD;  Location: [facility];  Service: Orthopedics;  Laterality: Right;   History reviewed. No pertinent family history. History  Substance Use Topics  . Smoking status: Never Smoker   . Smokeless tobacco: Not on file  . Alcohol Use: No   OB History   Grav Para Term Preterm Abortions TAB SAB Ect Mult Living                 Review of Systems  Unable to perform ROS     Allergies  Adhesive and Ciprofloxacin  Home Medications   Prior to Admission medications   Medication Sig Start Date End Date Taking? Authorizing Provider  acetaminophen (TYLENOL) 500 MG tablet Take 500 mg by mouth every 6 (six) hours as needed for fever (or for pain).     Historical Provider, MD  amLODipine (NORVASC) 5 MG tablet Take 1 tablet (5 mg total) by mouth daily. 07/04/14   Eugenie Filler, MD  aspirin EC 325 MG EC tablet  Take 1 tablet (325 mg total) by mouth 2 (two) times daily. 07/02/14 07/30/14  Lucille Passy Babish, PA-C  chlorhexidine (PERIDEX) 0.12 % solution Use as directed 15 mLs in the mouth or throat 2 (two) times daily.    Historical Provider, MD  docusate sodium (COLACE) 100 MG capsule Take 100 mg by mouth 2 (two) times daily.    Historical Provider, MD  HYDROcodone-acetaminophen (NORCO/VICODIN) 5-325 MG per tablet Take 1-2 tablets by mouth every 6 (six) hours as needed for moderate pain. 07/02/14   Lucille Passy Babish, PA-C  LORazepam (ATIVAN) 0.5 MG tablet Take 0.5 tablets (0.25 mg total) by mouth every 8 (eight) hours as needed for anxiety. 07/04/14   Eugenie Filler, MD  polyethylene glycol [facility] Floria Raveling) packet Take 17 g by mouth daily.    Historical Provider, MD  risperiDONE (RISPERDAL) 0.25 MG tablet Take 1 tablet (0.25 mg total) by mouth daily. 05/05/14   Susy Frizzle, MD  venlafaxine XR (EFFEXOR-XR) 75 MG 24 hr capsule Take 75 mg by mouth daily with breakfast.    Historical Provider, MD   BP 184/62  Pulse 87  Temp(Src) 98.4 F (36.9 C) (Oral)  Resp 20  SpO2 97% Physical Exam  Nursing note and vitals reviewed. Constitutional: She appears well-developed.  Elderly,  frail  HENT:  Head: Normocephalic and atraumatic.  Eyes: Conjunctivae and EOM are normal. Pupils are equal, round, and reactive to light.  Neck: Normal range of motion and phonation normal. Neck supple.  Cardiovascular: Normal rate, regular rhythm and intact distal pulses.   Pulmonary/Chest: Effort normal and breath sounds normal. She exhibits no tenderness.  Abdominal: Soft. She exhibits no distension. There is no tenderness. There is no guarding.  Musculoskeletal:  Right lateral hip: Surgical wound has a small dehiscence in the middle aspect, it is draining material, fluid, consistent with old blood. There is an associated subcutaneous mass consistent with hematoma. There is mild warmth of the mass, without frank erythema  or purulent drainage associated. Right wrist is mildly swollen. There is mild edema of the right forearm. She has fair motion of the right wrist and right elbow, and normal distal intact sensation and circulation of the fingers of the right hand.  Neurological: She is alert. She exhibits normal muscle tone.  Skin: Skin is warm and dry.  Psychiatric: Her behavior is normal.    ED Course  Procedures (including critical care time)  22:55- request call to orthopedics. I spoke with Dr. Gladstone Lighter. He recommends symptomatic treatment, coverage with antibiotics and office followup.   23:10- patient's daughter is here, and I discussed the findings and showed her the wound. She states that the patient continually removes the splint from her right elbow.  She understands the treatment plan.  Labs Review Labs Reviewed - No data to display  Imaging Review No results found.   EKG Interpretation None      MDM   Final diagnoses:  Postoperative hematoma of subcutaneous tissue following non-dermatologic procedure    Subcutaneous hematoma, postoperative. This does not appear to be infected. I have contacted the on-call orthopedist, for her physician. He feels comfortable with the patient going home and following up in the office. The patient has a followup appointment in 2 days.  Nursing Notes Reviewed/ Care Coordinated Applicable Imaging Reviewed Interpretation of Laboratory Data incorporated into ED treatment  The patient appears reasonably screened and/or stabilized for discharge and I doubt any other medical condition or other [facility] requiring further screening, evaluation, or treatment in the ED at this time prior to discharge.  Plan: Home Medications- Keflex; Home Treatments- Wound Care, Discontinue splinting of right wrist; return here if the recommended treatment, does not improve the symptoms; Recommended follow up- Ortho as scheduled   Richarda Blade, MD 07/17/14 1320

## 2014-07-17 MED ORDER — CEPHALEXIN 500 MG PO CAPS: 500.0000 mg | ORAL_CAPSULE | Freq: Four times a day (QID) | ORAL | Status: DC

## 2014-07-17 NOTE — ED Provider Notes (Signed)
8:32 AM The nursing home called saying there was no prescription with the patient's paperwork. I reviewed the chart and it appears that Dr. Eulis Foster wanted the patient on Keflex. I will provide a prescription for the patient.   Blanchard Kelch, MD 07/17/14 [phone]

## 2014-07-18 DIAGNOSIS — S52123A Displaced fracture of head of unspecified radius, initial encounter for closed fracture: Secondary | ICD-10-CM | POA: Diagnosis not present

## 2014-07-18 DIAGNOSIS — Z8781 Personal history of (healed) traumatic fracture: Secondary | ICD-10-CM | POA: Diagnosis not present

## 2014-07-20 ENCOUNTER — Telehealth: Payer: Self-pay | Admitting: Family Medicine

## 2014-07-20 NOTE — Telephone Encounter (Signed)
Called and spoke to pt daughter the mother is in nursing home right now

## 2014-08-12 DIAGNOSIS — Z4789 Encounter for other orthopedic aftercare: Secondary | ICD-10-CM | POA: Diagnosis not present

## 2014-08-12 DIAGNOSIS — Z8781 Personal history of (healed) traumatic fracture: Secondary | ICD-10-CM | POA: Diagnosis not present

## 2014-08-12 DIAGNOSIS — S5290XD Unspecified fracture of unspecified forearm, subsequent encounter for closed fracture with routine healing: Secondary | ICD-10-CM | POA: Diagnosis not present

## 2014-08-25 DIAGNOSIS — S72009A Fracture of unspecified part of neck of unspecified femur, initial encounter for closed fracture: Secondary | ICD-10-CM | POA: Diagnosis not present

## 2014-08-25 DIAGNOSIS — F3289 Other specified depressive episodes: Secondary | ICD-10-CM | POA: Diagnosis not present

## 2014-08-25 DIAGNOSIS — F0390 Unspecified dementia without behavioral disturbance: Secondary | ICD-10-CM | POA: Diagnosis not present

## 2014-08-25 DIAGNOSIS — I10 Essential (primary) hypertension: Secondary | ICD-10-CM | POA: Diagnosis not present

## 2014-08-25 DIAGNOSIS — D509 Iron deficiency anemia, unspecified: Secondary | ICD-10-CM | POA: Diagnosis not present

## 2014-08-25 DIAGNOSIS — F329 Major depressive disorder, single episode, unspecified: Secondary | ICD-10-CM | POA: Diagnosis not present

## 2014-08-30 DIAGNOSIS — F0390 Unspecified dementia without behavioral disturbance: Secondary | ICD-10-CM | POA: Diagnosis not present

## 2014-08-30 DIAGNOSIS — R238 Other skin changes: Secondary | ICD-10-CM | POA: Diagnosis not present

## 2014-08-30 DIAGNOSIS — Z4789 Encounter for other orthopedic aftercare: Secondary | ICD-10-CM | POA: Diagnosis not present

## 2014-08-30 DIAGNOSIS — M6281 Muscle weakness (generalized): Secondary | ICD-10-CM | POA: Diagnosis not present

## 2014-08-30 DIAGNOSIS — R609 Edema, unspecified: Secondary | ICD-10-CM | POA: Diagnosis not present

## 2014-08-30 DIAGNOSIS — R269 Unspecified abnormalities of gait and mobility: Secondary | ICD-10-CM | POA: Diagnosis not present

## 2014-08-30 DIAGNOSIS — Z9181 History of falling: Secondary | ICD-10-CM | POA: Diagnosis not present

## 2014-08-30 DIAGNOSIS — I10 Essential (primary) hypertension: Secondary | ICD-10-CM | POA: Diagnosis not present

## 2014-08-30 DIAGNOSIS — D72829 Elevated white blood cell count, unspecified: Secondary | ICD-10-CM | POA: Diagnosis not present

## 2014-08-30 DIAGNOSIS — S72009D Fracture of unspecified part of neck of unspecified femur, subsequent encounter for closed fracture with routine healing: Secondary | ICD-10-CM | POA: Diagnosis not present

## 2014-09-02 DIAGNOSIS — R238 Other skin changes: Secondary | ICD-10-CM | POA: Diagnosis not present

## 2014-09-02 DIAGNOSIS — R609 Edema, unspecified: Secondary | ICD-10-CM | POA: Diagnosis not present

## 2014-10-03 ENCOUNTER — Emergency Department (HOSPITAL_COMMUNITY): Payer: Medicare Other

## 2014-10-03 ENCOUNTER — Emergency Department (HOSPITAL_COMMUNITY)
Admission: EM | Admit: 2014-10-03 | Discharge: 2014-10-03 | Disposition: A | Discharge status: Home / Self Care | Payer: Medicare Other | Attending: Emergency Medicine | Admitting: Emergency Medicine

## 2014-10-03 ENCOUNTER — Encounter (HOSPITAL_COMMUNITY): Payer: Self-pay | Admitting: Emergency Medicine

## 2014-10-03 DIAGNOSIS — Z853 Personal history of malignant neoplasm of breast: Secondary | ICD-10-CM | POA: Insufficient documentation

## 2014-10-03 DIAGNOSIS — S5011XA Contusion of right forearm, initial encounter: Secondary | ICD-10-CM | POA: Insufficient documentation

## 2014-10-03 DIAGNOSIS — Z9010 Acquired absence of unspecified breast and nipple: Secondary | ICD-10-CM | POA: Insufficient documentation

## 2014-10-03 DIAGNOSIS — Z8719 Personal history of other diseases of the digestive system: Secondary | ICD-10-CM | POA: Diagnosis not present

## 2014-10-03 DIAGNOSIS — S51811A Laceration without foreign body of right forearm, initial encounter: Secondary | ICD-10-CM

## 2014-10-03 DIAGNOSIS — W19XXXA Unspecified fall, initial encounter: Secondary | ICD-10-CM

## 2014-10-03 DIAGNOSIS — Z7982 Long term (current) use of aspirin: Secondary | ICD-10-CM | POA: Diagnosis not present

## 2014-10-03 DIAGNOSIS — S51801A Unspecified open wound of right forearm, initial encounter: Secondary | ICD-10-CM | POA: Insufficient documentation

## 2014-10-03 DIAGNOSIS — Z792 Long term (current) use of antibiotics: Secondary | ICD-10-CM | POA: Diagnosis not present

## 2014-10-03 DIAGNOSIS — M25519 Pain in unspecified shoulder: Secondary | ICD-10-CM | POA: Diagnosis not present

## 2014-10-03 DIAGNOSIS — W010XXA Fall on same level from slipping, tripping and stumbling without subsequent striking against object, initial encounter: Secondary | ICD-10-CM | POA: Diagnosis not present

## 2014-10-03 DIAGNOSIS — Y92128 Other place in nursing home as the place of occurrence of the external cause: Secondary | ICD-10-CM | POA: Diagnosis not present

## 2014-10-03 DIAGNOSIS — Y9389 Activity, other specified: Secondary | ICD-10-CM | POA: Insufficient documentation

## 2014-10-03 DIAGNOSIS — F0390 Unspecified dementia without behavioral disturbance: Secondary | ICD-10-CM | POA: Insufficient documentation

## 2014-10-03 DIAGNOSIS — M79631 Pain in right forearm: Secondary | ICD-10-CM | POA: Diagnosis not present

## 2014-10-03 DIAGNOSIS — F329 Major depressive disorder, single episode, unspecified: Secondary | ICD-10-CM | POA: Insufficient documentation

## 2014-10-03 DIAGNOSIS — S59911A Unspecified injury of right forearm, initial encounter: Secondary | ICD-10-CM | POA: Diagnosis present

## 2014-10-03 DIAGNOSIS — S56201A Unspecified injury of other flexor muscle, fascia and tendon at forearm level, right arm, initial encounter: Secondary | ICD-10-CM | POA: Diagnosis not present

## 2014-10-03 DIAGNOSIS — S40811A Abrasion of right upper arm, initial encounter: Secondary | ICD-10-CM | POA: Diagnosis not present

## 2014-10-03 NOTE — ED Notes (Signed)
Bed: ZL93 Expected date:  Expected time:  Means of arrival:  Comments: EMS 80 yo from SNF/fall-right arm pain

## 2014-10-03 NOTE — ED Notes (Signed)
Pt uses wheel chair. Walks with physical therapy x3 days a week. NP made aware.

## 2014-10-03 NOTE — ED Provider Notes (Signed)
CSN: [id number]     Arrival date & time 10/03/14  0155 History   First MD Initiated Contact with Patient 10/03/14 0204     Chief Complaint  Patient presents with  . Fall     (Consider location/radiation/quality/duration/timing/severity/associated sxs/prior Treatment) HPI Comments: This is a 80 year old female brought in by from [place] after a witnessed fall by EMS, with a skin tear to her right forearm.  They state they were passing her room.  When I saw her fall to the ground from a standing position.  She did not hit her head back or neck.  She was at her normal alertness. Patient has a history of dementia she does not report any pain except to her forearm.   Patient is a 80 y.o. female presenting with fall. The history is provided by the patient and a relative.  Fall This is a new problem. The current episode started today. The problem has been unchanged. Pertinent negatives include no headaches, joint swelling or vomiting. Nothing aggravates the symptoms. She has tried nothing for the symptoms. The treatment provided no relief.    Past Medical History  Diagnosis Date  . Gastric ulcer   . Cancer     breast  . Depression   . Dementia   . Breast cancer   . H/O mastectomy     Left side    Past Surgical History  Procedure Laterality Date  . Cholecystectomy    . Mastectomy    . Orif hip fracture Right 07/01/2014    Procedure: OPEN REDUCTION INTERNAL FIXATION HIP;  Surgeon: Mauri Pole, MD;  Location: [facility];  Service: Orthopedics;  Laterality: Right;   History reviewed. No pertinent family history. History  Substance Use Topics  . Smoking status: Never Smoker   . Smokeless tobacco: Not on file  . Alcohol Use: No   OB History   Grav Para Term Preterm Abortions TAB SAB Ect Mult Living                 Review of Systems  Gastrointestinal: Negative for vomiting.  Musculoskeletal: Negative for joint swelling.  Skin: Positive for wound.  Neurological:  Negative for dizziness and headaches.  All other systems reviewed and are negative.     Allergies  Adhesive and Ciprofloxacin  Home Medications   Prior to Admission medications   Medication Sig Start Date End Date Taking? Authorizing Provider  amLODipine (NORVASC) 5 MG tablet Take 1 tablet (5 mg total) by mouth daily. 07/04/14  Yes Eugenie Filler, MD  ampicillin (PRINCIPEN) 500 MG capsule Take 500 mg by mouth 3 (three) times daily.   Yes Historical Provider, MD  aspirin 325 MG EC tablet Take 325 mg by mouth 2 (two) times daily.   Yes Historical Provider, MD  chlorhexidine (PERIDEX) 0.12 % solution Use as directed 15 mLs in the mouth or throat 2 (two) times daily.   Yes Historical Provider, MD  cholecalciferol (VITAMIN D) 1000 UNITS tablet Take 2,000 Units by mouth daily.   Yes Historical Provider, MD  docusate sodium (COLACE) 100 MG capsule Take 100 mg by mouth 2 (two) times daily.   Yes Historical Provider, MD  LORazepam (ATIVAN) 0.5 MG tablet Take 0.25 mg by mouth every 8 (eight) hours as needed for anxiety.   Yes Historical Provider, MD  mirtazapine (REMERON) 7.5 MG tablet Take 7.5 mg by mouth at bedtime.   Yes Historical Provider, MD  risperiDONE (RISPERDAL) 0.25 MG tablet Take 0.25 mg by  mouth at bedtime.   Yes Historical Provider, MD  venlafaxine XR (EFFEXOR-XR) 75 MG 24 hr capsule Take 75 mg by mouth daily with breakfast.   Yes Historical Provider, MD  acetaminophen (TYLENOL) 500 MG tablet Take 500 mg by mouth every 6 (six) hours as needed for fever (or for pain).     Historical Provider, MD  HYDROcodone-acetaminophen (NORCO/VICODIN) 5-325 MG per tablet Take 1-2 tablets by mouth every 6 (six) hours as needed for moderate pain. 07/02/14   Lucille Passy Babish, PA-C   BP 143/54  Pulse 95  Temp(Src) 97.9 F (36.6 C) (Oral)  Resp 18  Ht 5' (1.524 m)  Wt 138 lb (62.596 kg)  BMI 26.95 kg/m2  SpO2 100% Physical Exam  Nursing note and vitals reviewed. Constitutional: She is  oriented to person, place, and time. She appears well-developed and well-nourished.  HENT:  Head: Normocephalic.  Eyes: Pupils are equal, round, and reactive to light.  Cardiovascular: Normal rate.   Pulmonary/Chest: Effort normal.  Musculoskeletal: Normal range of motion.  No pain with pelvic rock, no leg shortening, no neck pain full ROM of all joints  Neurological: She is alert and oriented to person, place, and time.  Skin: Skin is warm. No erythema.  Small skin tear lateral R forearm     ED Course  Procedures (including critical care time) Labs Review Labs Reviewed - No data to display  Imaging Review Dg Forearm Right  10/03/2014   CLINICAL DATA:  Initial evaluation for fall, now with right forearm pain. Abrasion and midshaft posterior surface. Recent radial head fracture.  EXAM: RIGHT FOREARM - 2 VIEW  COMPARISON:  Prior radiograph from 06/30/2014  FINDINGS: No acute fracture or dislocation. Osseous irregularity about the radial head noted, like related to recent radial head fracture. There remains slight impaction at this site. Overall alignment is stable relative to prior radiograph. No soft tissue abnormality. No radiopaque foreign body. Diffuse osteopenia noted.  IMPRESSION: 1. No acute fracture or dislocation. 2. Deformity about the radial head with slight impaction, compatible with recent radial head fracture. Overall alignment about this fracture is not significantly changed relative to 06/30/2014. 3. Osteopenia.   Electronically Signed   By: Jeannine Boga M.D.   On: 10/03/2014 03:28     EKG Interpretation None     History reviewed.  No new fractures.  We'll distinctly then dressed, not requiring any sutures.  Patient is to follow up with her primary care physician MDM   Final diagnoses:  Fall, initial encounter  Forearm contusion, right, initial encounter  Skin tear of forearm without complication, right, initial encounter         Garald Balding,  NP 10/03/14 0403

## 2014-10-03 NOTE — ED Provider Notes (Signed)
Medical screening examination/treatment/procedure(s) were conducted as a shared visit with non-physician practitioner(s) and myself.  I personally evaluated the patient during the encounter.   EKG Interpretation None     Seen and examined PERRL No head injury MMM Supple neck RRR CTAB NABS, soft NT FROM x 4 extremities  D/c to [facility] Alfonso Patten, MD 10/03/14 7619

## 2014-10-03 NOTE — Discharge Instructions (Signed)
There is no new fracture seen on x-ray.  Patient.  Skin care has been cleaned and dressed.  Please keep this area covered for the next several days to allow for healing Followup with your primary care physician as needed

## 2014-10-03 NOTE — ED Notes (Addendum)
Pt comes from [facility] pt slipped and fell did not hit her head per EMS after they witnessed pt fall. Pt has dementia and c/o right arm pain and skin tear to right forearm. Pt family member states pt is at baseline. Pt is alert and oriented per baseline.

## 2014-10-14 ENCOUNTER — Other Ambulatory Visit: Payer: Self-pay

## 2014-10-25 DIAGNOSIS — M79661 Pain in right lower leg: Secondary | ICD-10-CM | POA: Diagnosis not present

## 2014-10-25 DIAGNOSIS — M79621 Pain in right upper arm: Secondary | ICD-10-CM | POA: Diagnosis not present

## 2014-10-25 DIAGNOSIS — M79651 Pain in right thigh: Secondary | ICD-10-CM | POA: Diagnosis not present

## 2014-10-25 DIAGNOSIS — M79631 Pain in right forearm: Secondary | ICD-10-CM | POA: Diagnosis not present

## 2014-10-29 IMAGING — CR DG CHEST 2V
2 series · 2 of 2 positions shown · non-contrast
Comparison: DG CHEST 2V dated 01/06/2014

CLINICAL DATA: Cough, shortness of breath, weakness. History of
pneumonia. Fall.

EXAM:
CHEST  2 VIEW

[w chest lat]
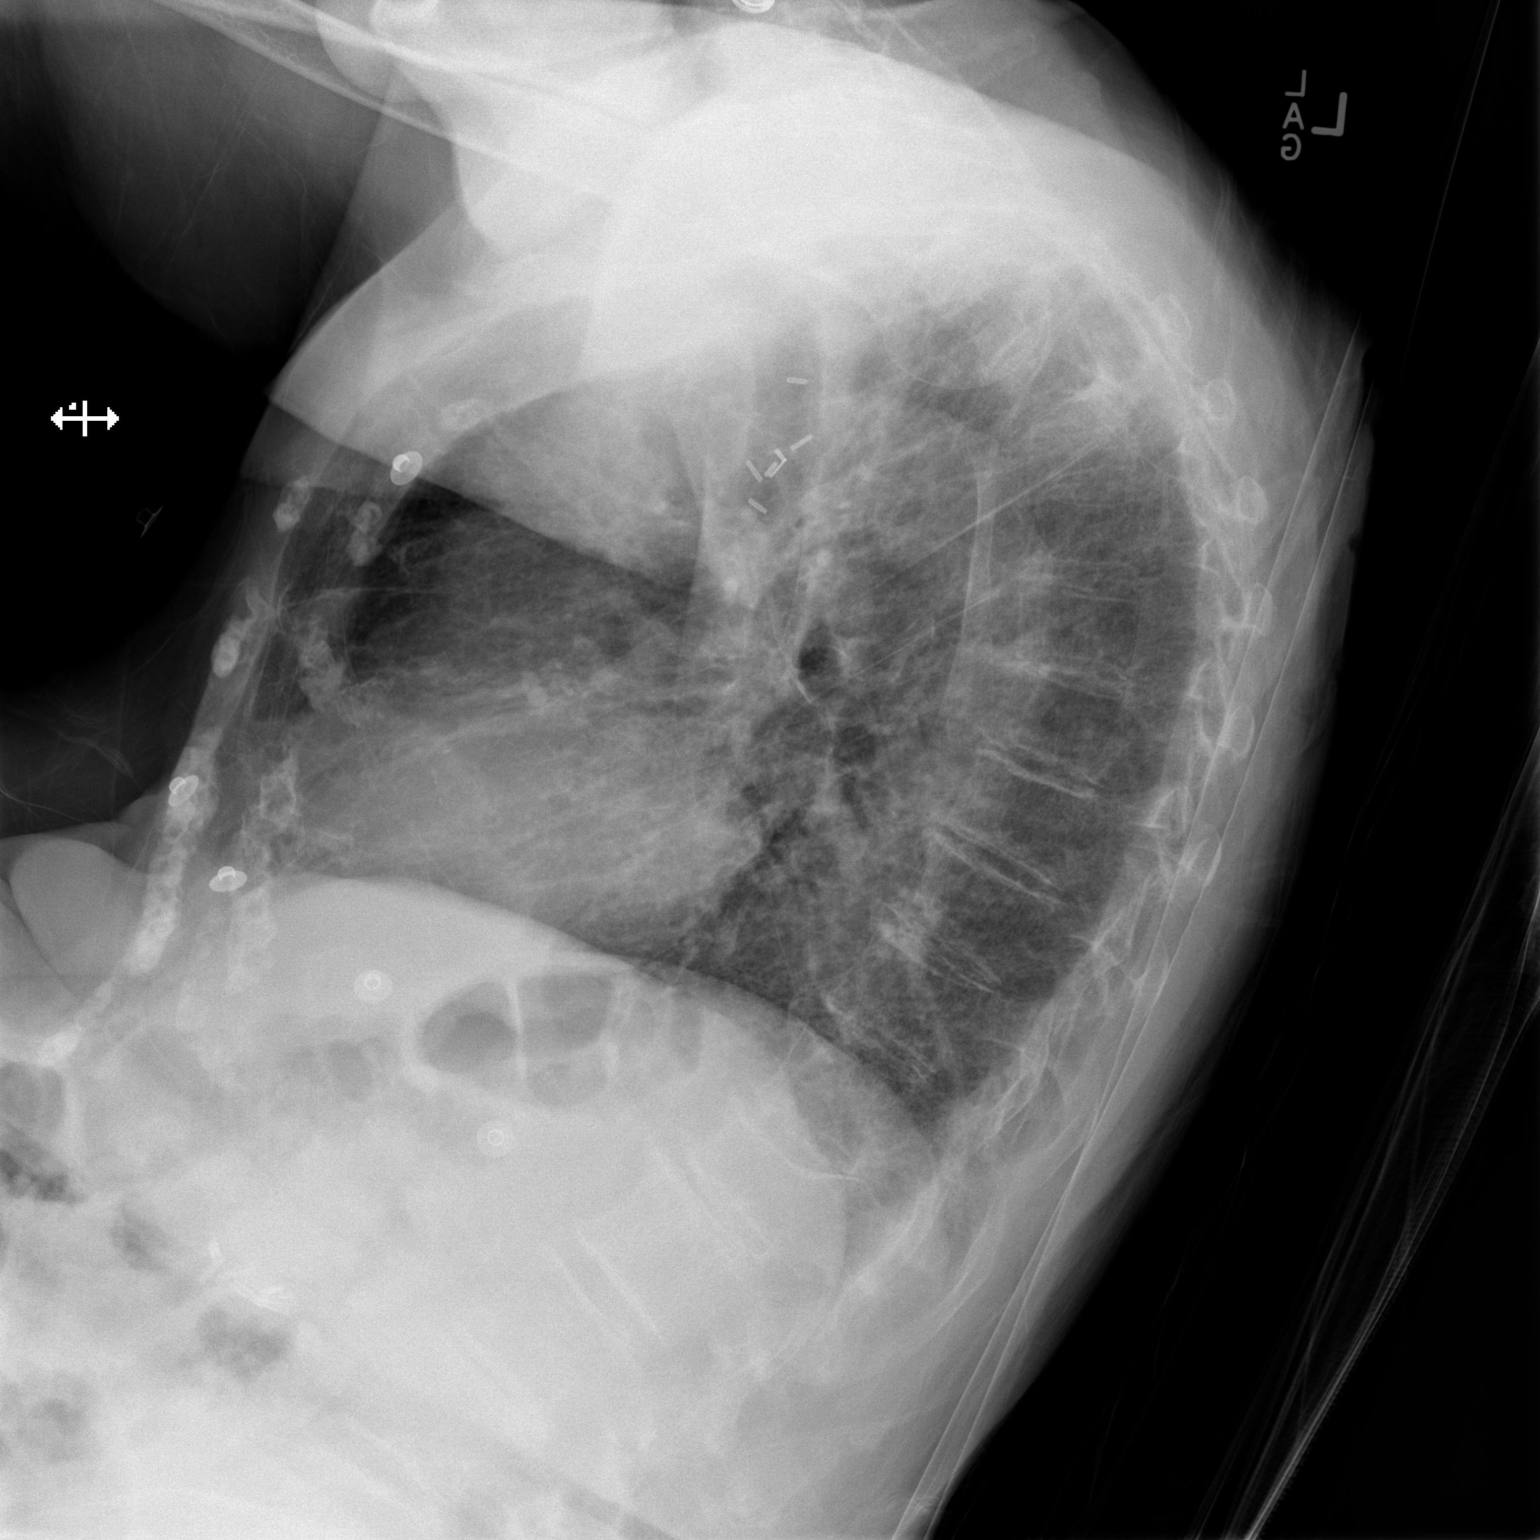

[x chest ap]
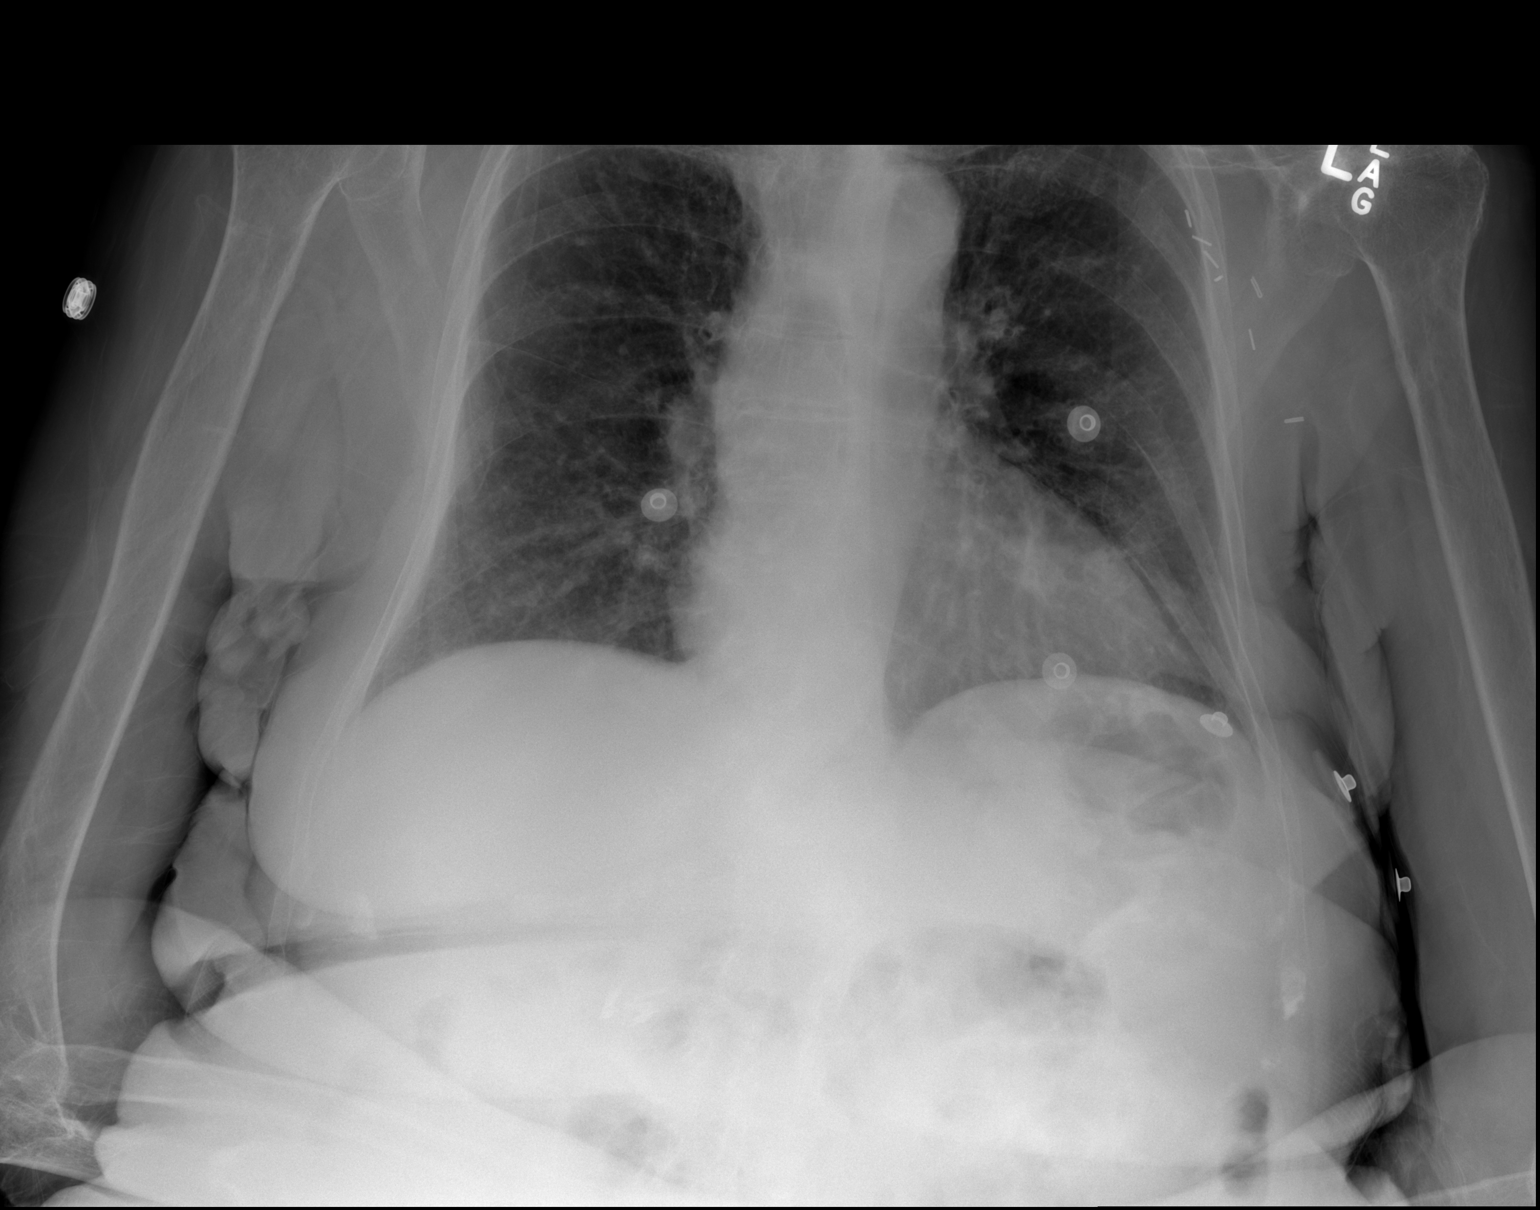

[2 of 2 positions shown; findings below may reference images not displayed]

FINDINGS: Shallow inspiration. The heart size and mediastinal contours are
within normal limits. Both lungs are clear. The visualized skeletal
structures are unremarkable. Surgical clips in the left axilla.
IMPRESSION: No active cardiopulmonary disease.

## 2014-11-10 DIAGNOSIS — M84359A Stress fracture, hip, unspecified, initial encounter for fracture: Secondary | ICD-10-CM | POA: Diagnosis not present

## 2014-11-10 DIAGNOSIS — D509 Iron deficiency anemia, unspecified: Secondary | ICD-10-CM | POA: Diagnosis not present

## 2014-11-10 DIAGNOSIS — F0390 Unspecified dementia without behavioral disturbance: Secondary | ICD-10-CM | POA: Diagnosis not present

## 2014-11-10 DIAGNOSIS — I10 Essential (primary) hypertension: Secondary | ICD-10-CM | POA: Diagnosis not present

## 2014-11-11 DIAGNOSIS — D649 Anemia, unspecified: Secondary | ICD-10-CM | POA: Diagnosis not present

## 2014-12-10 DIAGNOSIS — R319 Hematuria, unspecified: Secondary | ICD-10-CM | POA: Diagnosis not present

## 2014-12-10 DIAGNOSIS — N390 Urinary tract infection, site not specified: Secondary | ICD-10-CM | POA: Diagnosis not present

## 2015-01-11 DIAGNOSIS — I10 Essential (primary) hypertension: Secondary | ICD-10-CM | POA: Diagnosis not present

## 2015-01-11 DIAGNOSIS — D649 Anemia, unspecified: Secondary | ICD-10-CM | POA: Diagnosis not present

## 2015-01-11 DIAGNOSIS — Z79899 Other long term (current) drug therapy: Secondary | ICD-10-CM | POA: Diagnosis not present

## 2015-01-11 DIAGNOSIS — E785 Hyperlipidemia, unspecified: Secondary | ICD-10-CM | POA: Diagnosis not present

## 2015-01-26 DIAGNOSIS — I10 Essential (primary) hypertension: Secondary | ICD-10-CM | POA: Diagnosis not present

## 2015-01-26 DIAGNOSIS — N390 Urinary tract infection, site not specified: Secondary | ICD-10-CM | POA: Diagnosis not present

## 2015-01-26 DIAGNOSIS — Z79899 Other long term (current) drug therapy: Secondary | ICD-10-CM | POA: Diagnosis not present

## 2015-01-26 DIAGNOSIS — R319 Hematuria, unspecified: Secondary | ICD-10-CM | POA: Diagnosis not present

## 2015-01-26 DIAGNOSIS — R509 Fever, unspecified: Secondary | ICD-10-CM | POA: Diagnosis not present

## 2015-01-26 DIAGNOSIS — D649 Anemia, unspecified: Secondary | ICD-10-CM | POA: Diagnosis not present

## 2015-02-01 DIAGNOSIS — D649 Anemia, unspecified: Secondary | ICD-10-CM | POA: Diagnosis not present

## 2015-02-10 DIAGNOSIS — F3341 Major depressive disorder, recurrent, in partial remission: Secondary | ICD-10-CM | POA: Diagnosis not present

## 2015-02-10 DIAGNOSIS — E649 Sequelae of unspecified nutritional deficiency: Secondary | ICD-10-CM | POA: Diagnosis not present

## 2015-02-10 DIAGNOSIS — F0391 Unspecified dementia with behavioral disturbance: Secondary | ICD-10-CM | POA: Diagnosis not present

## 2015-02-10 DIAGNOSIS — I10 Essential (primary) hypertension: Secondary | ICD-10-CM | POA: Diagnosis not present

## 2015-02-10 DIAGNOSIS — M84359D Stress fracture, hip, unspecified, subsequent encounter for fracture with routine healing: Secondary | ICD-10-CM | POA: Diagnosis not present

## 2015-02-15 DIAGNOSIS — F0390 Unspecified dementia without behavioral disturbance: Secondary | ICD-10-CM | POA: Diagnosis not present

## 2015-02-15 DIAGNOSIS — R2689 Other abnormalities of gait and mobility: Secondary | ICD-10-CM | POA: Diagnosis not present

## 2015-02-15 DIAGNOSIS — Z9181 History of falling: Secondary | ICD-10-CM | POA: Diagnosis not present

## 2015-02-17 DIAGNOSIS — Z9181 History of falling: Secondary | ICD-10-CM | POA: Diagnosis not present

## 2015-02-17 DIAGNOSIS — F0390 Unspecified dementia without behavioral disturbance: Secondary | ICD-10-CM | POA: Diagnosis not present

## 2015-02-17 DIAGNOSIS — R2689 Other abnormalities of gait and mobility: Secondary | ICD-10-CM | POA: Diagnosis not present

## 2015-02-20 DIAGNOSIS — Z9181 History of falling: Secondary | ICD-10-CM | POA: Diagnosis not present

## 2015-02-20 DIAGNOSIS — R2689 Other abnormalities of gait and mobility: Secondary | ICD-10-CM | POA: Diagnosis not present

## 2015-02-20 DIAGNOSIS — F0390 Unspecified dementia without behavioral disturbance: Secondary | ICD-10-CM | POA: Diagnosis not present

## 2015-02-22 DIAGNOSIS — R2689 Other abnormalities of gait and mobility: Secondary | ICD-10-CM | POA: Diagnosis not present

## 2015-02-22 DIAGNOSIS — Z9181 History of falling: Secondary | ICD-10-CM | POA: Diagnosis not present

## 2015-02-22 DIAGNOSIS — F0390 Unspecified dementia without behavioral disturbance: Secondary | ICD-10-CM | POA: Diagnosis not present

## 2015-02-24 DIAGNOSIS — Z9181 History of falling: Secondary | ICD-10-CM | POA: Diagnosis not present

## 2015-02-24 DIAGNOSIS — R2689 Other abnormalities of gait and mobility: Secondary | ICD-10-CM | POA: Diagnosis not present

## 2015-02-24 DIAGNOSIS — F0390 Unspecified dementia without behavioral disturbance: Secondary | ICD-10-CM | POA: Diagnosis not present

## 2015-02-26 DIAGNOSIS — F0390 Unspecified dementia without behavioral disturbance: Secondary | ICD-10-CM | POA: Diagnosis not present

## 2015-02-26 DIAGNOSIS — R2689 Other abnormalities of gait and mobility: Secondary | ICD-10-CM | POA: Diagnosis not present

## 2015-02-26 DIAGNOSIS — Z9181 History of falling: Secondary | ICD-10-CM | POA: Diagnosis not present

## 2015-03-01 DIAGNOSIS — F0390 Unspecified dementia without behavioral disturbance: Secondary | ICD-10-CM | POA: Diagnosis not present

## 2015-03-01 DIAGNOSIS — Z9181 History of falling: Secondary | ICD-10-CM | POA: Diagnosis not present

## 2015-03-01 DIAGNOSIS — J181 Lobar pneumonia, unspecified organism: Secondary | ICD-10-CM | POA: Diagnosis not present

## 2015-03-01 DIAGNOSIS — R2689 Other abnormalities of gait and mobility: Secondary | ICD-10-CM | POA: Diagnosis not present

## 2015-03-03 DIAGNOSIS — J181 Lobar pneumonia, unspecified organism: Secondary | ICD-10-CM | POA: Diagnosis not present

## 2015-03-03 DIAGNOSIS — R2689 Other abnormalities of gait and mobility: Secondary | ICD-10-CM | POA: Diagnosis not present

## 2015-03-03 DIAGNOSIS — Z9181 History of falling: Secondary | ICD-10-CM | POA: Diagnosis not present

## 2015-03-03 DIAGNOSIS — F0390 Unspecified dementia without behavioral disturbance: Secondary | ICD-10-CM | POA: Diagnosis not present

## 2015-03-08 DIAGNOSIS — R2689 Other abnormalities of gait and mobility: Secondary | ICD-10-CM | POA: Diagnosis not present

## 2015-03-08 DIAGNOSIS — Z9181 History of falling: Secondary | ICD-10-CM | POA: Diagnosis not present

## 2015-03-08 DIAGNOSIS — J181 Lobar pneumonia, unspecified organism: Secondary | ICD-10-CM | POA: Diagnosis not present

## 2015-03-08 DIAGNOSIS — F0390 Unspecified dementia without behavioral disturbance: Secondary | ICD-10-CM | POA: Diagnosis not present

## 2015-03-10 DIAGNOSIS — Z9181 History of falling: Secondary | ICD-10-CM | POA: Diagnosis not present

## 2015-03-10 DIAGNOSIS — R2689 Other abnormalities of gait and mobility: Secondary | ICD-10-CM | POA: Diagnosis not present

## 2015-03-10 DIAGNOSIS — J181 Lobar pneumonia, unspecified organism: Secondary | ICD-10-CM | POA: Diagnosis not present

## 2015-03-10 DIAGNOSIS — F0390 Unspecified dementia without behavioral disturbance: Secondary | ICD-10-CM | POA: Diagnosis not present

## 2015-03-13 DIAGNOSIS — R2689 Other abnormalities of gait and mobility: Secondary | ICD-10-CM | POA: Diagnosis not present

## 2015-03-13 DIAGNOSIS — F0390 Unspecified dementia without behavioral disturbance: Secondary | ICD-10-CM | POA: Diagnosis not present

## 2015-03-13 DIAGNOSIS — R0989 Other specified symptoms and signs involving the circulatory and respiratory systems: Secondary | ICD-10-CM | POA: Diagnosis not present

## 2015-03-13 DIAGNOSIS — Z9181 History of falling: Secondary | ICD-10-CM | POA: Diagnosis not present

## 2015-03-13 DIAGNOSIS — J181 Lobar pneumonia, unspecified organism: Secondary | ICD-10-CM | POA: Diagnosis not present

## 2015-03-14 ENCOUNTER — Other Ambulatory Visit (HOSPITAL_COMMUNITY): Payer: Self-pay

## 2015-03-14 ENCOUNTER — Encounter (HOSPITAL_COMMUNITY): Payer: Self-pay

## 2015-03-14 ENCOUNTER — Emergency Department (HOSPITAL_COMMUNITY): Payer: Medicare Other

## 2015-03-14 ENCOUNTER — Inpatient Hospital Stay (HOSPITAL_COMMUNITY)
Admission: EM | Admit: 2015-03-14 | Discharge: 2015-03-19 | DRG: 871 | Disposition: A | Discharge status: Skilled Nursing Facility | Payer: Medicare Other | Attending: Internal Medicine | Admitting: Internal Medicine

## 2015-03-14 DIAGNOSIS — Y95 Nosocomial condition: Secondary | ICD-10-CM | POA: Diagnosis present

## 2015-03-14 DIAGNOSIS — I10 Essential (primary) hypertension: Secondary | ICD-10-CM | POA: Diagnosis not present

## 2015-03-14 DIAGNOSIS — E876 Hypokalemia: Secondary | ICD-10-CM | POA: Diagnosis present

## 2015-03-14 DIAGNOSIS — Z79899 Other long term (current) drug therapy: Secondary | ICD-10-CM

## 2015-03-14 DIAGNOSIS — Z7982 Long term (current) use of aspirin: Secondary | ICD-10-CM | POA: Diagnosis not present

## 2015-03-14 DIAGNOSIS — R05 Cough: Secondary | ICD-10-CM

## 2015-03-14 DIAGNOSIS — Z853 Personal history of malignant neoplasm of breast: Secondary | ICD-10-CM

## 2015-03-14 DIAGNOSIS — F419 Anxiety disorder, unspecified: Secondary | ICD-10-CM | POA: Diagnosis present

## 2015-03-14 DIAGNOSIS — Z66 Do not resuscitate: Secondary | ICD-10-CM | POA: Diagnosis present

## 2015-03-14 DIAGNOSIS — R059 Cough, unspecified: Secondary | ICD-10-CM

## 2015-03-14 DIAGNOSIS — F329 Major depressive disorder, single episode, unspecified: Secondary | ICD-10-CM | POA: Diagnosis present

## 2015-03-14 DIAGNOSIS — R6520 Severe sepsis without septic shock: Secondary | ICD-10-CM | POA: Diagnosis not present

## 2015-03-14 DIAGNOSIS — J189 Pneumonia, unspecified organism: Secondary | ICD-10-CM | POA: Diagnosis not present

## 2015-03-14 DIAGNOSIS — F0390 Unspecified dementia without behavioral disturbance: Secondary | ICD-10-CM | POA: Diagnosis present

## 2015-03-14 DIAGNOSIS — Z9181 History of falling: Secondary | ICD-10-CM | POA: Diagnosis not present

## 2015-03-14 DIAGNOSIS — D649 Anemia, unspecified: Secondary | ICD-10-CM | POA: Diagnosis present

## 2015-03-14 DIAGNOSIS — R0602 Shortness of breath: Secondary | ICD-10-CM | POA: Diagnosis not present

## 2015-03-14 DIAGNOSIS — A419 Sepsis, unspecified organism: Secondary | ICD-10-CM | POA: Diagnosis not present

## 2015-03-14 DIAGNOSIS — D72829 Elevated white blood cell count, unspecified: Secondary | ICD-10-CM | POA: Diagnosis not present

## 2015-03-14 DIAGNOSIS — M6281 Muscle weakness (generalized): Secondary | ICD-10-CM | POA: Diagnosis not present

## 2015-03-14 DIAGNOSIS — I6789 Other cerebrovascular disease: Secondary | ICD-10-CM | POA: Diagnosis not present

## 2015-03-14 DIAGNOSIS — F4489 Other dissociative and conversion disorders: Secondary | ICD-10-CM | POA: Diagnosis not present

## 2015-03-14 LAB — COMPREHENSIVE METABOLIC PANEL
ALK PHOS: 119 U/L — AB (ref 39–117)
ALT: 73 U/L — AB (ref 0–35)
AST: 97 U/L — ABNORMAL HIGH (ref 0–37)
Albumin: 2.7 g/dL — ABNORMAL LOW (ref 3.5–5.2)
Anion gap: 11 (ref 5–15)
BUN: 26 mg/dL — ABNORMAL HIGH (ref 6–23)
CHLORIDE: 106 mmol/L (ref 96–112)
CO2: 23 mmol/L (ref 19–32)
Calcium: 8.5 mg/dL (ref 8.4–10.5)
Creatinine, Ser: 0.68 mg/dL (ref 0.50–1.10)
GFR calc non Af Amer: 72 mL/min — ABNORMAL LOW (ref >90)
GFR, EST AFRICAN AMERICAN: 84 mL/min — AB (ref >90)
GLUCOSE: 136 mg/dL — AB (ref 70–99)
Potassium: 3.4 mmol/L — ABNORMAL LOW (ref 3.5–5.1)
Sodium: 140 mmol/L (ref 135–145)
Total Bilirubin: 0.8 mg/dL (ref 0.3–1.2)
Total Protein: 7 g/dL (ref 6.0–8.3)

## 2015-03-14 LAB — RETICULOCYTES
RBC.: 2.96 MIL/uL — ABNORMAL LOW (ref 3.87–5.11)
RETIC CT PCT: 0.8 % (ref 0.4–3.1)
Retic Count, Absolute: 23.7 10*3/uL (ref 19.0–186.0)

## 2015-03-14 LAB — CBC WITH DIFFERENTIAL/PLATELET
BASOS PCT: 0 % (ref 0–1)
Basophils Absolute: 0 10*3/uL (ref 0.0–0.1)
EOS ABS: 0 10*3/uL (ref 0.0–0.7)
Eosinophils Relative: 0 % (ref 0–5)
HEMATOCRIT: 27.7 % — AB (ref 36.0–46.0)
HEMOGLOBIN: 8.6 g/dL — AB (ref 12.0–15.0)
LYMPHS ABS: 0.9 10*3/uL (ref 0.7–4.0)
Lymphocytes Relative: 7 % — ABNORMAL LOW (ref 12–46)
MCH: 27.9 pg (ref 26.0–34.0)
MCHC: 31 g/dL (ref 30.0–36.0)
MCV: 89.9 fL (ref 78.0–100.0)
MONO ABS: 0.8 10*3/uL (ref 0.1–1.0)
Monocytes Relative: 6 % (ref 3–12)
NEUTROS ABS: 11.2 10*3/uL — AB (ref 1.7–7.7)
Neutrophils Relative %: 87 % — ABNORMAL HIGH (ref 43–77)
Platelets: 197 10*3/uL (ref 150–400)
RBC: 3.08 MIL/uL — ABNORMAL LOW (ref 3.87–5.11)
RDW: 15.9 % — ABNORMAL HIGH (ref 11.5–15.5)
WBC: 12.9 10*3/uL — AB (ref 4.0–10.5)

## 2015-03-14 LAB — FOLATE: Folate: >20 ng/mL

## 2015-03-14 LAB — VITAMIN B12: Vitamin B-12: 670 pg/mL (ref 211–911)

## 2015-03-14 LAB — IRON AND TIBC
IRON: 12 ug/dL — AB (ref 42–145)
Saturation Ratios: 9 % — ABNORMAL LOW (ref 20–55)
TIBC: 134 ug/dL — AB (ref 250–470)
UIBC: 122 ug/dL — ABNORMAL LOW (ref 125–400)

## 2015-03-14 LAB — INFLUENZA PANEL BY PCR (TYPE A & B)
H1N1 flu by pcr: NOT DETECTED
INFLBPCR: NEGATIVE
Influenza A By PCR: NEGATIVE

## 2015-03-14 LAB — I-STAT TROPONIN, ED: Troponin i, poc: 0.03 ng/mL (ref 0.00–0.08)

## 2015-03-14 LAB — FERRITIN: FERRITIN: 293 ng/mL — AB (ref 10–291)

## 2015-03-14 LAB — I-STAT CG4 LACTIC ACID, ED: Lactic Acid, Venous: 1.41 mmol/L (ref 0.5–2.0)

## 2015-03-14 MED ORDER — SODIUM CHLORIDE 0.9 % IV BOLUS (SEPSIS)
500.0000 mL | Freq: Once | INTRAVENOUS | Status: AC
  Administered 2015-03-14: 500 mL via INTRAVENOUS

## 2015-03-14 MED ORDER — MIRTAZAPINE 7.5 MG PO TABS
7.5000 mg | ORAL_TABLET | Freq: Every day | ORAL | Status: DC
  Administered 2015-03-14 – 2015-03-18 (×5): 7.5 mg via ORAL
  Filled 2015-03-14 (×6): qty 1

## 2015-03-14 MED ORDER — POLYETHYLENE GLYCOL 3350 17 G PO PACK
17.0000 g | PACK | Freq: Every day | ORAL | Status: DC
  Administered 2015-03-18 – 2015-03-19 (×2): 17 g via ORAL
  Filled 2015-03-14 (×5): qty 1

## 2015-03-14 MED ORDER — VITAMIN D3 25 MCG (1000 UNIT) PO TABS
2000.0000 [IU] | ORAL_TABLET | Freq: Every day | ORAL | Status: DC
  Administered 2015-03-15 – 2015-03-19 (×3): 2000 [IU] via ORAL
  Filled 2015-03-14 (×5): qty 2

## 2015-03-14 MED ORDER — VANCOMYCIN HCL IN DEXTROSE 1-5 GM/200ML-% IV SOLN
1000.0000 mg | Freq: Once | INTRAVENOUS | Status: AC
  Administered 2015-03-14: 1000 mg via INTRAVENOUS
  Filled 2015-03-14: qty 200

## 2015-03-14 MED ORDER — POLYETHYLENE GLYCOL 3350 17 GM/SCOOP PO POWD
17.0000 g | Freq: Every day | ORAL | Status: DC
  Filled 2015-03-14: qty 255

## 2015-03-14 MED ORDER — SODIUM CHLORIDE 0.9 % IV SOLN
INTRAVENOUS | Status: AC
  Administered 2015-03-14: 13:00:00 via INTRAVENOUS

## 2015-03-14 MED ORDER — AMLODIPINE BESYLATE 5 MG PO TABS
5.0000 mg | ORAL_TABLET | Freq: Every day | ORAL | Status: DC
Start: 2015-03-15 — End: 2015-03-19
  Administered 2015-03-19: 5 mg via ORAL
  Filled 2015-03-14 (×5): qty 1

## 2015-03-14 MED ORDER — VENLAFAXINE HCL ER 75 MG PO CP24
75.0000 mg | ORAL_CAPSULE | Freq: Every day | ORAL | Status: DC
  Administered 2015-03-15 – 2015-03-19 (×5): 75 mg via ORAL
  Filled 2015-03-14 (×7): qty 1

## 2015-03-14 MED ORDER — CHLORHEXIDINE GLUCONATE 0.12 % MT SOLN
15.0000 mL | Freq: Two times a day (BID) | OROMUCOSAL | Status: DC
  Administered 2015-03-14 – 2015-03-18 (×9): 15 mL via OROMUCOSAL
  Filled 2015-03-14 (×11): qty 15

## 2015-03-14 MED ORDER — VANCOMYCIN HCL IN DEXTROSE 750-5 MG/150ML-% IV SOLN
750.0000 mg | INTRAVENOUS | Status: DC
  Administered 2015-03-15 – 2015-03-19 (×5): 750 mg via INTRAVENOUS
  Filled 2015-03-14 (×5): qty 150

## 2015-03-14 MED ORDER — DM-GUAIFENESIN ER 30-600 MG PO TB12
1.0000 | ORAL_TABLET | Freq: Two times a day (BID) | ORAL | Status: DC
  Administered 2015-03-15 – 2015-03-18 (×7): 1 via ORAL
  Filled 2015-03-14 (×11): qty 1

## 2015-03-14 MED ORDER — HYDROCODONE-ACETAMINOPHEN 5-325 MG PO TABS: 1.0000 | ORAL_TABLET | Freq: Four times a day (QID) | ORAL | Status: DC | PRN

## 2015-03-14 MED ORDER — LORAZEPAM 0.5 MG PO TABS: 0.2500 mg | ORAL_TABLET | Freq: Three times a day (TID) | ORAL | Status: DC | PRN

## 2015-03-14 MED ORDER — POTASSIUM CHLORIDE IN NACL 20-0.9 MEQ/L-% IV SOLN
INTRAVENOUS | Status: DC
  Administered 2015-03-14: 18:00:00 via INTRAVENOUS
  Filled 2015-03-14 (×2): qty 1000

## 2015-03-14 MED ORDER — IPRATROPIUM-ALBUTEROL 0.5-2.5 (3) MG/3ML IN SOLN
3.0000 mL | RESPIRATORY_TRACT | Status: DC | PRN
  Administered 2015-03-14: 3 mL via RESPIRATORY_TRACT
  Filled 2015-03-14: qty 3

## 2015-03-14 MED ORDER — RISPERIDONE 0.25 MG PO TABS
0.2500 mg | ORAL_TABLET | Freq: Every day | ORAL | Status: DC
  Administered 2015-03-14 – 2015-03-18 (×5): 0.25 mg via ORAL
  Filled 2015-03-14 (×6): qty 1

## 2015-03-14 MED ORDER — DOCUSATE SODIUM 100 MG PO CAPS
100.0000 mg | ORAL_CAPSULE | Freq: Two times a day (BID) | ORAL | Status: DC
  Administered 2015-03-14 – 2015-03-19 (×9): 100 mg via ORAL
  Filled 2015-03-14 (×11): qty 1

## 2015-03-14 MED ORDER — ASPIRIN EC 325 MG PO TBEC
325.0000 mg | DELAYED_RELEASE_TABLET | Freq: Two times a day (BID) | ORAL | Status: DC
Start: 2015-03-14 — End: 2015-03-19
  Administered 2015-03-14 – 2015-03-19 (×9): 325 mg via ORAL
  Filled 2015-03-14 (×11): qty 1

## 2015-03-14 MED ORDER — IPRATROPIUM-ALBUTEROL 0.5-2.5 (3) MG/3ML IN SOLN
3.0000 mL | Freq: Once | RESPIRATORY_TRACT | Status: AC
  Administered 2015-03-14: 3 mL via RESPIRATORY_TRACT
  Filled 2015-03-14: qty 3

## 2015-03-14 MED ORDER — PIPERACILLIN-TAZOBACTAM 3.375 G IVPB
3.3750 g | Freq: Three times a day (TID) | INTRAVENOUS | Status: DC
  Administered 2015-03-14 – 2015-03-19 (×15): 3.375 g via INTRAVENOUS
  Filled 2015-03-14 (×17): qty 50

## 2015-03-14 MED ORDER — HYDROCOD POLST-CHLORPHEN POLST 10-8 MG/5ML PO LQCR
5.0000 mL | Freq: Once | ORAL | Status: AC
  Administered 2015-03-14: 5 mL via ORAL
  Filled 2015-03-14: qty 5

## 2015-03-14 MED ORDER — PIPERACILLIN-TAZOBACTAM 3.375 G IVPB 30 MIN
3.3750 g | Freq: Once | INTRAVENOUS | Status: AC
  Administered 2015-03-14: 3.375 g via INTRAVENOUS
  Filled 2015-03-14: qty 50

## 2015-03-14 NOTE — Progress Notes (Signed)
Utilization Review completed.  Aron Inge RN CM  

## 2015-03-14 NOTE — ED Provider Notes (Signed)
CSN: [id number]     Arrival date & time 03/14/15  0910 History   First MD Initiated Contact with Patient 03/14/15 0915     Chief Complaint  Patient presents with  . Shortness of Breath     (Consider location/radiation/quality/duration/timing/severity/associated sxs/prior Treatment) The history is provided by the patient, the EMS personnel, the nursing home and a relative.  Bethany Fox is a 81 y.o. female hx of dementia, here with cough and shortness of breath. Patient from [facility]. Daughter has been visiting her daily and noticed that she is more short of breath for the last 3 days. Daughter also states that she felt warm at the facility. Had cxr yesterday at the facility that was normal per daughter. Patient DNR    Past Medical History  Diagnosis Date  . Gastric ulcer   . Cancer     breast  . Depression   . Dementia   . Breast cancer   . H/O mastectomy     Left side    Past Surgical History  Procedure Laterality Date  . Cholecystectomy    . Mastectomy    . Orif hip fracture Right 07/01/2014    Procedure: OPEN REDUCTION INTERNAL FIXATION HIP;  Surgeon: Mauri Pole, MD;  Location: [facility];  Service: Orthopedics;  Laterality: Right;   History reviewed. No pertinent family history. History  Substance Use Topics  . Smoking status: Never Smoker   . Smokeless tobacco: Not on file  . Alcohol Use: No   OB History    No data available     Review of Systems  Unable to perform ROS: Dementia  Respiratory: Positive for shortness of breath.       Allergies  Adhesive and Ciprofloxacin  Home Medications   Prior to Admission medications   Medication Sig Start Date End Date Taking? Authorizing Provider  acetaminophen (TYLENOL) 500 MG tablet Take 500 mg by mouth every 6 (six) hours as needed for fever (or for pain).    Yes Historical Provider, MD  amLODipine (NORVASC) 5 MG tablet Take 1 tablet (5 mg total) by mouth daily. 07/04/14  Yes Eugenie Filler, MD   aspirin 325 MG EC tablet Take 325 mg by mouth 2 (two) times daily.   Yes Historical Provider, MD  chlorhexidine (PERIDEX) 0.12 % solution Use as directed 15 mLs in the mouth or throat 2 (two) times daily.   Yes Historical Provider, MD  cholecalciferol (VITAMIN D) 1000 UNITS tablet Take 2,000 Units by mouth daily.   Yes Historical Provider, MD  docusate sodium (COLACE) 100 MG capsule Take 100 mg by mouth 2 (two) times daily.   Yes Historical Provider, MD  HYDROcodone-acetaminophen (NORCO/VICODIN) 5-325 MG per tablet Take 1-2 tablets by mouth every 6 (six) hours as needed for moderate pain. 07/02/14  Yes Matthew Babish, PA-C  LORazepam (ATIVAN) 0.5 MG tablet Take 0.25 mg by mouth every 8 (eight) hours as needed for anxiety.   Yes Historical Provider, MD  mirtazapine (REMERON) 7.5 MG tablet Take 7.5 mg by mouth at bedtime.   Yes Historical Provider, MD  polyethylene glycol powder (GLYCOLAX/MIRALAX) powder Take 17 g by mouth daily. 12/18/14  Yes Historical Provider, MD  risperiDONE (RISPERDAL) 0.25 MG tablet Take 0.25 mg by mouth at bedtime.   Yes Historical Provider, MD  venlafaxine XR (EFFEXOR-XR) 75 MG 24 hr capsule Take 75 mg by mouth daily with breakfast.   Yes Historical Provider, MD   BP 152/54 mmHg  Pulse 89  Temp(Src) 99.4 F (37.4 C) (Rectal)  Resp 19  SpO2 95% Physical Exam  Constitutional:  Demented, chronically ill   HENT:  Head: Normocephalic.  Mouth/Throat: Oropharynx is clear and moist.  Eyes: Conjunctivae are normal. Pupils are equal, round, and reactive to light.  Neck: Normal range of motion. Neck supple.  Cardiovascular: Normal rate, regular rhythm and normal heart sounds.   Pulmonary/Chest:  Wheezing throughout, mild bilateral crackles   Abdominal: Bowel sounds are normal. She exhibits no distension. There is no tenderness. There is no rebound.  Musculoskeletal: Normal range of motion. She exhibits no edema or tenderness.  Neurological: She is alert.  Demented, moving  all extremities   Skin: Skin is warm and dry.  Psychiatric:  Unable   Nursing note and vitals reviewed.   ED Course  Procedures (including critical care time)  Angiocath insertion Performed by: Darl Householder, Kashvi Prevette  Consent: Verbal consent obtained. Risks and benefits: risks, benefits and alternatives were discussed Time out: Immediately prior to procedure a "time out" was called to verify the correct patient, procedure, equipment, support staff and site/side marked as required.  Preparation: Patient was prepped and draped in the usual sterile fashion.  Vein Location: R cephalic  Ultrasound Guided  Gauge: 20   Normal blood return and flush without difficulty Patient tolerance: Patient tolerated the procedure well with no immediate complications.     Labs Review Labs Reviewed  CBC WITH DIFFERENTIAL/PLATELET - Abnormal; Notable for the following:    WBC 12.9 (*)    RBC 3.08 (*)    Hemoglobin 8.6 (*)    HCT 27.7 (*)    RDW 15.9 (*)    Neutrophils Relative % 87 (*)    Lymphocytes Relative 7 (*)    Neutro Abs 11.2 (*)    All other components within normal limits  COMPREHENSIVE METABOLIC PANEL - Abnormal; Notable for the following:    Potassium 3.4 (*)    Glucose, Bld 136 (*)    BUN 26 (*)    Albumin 2.7 (*)    AST 97 (*)    ALT 73 (*)    Alkaline Phosphatase 119 (*)    GFR calc non Af Amer 72 (*)    GFR calc Af Amer 84 (*)    All other components within normal limits  CULTURE, BLOOD (ROUTINE X 2)  CULTURE, BLOOD (ROUTINE X 2)  I-STAT TROPOININ, ED  I-STAT CG4 LACTIC ACID, ED    Imaging Review Dg Chest Port 1 View  03/14/2015   CLINICAL DATA:  Cough  EXAM: PORTABLE CHEST - 1 VIEW  COMPARISON:  07/02/2014  FINDINGS: Cardiac shadow is stable. Patchy infiltrates are identified throughout both lungs. No sizable effusion is seen no pneumothorax is noted. The bony structures are within normal limits.  IMPRESSION: Bilateral patchy infiltrates worse on the right than the left.  Followup films to resolution are recommended.   Electronically Signed   By: Inez Catalina M.D.   On: 03/14/2015 09:42     EKG Interpretation None      MDM   Final diagnoses:  Cough    Bethany Fox is a 81 y.o. female here with cough, SOB. Consider pneumonia vs bronchitis. Rectal temp 99. Will do sepsis workup.   11:48 AM CXR showed bilateral pneumonia. WBC 13. Given vanc/zosyn for HCAP. Will admit.    Wandra Arthurs, MD 03/14/15 [phone]

## 2015-03-14 NOTE — ED Notes (Signed)
MD at bedside. 

## 2015-03-14 NOTE — ED Notes (Signed)
Per EMS, pt from [facility].  Pt had portable cxr at facility and it states lung clear.  However, pt has been short of breath with possible fever x 3 days.  Pt with baseline dementia and oriented to baseline.  DTR at bedside.  Vitals: 122/50, hr 88, resp 16, sats 98% ra.  cbg 190

## 2015-03-14 NOTE — Progress Notes (Signed)
ANTIBIOTIC CONSULT NOTE - INITIAL  Pharmacy Consult for Vanc, Zosyn, and renal-adjustment of antibiotics Indication: HCAP  Allergies  Allergen Reactions  . Adhesive [Tape]     Unknown   . Ciprofloxacin Rash    Patient Measurements:   Adjusted Body Weight:   Vital Signs: Temp: 98.4 F (36.9 C) (03/15 1330) Temp Source: Oral (03/15 1330) BP: 142/51 mmHg (03/15 1357) Pulse Rate: 85 (03/15 1357) Intake/Output from previous day:   Intake/Output from this shift:    Labs:  Recent Labs  03/14/15 0957  WBC 12.9*  HGB 8.6*  PLT 197  CREATININE 0.68   CrCl cannot be calculated (Unknown ideal weight.). No results for input(s): VANCOTROUGH, VANCOPEAK, VANCORANDOM, GENTTROUGH, GENTPEAK, GENTRANDOM, TOBRATROUGH, TOBRAPEAK, TOBRARND, AMIKACINPEAK, AMIKACINTROU, AMIKACIN in the last 72 hours.   Microbiology: No results found for this or any previous visit (from the past 720 hour(s)).  Medical History: Past Medical History  Diagnosis Date  . Gastric ulcer   . Cancer     breast  . Depression   . Dementia   . Breast cancer   . H/O mastectomy     Left side     Medications:  Anti-infectives    Start     Dose/Rate Route Frequency Ordered Stop   03/14/15 1800  piperacillin-tazobactam (ZOSYN) IVPB 3.375 g     3.375 g 12.5 mL/hr over 240 Minutes Intravenous Every 8 hours 03/14/15 1408     03/14/15 1100  vancomycin (VANCOCIN) IVPB 1000 mg/200 mL premix     1,000 mg 200 mL/hr over 60 Minutes Intravenous  Once 03/14/15 1058     03/14/15 1100  piperacillin-tazobactam (ZOSYN) IVPB 3.375 g     3.375 g 100 mL/hr over 30 Minutes Intravenous  Once 03/14/15 1058 03/14/15 1319     Assessment: 81yo F from a NH w/ SOB and fever. CXR shows bilateral pneumonia. Pharmacy is asked to dose Vanc and Zosyn for HCAP. First doses given in the ED.  3/15 >> Zosyn >> 3/15 >> Vanc >>    Tmax: AF WBCs: slightly elevated Renal: SCr wnl, CrCl 55N  Goal of Therapy:  Vancomycin trough level  15-20 mcg/ml  Appropriate antibiotic dosing for renal function; eradication of infection  Plan:  Zosyn 3.375g IV Q8H infused over 4hrs. Order further Vanc once height and weight is updated. Measure Vanc trough at steady state. Follow up renal fxn, culture results, and clinical course.  Romeo Rabon, PharmD, pager [phone]. 03/14/2015,2:12 PM.

## 2015-03-14 NOTE — H&P (Signed)
[facility] History and Physical  Bethany HERRIG STM:[id number] DOB: 1917-09-17 DOA: 03/14/2015  Referring physician: Dr. Shirlyn Goltz PCP: Odette Fraction, MD  Specialists:   Chief Complaint: Shortness of breath and fever  HPI: Bethany Fox is a 81 y.o. female  With a history of breast cancer, dementia, hypertension, depression and anxiety that presented to the emergency department with complaints of shortness of breath and fever. Per patient's daughter, over the past several days, patient has had a fever at the nursing home as well as shortness of breath and sounded "gurgly". Per patient's daughter, chest x-ray was conducted yesterday evening however showed no pneumonia at that time. Patient started insisted that she come to the hospital. In the emergency department, patient's chest x-ray did show bilateral pneumonia. Patient was placed on vancomycin and Zosyn. She was also noted to have slight tachycardia with leukocytosis. [facility] was called for admission.  Review of Systems:  Unobtainable from patient due to dementia.  Past Medical History  Diagnosis Date  . Gastric ulcer   . Cancer     breast  . Depression   . Dementia   . Breast cancer   . H/O mastectomy     Left side    Past Surgical History  Procedure Laterality Date  . Cholecystectomy    . Mastectomy    . Orif hip fracture Right 07/01/2014    Procedure: OPEN REDUCTION INTERNAL FIXATION HIP;  Surgeon: Mauri Pole, MD;  Location: [facility];  Service: Orthopedics;  Laterality: Right;   Social History:  From [facility].  Allergies  Allergen Reactions  . Adhesive [Tape]     Unknown   . Ciprofloxacin Rash    Family history- Reviewed with daughter Daughter has cancer  Prior to Admission medications   Medication Sig Start Date End Date Taking? Authorizing Provider  acetaminophen (TYLENOL) 500 MG tablet Take 500 mg by mouth every 6 (six) hours as needed for fever (or for pain).    Yes  Historical Provider, MD  amLODipine (NORVASC) 5 MG tablet Take 1 tablet (5 mg total) by mouth daily. 07/04/14  Yes Eugenie Filler, MD  aspirin 325 MG EC tablet Take 325 mg by mouth 2 (two) times daily.   Yes Historical Provider, MD  chlorhexidine (PERIDEX) 0.12 % solution Use as directed 15 mLs in the mouth or throat 2 (two) times daily.   Yes Historical Provider, MD  cholecalciferol (VITAMIN D) 1000 UNITS tablet Take 2,000 Units by mouth daily.   Yes Historical Provider, MD  docusate sodium (COLACE) 100 MG capsule Take 100 mg by mouth 2 (two) times daily.   Yes Historical Provider, MD  HYDROcodone-acetaminophen (NORCO/VICODIN) 5-325 MG per tablet Take 1-2 tablets by mouth every 6 (six) hours as needed for moderate pain. 07/02/14  Yes Matthew Babish, PA-C  LORazepam (ATIVAN) 0.5 MG tablet Take 0.25 mg by mouth every 8 (eight) hours as needed for anxiety.   Yes Historical Provider, MD  mirtazapine (REMERON) 7.5 MG tablet Take 7.5 mg by mouth at bedtime.   Yes Historical Provider, MD  polyethylene glycol powder (GLYCOLAX/MIRALAX) powder Take 17 g by mouth daily. 12/18/14  Yes Historical Provider, MD  risperiDONE (RISPERDAL) 0.25 MG tablet Take 0.25 mg by mouth at bedtime.   Yes Historical Provider, MD  venlafaxine XR (EFFEXOR-XR) 75 MG 24 hr capsule Take 75 mg by mouth daily with breakfast.   Yes Historical Provider, MD   Physical Exam: Filed Vitals:   03/14/15 1131  BP: 152/54  Pulse: 89  Temp:   Resp: 19     General: Well developed, NAD, appears stated age  HEENT: NCAT, PERRLA, EOMI, Anicteic Sclera, mucous membranes dry  Neck: Supple, no JVD, no masses  Cardiovascular: S1 S2 auscultated, 2/6 SEM, Regular  Respiratory: Diminished breath sounds, no wheezing appreciated  Abdomen: Soft, nontender, nondistended, + bowel sounds  Extremities: warm dry without cyanosis clubbing or edema  Neuro: Awake, oriented to only self.  Has history of dementia, does not follow commands or answer  questions appropriate.  Skin: Without rashes exudates or nodules  Labs on Admission:  Basic Metabolic Panel:  Recent Labs Lab 03/14/15 0957  NA 140  K 3.4*  CL 106  CO2 23  GLUCOSE 136*  BUN 26*  CREATININE 0.68  CALCIUM 8.5   Liver Function Tests:  Recent Labs Lab 03/14/15 0957  AST 97*  ALT 73*  ALKPHOS 119*  BILITOT 0.8  PROT 7.0  ALBUMIN 2.7*   No results for input(s): LIPASE, AMYLASE in the last 168 hours. No results for input(s): AMMONIA in the last 168 hours. CBC:  Recent Labs Lab 03/14/15 0957  WBC 12.9*  NEUTROABS 11.2*  HGB 8.6*  HCT 27.7*  MCV 89.9  PLT 197   Cardiac Enzymes: No results for input(s): CKTOTAL, CKMB, CKMBINDEX, TROPONINI in the last 168 hours.  BNP (last 3 results) No results for input(s): BNP in the last 8760 hours.  ProBNP (last 3 results)  Recent Labs  07/02/14 0455  PROBNP 1610.0*    CBG: No results for input(s): GLUCAP in the last 168 hours.  Radiological Exams on Admission: Dg Chest Port 1 View  03/14/2015   CLINICAL DATA:  Cough  EXAM: PORTABLE CHEST - 1 VIEW  COMPARISON:  07/02/2014  FINDINGS: Cardiac shadow is stable. Patchy infiltrates are identified throughout both lungs. No sizable effusion is seen no pneumothorax is noted. The bony structures are within normal limits.  IMPRESSION: Bilateral patchy infiltrates worse on the right than the left. Followup films to resolution are recommended.   Electronically Signed   By: Inez Catalina M.D.   On: 03/14/2015 09:42    EKG: None  Assessment/Plan  Sepsis secondary to HCAP -Patient will be admitted to medical floor -Upon admission, patient was slightly tachycardic, tachypneic, with leukocytosis -CXR: Bilateral patchy infiltrates worse on R>L -Will place patient on vancomycin and Zosyn, antitussives, IV fluids, Tylenol for fevers, names as needed -Will obtain sputum culture and Gram stain, strep pneumonia and legionella urine antigens, will also check for  influenza -Lactic acid 1.41  Essential hypertension  -Continue amlodipine  Dementia -Stable  Depression/anxiety -Continue Effexor, Risperdal, Remeron, Ativan  History of breast cancer  Normocytic Anemia -Hemoglobin currently 8.6, has been approximately 10-11 in the past. -Will obtain anemia panel/fecal occult and continue to monitor CBC.  DVT prophylaxis: SCDs  Code Status: DNR  Condition: Guarded  Family Communication: Daughter at bedside. Admission, patients condition and plan of care including tests being ordered have been discussed with the patient's daughter, who indicates understanding and agrees with the plan and Code Status.  Disposition Plan: Admitted  Time spent: 60 minutes  Iain Sawchuk D.O. [facility] Pager [phone]  If 7PM-7AM, please contact night-coverage [email or web address] Password [facility] 03/14/2015, 12:05 PM

## 2015-03-14 NOTE — ED Notes (Signed)
Bed: EN40 Expected date: 03/14/15 Expected time:  Means of arrival:  Comments: Ems

## 2015-03-14 NOTE — ED Notes (Signed)
I tried to get blood and was unsuccessful

## 2015-03-15 LAB — CBC
HEMATOCRIT: 24.4 % — AB (ref 36.0–46.0)
Hemoglobin: 7.7 g/dL — ABNORMAL LOW (ref 12.0–15.0)
MCH: 28.4 pg (ref 26.0–34.0)
MCHC: 31.6 g/dL (ref 30.0–36.0)
MCV: 90 fL (ref 78.0–100.0)
Platelets: 183 10*3/uL (ref 150–400)
RBC: 2.71 MIL/uL — ABNORMAL LOW (ref 3.87–5.11)
RDW: 15.9 % — ABNORMAL HIGH (ref 11.5–15.5)
WBC: 8.4 10*3/uL (ref 4.0–10.5)

## 2015-03-15 LAB — BASIC METABOLIC PANEL
Anion gap: 10 (ref 5–15)
BUN: 22 mg/dL (ref 6–23)
CALCIUM: 8.3 mg/dL — AB (ref 8.4–10.5)
CO2: 25 mmol/L (ref 19–32)
CREATININE: 0.82 mg/dL (ref 0.50–1.10)
Chloride: 107 mmol/L (ref 96–112)
GFR calc Af Amer: 68 mL/min — ABNORMAL LOW (ref >90)
GFR, EST NON AFRICAN AMERICAN: 59 mL/min — AB (ref >90)
Glucose, Bld: 110 mg/dL — ABNORMAL HIGH (ref 70–99)
Potassium: 3.2 mmol/L — ABNORMAL LOW (ref 3.5–5.1)
SODIUM: 142 mmol/L (ref 135–145)

## 2015-03-15 LAB — HIV ANTIBODY (ROUTINE TESTING W REFLEX): HIV Screen 4th Generation wRfx: NONREACTIVE

## 2015-03-15 LAB — MRSA PCR SCREENING: MRSA by PCR: NEGATIVE

## 2015-03-15 MED ORDER — FUROSEMIDE 10 MG/ML IJ SOLN
20.0000 mg | Freq: Once | INTRAMUSCULAR | Status: AC
  Administered 2015-03-15: 20 mg via INTRAVENOUS
  Filled 2015-03-15: qty 2

## 2015-03-15 MED ORDER — IPRATROPIUM-ALBUTEROL 0.5-2.5 (3) MG/3ML IN SOLN: 3.0000 mL | RESPIRATORY_TRACT | Status: DC | PRN

## 2015-03-15 NOTE — Evaluation (Addendum)
Physical Therapy Evaluation Patient Details Name: Bethany Fox MRN: [id number] DOB: 23-Jun-1917 Today's Date: 03/15/2015   History of Present Illness  81 yo female with onset PNA and dementia, fever and leukocytosis.   PMHx:  HTN, anxiety, ulcer   Clinical Impression  Pt was seen for evaluation of her condition after PNA dx.  Her plan is to return to SNF for therapy to restore her PLOF which was long gait trips with restorative care nursing.  Will see her until DC and should not need any further equipment.    Follow Up Recommendations SNF;Supervision/Assistance - 24 hour    Equipment Recommendations  Rolling walker with 5" wheels;3in1 (PT)    Recommendations for Other Services       Precautions / Restrictions Precautions Precautions: Fall Precaution Comments: dementia, impulsive Restrictions Weight Bearing Restrictions: No      Mobility  Bed Mobility Overal bed mobility: Needs Assistance Bed Mobility: Rolling;Supine to Sit;Sit to Supine Rolling: Mod assist   Supine to sit: Max assist Sit to supine: Total assist      Transfers Overall transfer level: Needs assistance Equipment used: Rolling walker (2 wheeled);1 person hand held assist Transfers: Sit to/from Stand Sit to Stand: Mod assist;Max assist         General transfer comment: Pt struggles with sequencing and safety, unaware of how to use UE's  Ambulation/Gait             General Gait Details: unable   Stairs            Wheelchair Mobility    Modified Rankin (Stroke Patients Only)       Balance Overall balance assessment: Needs assistance Sitting-balance support: Feet supported;Bilateral upper extremity supported Sitting balance-Leahy Scale: Poor   Postural control: Posterior lean Standing balance support: Bilateral upper extremity supported Standing balance-Leahy Scale: Poor                               Pertinent Vitals/Pain Pain Assessment: No/denies pain     Home Living Family/patient expects to be discharged to:: Skilled nursing facility                      Prior Function Level of Independence: Needs assistance   Gait / Transfers Assistance Needed: restorative care to walk with RW for long hallway trips  ADL's / Homemaking Assistance Needed: SNF resident        Hand Dominance        Extremity/Trunk Assessment   Upper Extremity Assessment: Generalized weakness           Lower Extremity Assessment: Generalized weakness      Cervical / Trunk Assessment: Kyphotic  Communication   Communication: Expressive difficulties  Cognition Arousal/Alertness: Awake/alert Behavior During Therapy: Impulsive;Agitated (at times) Overall Cognitive Status: History of cognitive impairments - at baseline Area of Impairment: Orientation;Attention;Memory;Following commands;Safety/judgement;Awareness;Problem solving Orientation Level: Place;Time;Situation Current Attention Level: Alternating Memory: Decreased recall of precautions;Decreased short-term memory Following Commands: Follows one step commands inconsistently Safety/Judgement: Decreased awareness of safety;Decreased awareness of deficits Awareness: Emergent;Anticipatory Problem Solving: Slow processing;Decreased initiation;Difficulty sequencing;Requires verbal cues;Requires tactile cues      General Comments General comments (skin integrity, edema, etc.): Daughter who is in but states her mother's POA is dying at present.      Exercises        Assessment/Plan    PT Assessment Patient needs continued PT services  PT Diagnosis Generalized weakness;Altered mental status  PT Problem List Decreased strength;Decreased range of motion;Decreased activity tolerance;Decreased balance;Decreased mobility;Decreased coordination;Decreased cognition;Decreased knowledge of use of DME;Decreased safety awareness;Decreased knowledge of precautions;Decreased skin integrity  PT  Treatment Interventions DME instruction;Gait training;Functional mobility training;Therapeutic activities;Therapeutic exercise;Balance training;Neuromuscular re-education;Patient/family education;Cognitive remediation   PT Goals (Current goals can be found in the Care Plan section) Acute Rehab PT Goals Patient Stated Goal: did not state PT Goal Formulation: With family Time For Goal Achievement: 03/29/15 Potential to Achieve Goals: Good    Frequency Min 3X/week   Barriers to discharge   going back to SNF    Co-evaluation               End of Session Equipment Utilized During Treatment: Oxygen Activity Tolerance: Patient limited by lethargy Patient left: in bed;with call bell/phone within reach;with bed alarm set;with family/visitor present;with nursing/sitter in room Nurse Communication: Mobility status         Time: 2820-6015 PT Time Calculation (min) (ACUTE ONLY): 32 min   Charges:   PT Evaluation $Initial PT Evaluation Tier I: 1 Procedure PT Treatments $Therapeutic Activity: 8-22 mins   PT G Codes:        Ramond Dial 03-16-2015, 5:29 PM  Mee Hives, PT MS Acute Rehab Dept. Number: [phone]

## 2015-03-15 NOTE — Progress Notes (Addendum)
Patient ID: Bethany Fox, female   DOB: May 22, 1917, 81 y.o.   MRN: [id number] [facility] PROGRESS NOTE  AZAYLEA MAVES MGQ:[id number] DOB: 27-Apr-1917 DOA: 04/12/2015 PCP: Odette Fraction, MD  Brief narrative:     81 year old female from nursing home with past medical history of breast cancer, dementia, hypertension, anxiety and depression who presented to [facility] hospital with worsening shortness of breath and fever over past few days prior to this admission. Patient was found to have bilateral patchy infiltrates on chest x-ray. She was started on broad-spectrum antibiotics for treatment of healthcare associated pneumonia.    Assessment/Plan:    Principal Problem:   Sepsis secondary to healthcare associated pneumonia / leukocytosis - Sepsis criteria met on the admission with tachycardia, tachypnea, low-grade fever, leukocytosis. Evidence of infection based on chest x-ray - bilateral patchy infiltrates, right worse than left. - Continue vancomycin and Zosyn - Blood cultures to date are negative - Influenza and is negative.   Active Problems:   Dementia / anxiety and depression - Stable - Continue Effexor 75 mg daily, risperidone 0.25 mg at bedtime - PT evaluation order placed.    HTN (hypertension), essential - Continue Norvasc 5 mg daily    Hypokalemia - Likely secondary to sepsis. Supplemented.    Anemia, normocytic - Hemoglobin 7.7 this morning. No evidence of bleeding. Repeat CBC in the morning. - Transfuse if hemoglobin less than 7.    DVT Prophylaxis  - SCDs bilaterally   Code Status: DNR/DNI  Family Communication:  plan of care discussed with the patient's daughter  Disposition Plan: patient is not stable for discharge, she is receiving antibiotics for treatment of pneumonia. She needs physical therapy evaluation for safe discharge plan.  IV access:  Peripheral IV  Procedures and diagnostic studies:    Dg Chest Port 1 View Apr 12, 2015   Bilateral  patchy infiltrates worse on the right than the left. Followup films to resolution are recommended.    Medical Consultants:  None   Other Consultants:  Physical therapy   IAnti-Infectives:   Vancomycin 04-12-15 --> Zosyn 04/12/2015  -->   Leisa Lenz, MD  [facility] Pager [phone]  If 7PM-7AM, please contact night-coverage [email or web address] Password [email or web address] 03/15/2015, 11:12 AM   LOS: 1 day    HPI/Subjective: No acute overnight events.  Objective: Filed Vitals:   04/12/2015 1437 04/12/15 2141 Apr 12, 2015 2316 03/15/15 0513  BP:  129/65  113/45  Pulse:  101  77  Temp:    98 F (36.7 C)  TempSrc:  Oral  Axillary  Resp:  20  20  Height: 5' (1.524 m)     Weight: 58.968 kg (130 lb)     SpO2:  100% 97% 97%    Intake/Output Summary (Last 24 hours) at 03/15/15 1112 Last data filed at 2015-04-12 1852  Gross per 24 hour  Intake    240 ml  Output      0 ml  Net    240 ml    Exam:   General:  Pt is sleeping, no distress  Cardiovascular: Regular rate and rhythm, S1/S2 (+)  Respiratory: Diminished breath sounds bilaterally, no wheezing   Abdomen: Soft, non tender, non distended, bowel sounds present  Extremities: No edema, pulses DP and PT palpable bilaterally  Neuro: Grossly nonfocal  Data Reviewed: Basic Metabolic Panel:  Recent Labs Lab 04-12-15 0957 03/15/15 0430  NA 140 142  K 3.4* 3.2*  CL 106 107  CO2 23 25  GLUCOSE 136* 110*  BUN 26*  22  CREATININE 0.68 0.82  CALCIUM 8.5 8.3*   Liver Function Tests:  Recent Labs Lab 03/14/15 0957  AST 97*  ALT 73*  ALKPHOS 119*  BILITOT 0.8  PROT 7.0  ALBUMIN 2.7*   No results for input(s): LIPASE, AMYLASE in the last 168 hours. No results for input(s): AMMONIA in the last 168 hours. CBC:  Recent Labs Lab 03/14/15 0957 03/15/15 0430  WBC 12.9* 8.4  NEUTROABS 11.2*  --   HGB 8.6* 7.7*  HCT 27.7* 24.4*  MCV 89.9 90.0  PLT 197 183   Cardiac Enzymes: No results for input(s): CKTOTAL, CKMB,  CKMBINDEX, TROPONINI in the last 168 hours. BNP: Invalid input(s): POCBNP CBG: No results for input(s): GLUCAP in the last 168 hours.  Recent Results (from the past 240 hour(s))  Blood culture (routine x 2)     Status: None (Preliminary result)   Collection Time: 03/14/15 10:45 AM  Result Value Ref Range Status   Specimen Description BLOOD LEFT ANTECUBITAL  Final   Special Requests BOTTLES DRAWN AEROBIC AND ANAEROBIC 3.5CC ANA 5CC  Final   Culture   Final           BLOOD CULTURE RECEIVED NO GROWTH TO DATE   Report Status PENDING  Incomplete  Blood culture (routine x 2)     Status: None (Preliminary result)   Collection Time: 03/14/15 10:45 AM  Result Value Ref Range Status   Specimen Description BLOOD RIGHT ANTECUBITAL  Final   Special Requests BOTTLES DRAWN AEROBIC AND ANAEROBIC 5CC  Final   Culture   Final           BLOOD CULTURE RECEIVED NO GROWTH TO DATE     Report Status PENDING  Incomplete  MRSA PCR Screening     Status: None   Collection Time: 03/15/15  3:06 AM  Result Value Ref Range Status   MRSA by PCR NEGATIVE NEGATIVE Final     Scheduled Meds: . amLODipine  5 mg Oral Daily  . aspirin  325 mg Oral BID  . cholecalciferol  2,000 Units Oral Daily  . dextromethorphan-guaiFENesin  1 tablet Oral BID  . docusate sodium  100 mg Oral BID  . mirtazapine  7.5 mg Oral QHS  . piperacillin-tazobactam (ZOSYN)  IV  3.375 g Intravenous Q8H  . polyethylene glycol  17 g Oral Daily  . risperiDONE  0.25 mg Oral QHS  . vancomycin  750 mg Intravenous Q24H  . venlafaxine XR  75 mg Oral Q breakfast   Continuous Infusions: . 0.9 % NaCl with KCl 20 mEq / L 50 mL/hr at 03/14/15 1816

## 2015-03-16 LAB — BASIC METABOLIC PANEL
ANION GAP: 10 (ref 5–15)
BUN: 16 mg/dL (ref 6–23)
CO2: 27 mmol/L (ref 19–32)
CREATININE: 0.79 mg/dL (ref 0.50–1.10)
Calcium: 8.3 mg/dL — ABNORMAL LOW (ref 8.4–10.5)
Chloride: 106 mmol/L (ref 96–112)
GFR calc non Af Amer: 69 mL/min — ABNORMAL LOW (ref >90)
GFR, EST AFRICAN AMERICAN: 79 mL/min — AB (ref >90)
Glucose, Bld: 106 mg/dL — ABNORMAL HIGH (ref 70–99)
POTASSIUM: 3.1 mmol/L — AB (ref 3.5–5.1)
Sodium: 143 mmol/L (ref 135–145)

## 2015-03-16 LAB — CBC
HCT: 25.6 % — ABNORMAL LOW (ref 36.0–46.0)
HEMOGLOBIN: 8.3 g/dL — AB (ref 12.0–15.0)
MCH: 28.8 pg (ref 26.0–34.0)
MCHC: 32.4 g/dL (ref 30.0–36.0)
MCV: 88.9 fL (ref 78.0–100.0)
PLATELETS: 192 10*3/uL (ref 150–400)
RBC: 2.88 MIL/uL — AB (ref 3.87–5.11)
RDW: 15.5 % (ref 11.5–15.5)
WBC: 7.9 10*3/uL (ref 4.0–10.5)

## 2015-03-16 MED ORDER — POTASSIUM CHLORIDE 10 MEQ/100ML IV SOLN
10.0000 meq | INTRAVENOUS | Status: AC
Start: 2015-03-16 — End: 2015-03-16
  Administered 2015-03-16 (×3): 10 meq via INTRAVENOUS
  Filled 2015-03-16 (×3): qty 100

## 2015-03-16 NOTE — Progress Notes (Signed)
Clinical Social Work Department BRIEF PSYCHOSOCIAL ASSESSMENT 03/16/2015  Patient:  Bethany, Fox     Account Number:  [id number]     Admit date:  03/14/2015  Clinical Social Worker:  Maryln Manuel  Date/Time:  03/15/2015 01:00 PM  Referred by:  Physician  Date Referred:  03/15/2015 Referred for  SNF Placement   Other Referral:   Interview type:  Family Other interview type:    PSYCHOSOCIAL DATA Living Status:  FACILITY Admitted from facility:  [place] Level of care:  [place] Primary support name:  Bethany Fox/daughter/[phone] Primary support relationship to patient:  CHILD, ADULT Degree of support available:   strong    CURRENT CONCERNS Current Concerns  Post-Acute Placement   Other Concerns:    SOCIAL WORK ASSESSMENT / PLAN CSW received referral that pt admitted from [facility] and [place].    CSW met with pt and pt daughter, Bethany Reichmann at bedside. CSW introduced self and explained role. Pt has a history of dementia and oriented to person only. Pt not able to engage in assessment, but pt daughter very attentive and engaged. Pt daughter confirmed that pt admitted from [facility] and [place]. Pt daughter discussed that pt was admitted to [facility] in July following a hip fracture following a fall at [place]. Pt daughter shared that pt family decided to continue to privately pay at [facility] following pt rehabiliation. Pt daughter discussed that pt family has been pleased with the care that pt has been receiving at [facility] provided supportive listening as pt daughter discussed that she is concerned about pt medical condition and discussed that they ran a number of test, but pt daughter stated she has not yet received results. Pt daughter very eager to speak with MD regarding pt results. Pt daughter discussed that pt family is holding pt bed at [facility] and pt daughter eager to know anticipated d/c  date. CSW provided emotional support to pt daughter as she discussed that her sister, pt other daughter, has cancer and had shared with her today that her prognosis is poor. Pt daughter, Bethany Reichmann discussed that she is trying to do all she can for pt so that her sister can concentrate on herself at this time.    CSW notified MD that pt daughter wished for an update.    Pt daughter, Bethany Reichmann discussed that plan will be for pt to return to [facility] when medically ready for discharge.    CSW completed FL2 and sent clinicals to [facility]. CSW contacted [facility] and confirmed that pt can return when medically ready.    CSW to continue to follow to provide support and assist with pt discharge planning needs.   Assessment/plan status:  Psychosocial Support/Ongoing Assessment of Needs Other assessment/ plan:   discharge planning   Information/referral to community resources:   Referral back to [facility].    PATIENT'S/FAMILY'S RESPONSE TO PLAN OF CARE: Pt alert and oriented to person only. Pt daughter supportive and actively engaged in pt care. Pt daughter expressed being overwhelmed at this time due to not knowing test results for pt and coping with the news of her sister. Support provided. CSW to continue to follow for pt return to [facility].   Alison Murray, MSW, [place] Work [phone]

## 2015-03-16 NOTE — Progress Notes (Addendum)
Patient ID: Bethany Fox, female   DOB: [birth year], 81 y.o.   MRN: [id number] [facility] PROGRESS NOTE  NATSUKO KELSAY ELM:[id number] DOB: Sep 19, 1917 DOA: March 24, 2015 PCP: Odette Fraction, MD  Brief narrative:    81 year old female from nursing home with past medical history of breast cancer, dementia, hypertension, anxiety and depression who presented to [facility] long hospital with worsening shortness of breath and fever over past few days prior to this admission. Patient was found to have bilateral patchy infiltrates on chest x-ray. She was started on broad-spectrum antibiotics for treatment of healthcare associated pneumonia.   Assessment/Plan:    Principal Problem:   Sepsis secondary to healthcare associated pneumonia / leukocytosis - Sepsis criteria met on the admission with tachycardia, tachypnea, low-grade fever, leukocytosis. Evidence of infection based on chest x-ray - bilateral patchy infiltrates, right worse than left. - Patient was started on broad-spectrum antibiotics on the admission, vancomycin and Zosyn. We'll continue this regimen. - Influenza is negative. Blood cultures to date are negative.  Active Problems:   Dementia / anxiety and depression - Stable - Continue Effexor 75 mg daily, risperidone 0.25 mg at bedtime - Per physical therapy evaluation recommendation is for skilled nursing facility placement social work assisting discharge planning.    HTN (hypertension), essential - Continue Norvasc 5 mg daily    Hypokalemia - Likely secondary to sepsis.  - Being supplemented daily.    Anemia, normocytic - Hemoglobin 7.7 on 03/15/2015. It has spontaneously improved to 8.3. - No reports of bleeding. No current indications for transfusion.    DVT Prophylaxis  - SCDs bilaterally   Code Status: DNR/DNI  Family Communication:  plan of care discussed with the patient's daughter  Disposition Plan: Discharge to skilled nursing facility likely Monday.  IV  access:  Peripheral IV  Procedures and diagnostic studies:    Dg Chest Port 1 View 03-24-2015   Bilateral patchy infiltrates worse on the right than the left. Followup films to resolution are recommended.    Medical Consultants:  None   Other Consultants:  Physical therapy  SW  IAnti-Infectives:   Vancomycin 2015/03/24 --> Zosyn Mar 24, 2015  -->   Leisa Lenz, MD  [facility] Pager [phone]  If 7PM-7AM, please contact night-coverage [email or web address] Password [email or web address] 03/16/2015, 11:06 AM   LOS: 2 days    HPI/Subjective: No acute overnight events.  Objective: Filed Vitals:   03/15/15 0513 03/15/15 1344 03/15/15 2122 03/16/15 0624  BP: 113/45 141/53 121/59 134/48  Pulse: 77 87 87 72  Temp: 98 F (36.7 C) 98.7 F (37.1 C)  97.5 F (36.4 C)  TempSrc: Axillary Axillary  Axillary  Resp: '20 20 20 20  ' Height:      Weight:      SpO2: 97% 100% 100% 99%   No intake or output data in the 24 hours ending 03/16/15 1106  Exam:   General:  Pt is not in acute distress  Cardiovascular: Regular rate and rhythm, S1/S2 (+)  Respiratory: No wheezing, no crackles  Abdomen: Nondistended abdomen, nontender, appreciable bowel sounds  Extremities: No lower extremity swelling, pulses palpable  Neuro: Nonfocal  Data Reviewed: Basic Metabolic Panel:  Recent Labs Lab 03/24/2015 0957 03/15/15 0430 03/16/15 0430  NA 140 142 143  K 3.4* 3.2* 3.1*  CL 106 107 106  CO2 '23 25 27  ' GLUCOSE 136* 110* 106*  BUN 26* 22 16  CREATININE 0.68 0.82 0.79  CALCIUM 8.5 8.3* 8.3*   Liver Function Tests:  Recent Labs Lab Mar 24, 2015 0957  AST 97*  ALT 73*  ALKPHOS 119*  BILITOT 0.8  PROT 7.0  ALBUMIN 2.7*   No results for input(s): LIPASE, AMYLASE in the last 168 hours. No results for input(s): AMMONIA in the last 168 hours. CBC:  Recent Labs Lab 03/14/15 0957 03/15/15 0430 03/16/15 0430  WBC 12.9* 8.4 7.9  NEUTROABS 11.2*  --   --   HGB 8.6* 7.7* 8.3*  HCT 27.7* 24.4*  25.6*  MCV 89.9 90.0 88.9  PLT 197 183 192   Cardiac Enzymes: No results for input(s): CKTOTAL, CKMB, CKMBINDEX, TROPONINI in the last 168 hours. BNP: Invalid input(s): POCBNP CBG: No results for input(s): GLUCAP in the last 168 hours.  Recent Results (from the past 240 hour(s))  Blood culture (routine x 2)     Status: None (Preliminary result)   Collection Time: 03/14/15 10:45 AM  Result Value Ref Range Status   Specimen Description BLOOD LEFT ANTECUBITAL  Final   Special Requests BOTTLES DRAWN AEROBIC AND ANAEROBIC 3.5CC ANA 5CC  Final   Culture   Final           BLOOD CULTURE RECEIVED NO GROWTH TO DATE   Report Status PENDING  Incomplete  Blood culture (routine x 2)     Status: None (Preliminary result)   Collection Time: 03/14/15 10:45 AM  Result Value Ref Range Status   Specimen Description BLOOD RIGHT ANTECUBITAL  Final   Special Requests BOTTLES DRAWN AEROBIC AND ANAEROBIC 5CC  Final   Culture   Final           BLOOD CULTURE RECEIVED NO GROWTH TO DATE     Report Status PENDING  Incomplete  MRSA PCR Screening     Status: None   Collection Time: 03/15/15  3:06 AM  Result Value Ref Range Status   MRSA by PCR NEGATIVE NEGATIVE Final     Scheduled Meds: . amLODipine  5 mg Oral Daily  . aspirin  325 mg Oral BID  . cholecalciferol  2,000 Units Oral Daily  . dextromethorphan-guaiFENesin  1 tablet Oral BID  . docusate sodium  100 mg Oral BID  . mirtazapine  7.5 mg Oral QHS  . piperacillin-tazobactam (ZOSYN)  IV  3.375 g Intravenous Q8H  . polyethylene glycol  17 g Oral Daily  . risperiDONE  0.25 mg Oral QHS  . vancomycin  750 mg Intravenous Q24H  . venlafaxine XR  75 mg Oral Q breakfast   Continuous Infusions:

## 2015-03-16 NOTE — Care Management Note (Signed)
CARE MANAGEMENT NOTE 03/16/2015  Patient:  MAYO, OWCZARZAK   Account Number:  [id number]  Date Initiated:  03/16/2015  Documentation initiated by:  Marney Doctor  Subjective/Objective Assessment:   81 yo admitted with Sepsis due to pneumonia     Action/Plan:   From [facility] SNF   Anticipated DC Date:  03/18/2015   Anticipated DC Plan:  SKILLED NURSING FACILITY  In-house referral  Clinical Social Worker      DC Planning Services  CM consult      Choice offered to / List presented to:             Status of service:   Medicare Important Message given?   (If response is "NO", the following Medicare IM given date fields will be blank) Date Medicare IM given:   Medicare IM given by:   Date Additional Medicare IM given:   Additional Medicare IM given by:    Discharge Disposition:    Per UR Regulation:  Reviewed for med. necessity/level of care/duration of stay  If discussed at [place] of Stay Meetings, dates discussed:    Comments:  03/16/15 Marney Doctor RN,BSN,NCM [phone] Chart reviewed and CM following for DC needs.

## 2015-03-17 ENCOUNTER — Inpatient Hospital Stay (HOSPITAL_COMMUNITY): Payer: Medicare Other

## 2015-03-17 LAB — VANCOMYCIN, TROUGH: VANCOMYCIN TR: 8.6 ug/mL — AB (ref 10.0–20.0)

## 2015-03-17 NOTE — Progress Notes (Signed)
CSW continuing to follow as pt admitted from [facility] [place].  CSW spoke with MD who stated that pt will be medically ready for d/c tomorrow.   CSW met with pt daughter, Jenny Reichmann at bedside and updated pt daughter. Pt daughter expressed understanding and eager for pt to return to [facility].   CSW clarified pt daughter's questions surrounding discharge to [facility] during the weekend and confirmed pt daughter's phone number for weekend CSW to contact regarding pt discharge tomorrow.   CSW notified [facility] and facility confirmed that they can accept pt tomorrow.   CSW to continue to follow to provide support and weekend CSW will facilitate pt discharge to [facility].   Alison Murray, MSW, [place] Work [phone]

## 2015-03-17 NOTE — Progress Notes (Addendum)
Patient ID: Bethany Fox, female   DOB: [birth year], 81 y.o.   MRN: [id number] [facility] PROGRESS NOTE  TACEY DIMAGGIO PYY:[id number] DOB: 1917-08-14 DOA: 2015-03-20 PCP: Odette Fraction, MD  Brief narrative:    81 year old female from nursing home with past medical history of breast cancer, dementia, hypertension, anxiety and depression who presented to [facility] with worsening shortness of breath and fever over past few days prior to this admission. Patient was found to have bilateral patchy infiltrates on chest x-ray. She was started on broad-spectrum antibiotics for treatment of healthcare associated pneumonia.   Assessment/Plan:    Principal Problem:   Sepsis secondary to healthcare associated pneumonia / leukocytosis - Sepsis criteria met on the admission with tachycardia, tachypnea, low-grade fever, leukocytosis. Evidence of infection based on chest x-ray - bilateral patchy infiltrates, right worse than left. - Continue current antibiotics; switch to PO in am prior to discharge  - Influenza is negative. Blood cultures so far are negative.  Active Problems:   Dementia / anxiety and depression - Stable - Continue Effexor 75 mg daily, risperidone 0.25 mg at bedtime    HTN (hypertension), essential - Continue Norvasc 5 mg daily - BP at goal    Hypokalemia - Likely secondary to sepsis.  - Repleted     Anemia, normocytic - Hemoglobin 7.7 on 03/15/2015. It has spontaneously improved to 8.3. - Check CBC in am    DVT Prophylaxis  - SCDs bilaterally   Code Status: DNR/DNI  Family Communication:  plan of care discussed with the patient's daughter  Disposition Plan: Discharge to skilled nursing facility likely Saturday.  IV access:  Peripheral IV  Procedures and diagnostic studies:    Dg Chest Port 1 View 2015-03-20   Bilateral patchy infiltrates worse on the right than the left. Followup films to resolution are recommended.    Medical Consultants:   None   Other Consultants:  Physical therapy  SW  IAnti-Infectives:   Vancomycin 03-20-2015 --> Zosyn 03/20/2015  -->   Leisa Lenz, MD  [facility] Pager [phone]  If 7PM-7AM, please contact night-coverage [email or web address] Password [facility] 03/17/2015, 12:57 PM   LOS: 3 days    HPI/Subjective: No acute overnight events.  Objective: Filed Vitals:   03/16/15 1540 03/16/15 2129 03/17/15 0540 03/17/15 1021  BP: 136/51 116/47 127/66 129/51  Pulse: 73 86 77 73  Temp: 98.6 F (37 C) 98.9 F (37.2 C) 97.9 F (36.6 C)   TempSrc: Axillary Oral Oral   Resp: '20 20 20   ' Height:      Weight:      SpO2: 99% 100% 95%     Intake/Output Summary (Last 24 hours) at 03/17/15 1257 Last data filed at 03/17/15 0854  Gross per 24 hour  Intake    690 ml  Output      0 ml  Net    690 ml    Exam:   General:  Pt is awake, no distress  Cardiovascular: rate controlled, Appreciate S1, S2  Respiratory: bilateral air entry, no wheezing  Abdomen: soft, no tenderness, (+) BS  Extremities: no edema, pulses palpable bilaterally  Neuro: No focal deficits   Data Reviewed: Basic Metabolic Panel:  Recent Labs Lab 2015/03/20 0957 03/15/15 0430 03/16/15 0430  NA 140 142 143  K 3.4* 3.2* 3.1*  CL 106 107 106  CO2 '23 25 27  ' GLUCOSE 136* 110* 106*  BUN 26* 22 16  CREATININE 0.68 0.82 0.79  CALCIUM 8.5 8.3* 8.3*  Liver Function Tests:  Recent Labs Lab 03/14/15 0957  AST 97*  ALT 73*  ALKPHOS 119*  BILITOT 0.8  PROT 7.0  ALBUMIN 2.7*   No results for input(s): LIPASE, AMYLASE in the last 168 hours. No results for input(s): AMMONIA in the last 168 hours. CBC:  Recent Labs Lab 03/14/15 0957 03/15/15 0430 03/16/15 0430  WBC 12.9* 8.4 7.9  NEUTROABS 11.2*  --   --   HGB 8.6* 7.7* 8.3*  HCT 27.7* 24.4* 25.6*  MCV 89.9 90.0 88.9  PLT 197 183 192   Cardiac Enzymes: No results for input(s): CKTOTAL, CKMB, CKMBINDEX, TROPONINI in the last 168  hours. BNP: Invalid input(s): POCBNP CBG: No results for input(s): GLUCAP in the last 168 hours.  Recent Results (from the past 240 hour(s))  Blood culture (routine x 2)     Status: None (Preliminary result)   Collection Time: 03/14/15 10:45 AM  Result Value Ref Range Status   Specimen Description BLOOD LEFT ANTECUBITAL  Final   Special Requests BOTTLES DRAWN AEROBIC AND ANAEROBIC 3.5CC ANA 5CC  Final   Culture   Final           BLOOD CULTURE RECEIVED NO GROWTH TO DATE   Report Status PENDING  Incomplete  Blood culture (routine x 2)     Status: None (Preliminary result)   Collection Time: 03/14/15 10:45 AM  Result Value Ref Range Status   Specimen Description BLOOD RIGHT ANTECUBITAL  Final   Special Requests BOTTLES DRAWN AEROBIC AND ANAEROBIC 5CC  Final   Culture   Final           BLOOD CULTURE RECEIVED NO GROWTH TO DATE     Report Status PENDING  Incomplete  MRSA PCR Screening     Status: None   Collection Time: 03/15/15  3:06 AM  Result Value Ref Range Status   MRSA by PCR NEGATIVE NEGATIVE Final     Scheduled Meds: . amLODipine  5 mg Oral Daily  . aspirin  325 mg Oral BID  . cholecalciferol  2,000 Units Oral Daily  . dextromethorphan-guaiFENesin  1 tablet Oral BID  . docusate sodium  100 mg Oral BID  . mirtazapine  7.5 mg Oral QHS  . piperacillin-tazobactam (ZOSYN)  IV  3.375 g Intravenous Q8H  . polyethylene glycol  17 g Oral Daily  . risperiDONE  0.25 mg Oral QHS  . vancomycin  750 mg Intravenous Q24H  . venlafaxine XR  75 mg Oral Q breakfast   Continuous Infusions:

## 2015-03-17 NOTE — Care Management Note (Signed)
03/17/15 Marney Doctor RN,BSN,NCM [phone] IM letter delivered.

## 2015-03-17 NOTE — Progress Notes (Signed)
ANTIBIOTIC CONSULT NOTE - INITIAL  Pharmacy Consult for Vanc, Zosyn, and renal-adjustment of antibiotics Indication: HCAP  Allergies  Allergen Reactions  . Adhesive [Tape]     Unknown   . Ciprofloxacin Rash    Patient Measurements: Height: 5' (152.4 cm) Weight: 130 lb (58.968 kg) IBW/kg (Calculated) : 45.5 Adjusted Body Weight:   Vital Signs: Temp: 98.1 F (36.7 C) (03/18 1410) Temp Source: Axillary (03/18 1410) BP: 143/63 mmHg (03/18 1410) Pulse Rate: 82 (03/18 1410) Intake/Output from previous day: 03/17 0701 - 03/18 0700 In: 530 [P.O.:480; IV Piggyback:50] Out: -  Intake/Output from this shift: Total I/O In: 640 [P.O.:640] Out: -   Labs:  Recent Labs  03/15/15 0430 03/16/15 0430  WBC 8.4 7.9  HGB 7.7* 8.3*  PLT 183 192  CREATININE 0.82 0.79   Estimated Creatinine Clearance: 33.8 mL/min (by C-G formula based on Cr of 0.79).  Recent Labs  03/17/15 1355  [place] 8.6*     Microbiology: Recent Results (from the past 720 hour(s))  Blood culture (routine x 2)     Status: None (Preliminary result)   Collection Time: 03/14/15 10:45 AM  Result Value Ref Range Status   Specimen Description BLOOD LEFT ANTECUBITAL  Final   Special Requests BOTTLES DRAWN AEROBIC AND ANAEROBIC 3.5CC ANA 5CC  Final   Culture   Final           BLOOD CULTURE RECEIVED NO GROWTH TO DATE CULTURE WILL BE HELD FOR 5 DAYS BEFORE ISSUING A FINAL NEGATIVE REPORT Performed at [facility]    Report Status PENDING  Incomplete  Blood culture (routine x 2)     Status: None (Preliminary result)   Collection Time: 03/14/15 10:45 AM  Result Value Ref Range Status   Specimen Description BLOOD RIGHT ANTECUBITAL  Final   Special Requests BOTTLES DRAWN AEROBIC AND ANAEROBIC 5CC  Final   Culture   Final           BLOOD CULTURE RECEIVED NO GROWTH TO DATE CULTURE WILL BE HELD FOR 5 DAYS BEFORE ISSUING A FINAL NEGATIVE REPORT Performed at [facility]    Report Status PENDING   Incomplete  MRSA PCR Screening     Status: None   Collection Time: 03/15/15  3:06 AM  Result Value Ref Range Status   MRSA by PCR NEGATIVE NEGATIVE Final    Comment:        The GeneXpert MRSA Assay (FDA approved for NASAL specimens only), is one component of a comprehensive MRSA colonization surveillance program. It is not intended to diagnose MRSA infection nor to guide or monitor treatment for MRSA infections.     Medical History: Past Medical History  Diagnosis Date  . Gastric ulcer   . Cancer     breast  . Depression   . Dementia   . Breast cancer   . H/O mastectomy     Left side     Medications:  Anti-infectives    Start     Dose/Rate Route Frequency Ordered Stop   03/15/15 1400  vancomycin (VANCOCIN) IVPB 750 mg/150 ml premix     750 mg 150 mL/hr over 60 Minutes Intravenous Every 24 hours 03/14/15 1528     03/14/15 1800  piperacillin-tazobactam (ZOSYN) IVPB 3.375 g     3.375 g 12.5 mL/hr over 240 Minutes Intravenous Every 8 hours 03/14/15 1408     03/14/15 1100  vancomycin (VANCOCIN) IVPB 1000 mg/200 mL premix     1,000 mg 200 mL/hr over  60 Minutes Intravenous  Once 03/14/15 1058 03/14/15 1438   03/14/15 1100  piperacillin-tazobactam (ZOSYN) IVPB 3.375 g     3.375 g 100 mL/hr over 30 Minutes Intravenous  Once 03/14/15 1058 03/14/15 1319     Assessment: 81yo F from a NH w/ SOB and fever. CXR shows bilateral pneumonia. Pharmacy is asked to dose Vanc and Zosyn for HCAP. First doses given in the ED.  3/15 >> Zosyn >> 3/15 >> Vanc >>   Tmax: AF WBCs: wnl Renal: (3/17)SCr 0.79, CrCl 34CG, 47N  3/15 Legionella and Strep antigens: no results?? 3/15 bcx x2: ngtd 3/15 sputum: ordered 3/15 influ PCR: neg  3/15 HIV antib: NR 3/16 MRSA PCR: neg  3/15 CXR: BL infiltrates  3/18 Vanc Trough = 8.6 prior to 3rd dose of 750mg  q24h. Below goal range but it is likely to accumulate.    Goal of Therapy:  Vancomycin trough level 15-20 mcg/ml  Appropriate  antibiotic dosing for renal function; eradication of infection  Plan:  Cont Vanc 750mg  IV q24h. Cont Zosyn 3.375g IV Q8H infused over 4hrs. Measure Vanc trough at steady state. Follow up renal fxn, culture results, and clinical course.  Romeo Rabon, PharmD, pager [phone]. 03/17/2015,3:32 PM.

## 2015-03-17 NOTE — Progress Notes (Signed)
PT Cancellation Note  Patient Details Name: HSER BELANGER MRN: [id number] DOB: June 16, 1917   Cancelled Treatment:     Unable to arouse pt enough to participate.  RN stated pt plans to D/C back to [facility] tomorrow.   Rica Koyanagi  PTA [facility]  Acute  Rehab Pager      [phone]

## 2015-03-17 NOTE — Progress Notes (Signed)
Pt is not awake at times so can not always get her to take her medication. Will cont to try.

## 2015-03-18 LAB — CBC
HCT: 24.9 % — ABNORMAL LOW (ref 36.0–46.0)
Hemoglobin: 7.8 g/dL — ABNORMAL LOW (ref 12.0–15.0)
MCH: 28.2 pg (ref 26.0–34.0)
MCHC: 31.3 g/dL (ref 30.0–36.0)
MCV: 89.9 fL (ref 78.0–100.0)
Platelets: 219 10*3/uL (ref 150–400)
RBC: 2.77 MIL/uL — ABNORMAL LOW (ref 3.87–5.11)
RDW: 15.5 % (ref 11.5–15.5)
WBC: 6.5 10*3/uL (ref 4.0–10.5)

## 2015-03-18 LAB — BASIC METABOLIC PANEL
Anion gap: 7 (ref 5–15)
BUN: 18 mg/dL (ref 6–23)
CALCIUM: 8 mg/dL — AB (ref 8.4–10.5)
CHLORIDE: 103 mmol/L (ref 96–112)
CO2: 25 mmol/L (ref 19–32)
Creatinine, Ser: 0.76 mg/dL (ref 0.50–1.10)
GFR calc Af Amer: 80 mL/min — ABNORMAL LOW (ref >90)
GFR, EST NON AFRICAN AMERICAN: 69 mL/min — AB (ref >90)
Glucose, Bld: 98 mg/dL (ref 70–99)
Potassium: 3.6 mmol/L (ref 3.5–5.1)
SODIUM: 135 mmol/L (ref 135–145)

## 2015-03-18 NOTE — Progress Notes (Addendum)
Patient ID: Bethany Fox, female   DOB: [birth year], 81 y.o.   MRN: [id number] [facility] PROGRESS NOTE  Bethany Fox [id number] DOB: September 14, 1917 DOA: 04-09-2015 PCP: Odette Fraction, MD  Brief narrative:    81 year old female from nursing home with past medical history of breast cancer, dementia, hypertension, anxiety and depression who presented to [facility] long hospital with worsening shortness of breath and fever over past few days prior to this admission. Patient was found to have bilateral patchy infiltrates on chest x-ray. She was started on broad-spectrum antibiotics for treatment of healthcare associated pneumonia.   Assessment/Plan:    Principal Problem: Sepsis secondary to healthcare associated pneumonia / leukocytosis - Sepsis criteria met on the admission with tachycardia, tachypnea, low-grade fever, leukocytosis. Evidence of infection based on chest x-ray - bilateral patchy infiltrates, right worse than left.  - We have repeated CXR 03/17/2015 and it looks like there is an improvement in patch areas of bilateral airspace opacities consistent with improvement in pneumonia. - Influenza is negative. Blood cultures so far are negative. WBC count WNL.  - Will continue vanco and zosyn for now. Change to PO prior to discharge.  Active Problems: Dementia / anxiety and depression - Stable - Continue Effexor 75 mg daily, risperidone 0.25 mg at bedtime  HTN (hypertension), essential - Continue Norvasc 5 mg daily  Hypokalemia - Likely secondary to sepsis.  - Potassium supplemented and now WNL.  Anemia, normocytic - Hemoglobin 7.7 on 03/15/2015. It has spontaneously improved to 8.3 but this am down to 7.8. - Start ferrous sulfate supplementation. Recheck CBC in am.    DVT Prophylaxis  - SCDs bilaterally   Code Status: DNR/DNI  Family Communication:  plan of care discussed with the patient's daughter  Disposition Plan: Discharge to skilled nursing facility in  next 24-48 hours   IV access:  Peripheral IV  Procedures and diagnostic studies:    Dg Chest Port 1 View Apr 09, 2015   Bilateral patchy infiltrates worse on the right than the left. Followup films to resolution are recommended.    Dg Chest Port 1 View 03/17/2015   Improvement in patchy areas of bilateral airspace opacity consistent with improved pneumonia. There is an area persistent airspace opacity in the right upper lobe and there is persistent bilateral irregular interstitial thickening. No new lung abnormalities.  Medical Consultants:  None   Other Consultants:  Physical therapy  SW  IAnti-Infectives:   Vancomycin 04-09-15 --> Zosyn 04/09/2015  -->   Leisa Lenz, MD  [facility] Pager [phone]  If 7PM-7AM, please contact night-coverage [email or web address] Password [email or web address] 03/18/2015, 12:09 PM   LOS: 4 days    HPI/Subjective: No acute overnight events.  Objective: Filed Vitals:   03/17/15 1021 03/17/15 1410 03/17/15 2100 03/18/15 0424  BP: 129/51 143/63 139/55 138/59  Pulse: 73 82 89 69  Temp:  98.1 F (36.7 C) 98.4 F (36.9 C) 97.2 F (36.2 C)  TempSrc:  Axillary Axillary Axillary  Resp:   20 20  Height:      Weight:      SpO2:  98% 93% 99%    Intake/Output Summary (Last 24 hours) at 03/18/15 1209 Last data filed at 03/18/15 0600  Gross per 24 hour  Intake   1320 ml  Output      0 ml  Net   1320 ml    Exam:   General:  Pt is sleeping this am, somehow more difficult to arise  Cardiovascular: irregular rhythm, rate controlled, Appreciate S1, S2 with  SEM appreciated +3/6  Respiratory: wheezing in upper lung lobes, no crackles   Abdomen: non tender, non distended, appreciate bowel sounds   Extremities: no leg swelling, pulses palpable bilaterally  Neuro: Non focal  Data Reviewed: Basic Metabolic Panel:  Recent Labs Lab 03/14/15 0957 03/15/15 0430 03/16/15 0430 03/18/15 0453  NA 140 142 143 135  K 3.4* 3.2* 3.1* 3.6  CL 106 107 106  103  CO2 _0 GLUCOSE 136* 110* 106* 98  BUN 26* _1 CREATININE 0.68 0.82 0.79 0.76  CALCIUM 8.5 8.3* 8.3* 8.0*   Liver Function Tests:  Recent Labs Lab 03/14/15 0957  AST 97*  ALT 73*  ALKPHOS 119*  BILITOT 0.8  PROT 7.0  ALBUMIN 2.7*   No results for input(s): LIPASE, AMYLASE in the last 168 hours. No results for input(s): AMMONIA in the last 168 hours. CBC:  Recent Labs Lab 03/14/15 0957 03/15/15 0430 03/16/15 0430 03/18/15 0453  WBC 12.9* 8.4 7.9 6.5  NEUTROABS 11.2*  --   --   --   HGB 8.6* 7.7* 8.3* 7.8*  HCT 27.7* 24.4* 25.6* 24.9*  MCV 89.9 90.0 88.9 89.9  PLT 197 183 192 219   Cardiac Enzymes: No results for input(s): CKTOTAL, CKMB, CKMBINDEX, TROPONINI in the last 168 hours. BNP: Invalid input(s): POCBNP CBG: No results for input(s): GLUCAP in the last 168 hours.  Recent Results (from the past 240 hour(s))  Blood culture (routine x 2)     Status: None (Preliminary result)   Collection Time: 03/14/15 10:45 AM  Result Value Ref Range Status   Specimen Description BLOOD LEFT ANTECUBITAL  Final   Special Requests BOTTLES DRAWN AEROBIC AND ANAEROBIC 3.5CC ANA 5CC  Final   Culture   Final           BLOOD CULTURE RECEIVED NO GROWTH TO DATE   Report Status PENDING  Incomplete  Blood culture (routine x 2)     Status: None (Preliminary result)   Collection Time: 03/14/15 10:45 AM  Result Value Ref Range Status   Specimen Description BLOOD RIGHT ANTECUBITAL  Final   Special Requests BOTTLES DRAWN AEROBIC AND ANAEROBIC 5CC  Final   Culture   Final           BLOOD CULTURE RECEIVED NO GROWTH TO DATE     Report Status PENDING  Incomplete  MRSA PCR Screening     Status: None   Collection Time: 03/15/15  3:06 AM  Result Value Ref Range Status   MRSA by PCR NEGATIVE NEGATIVE Final     Scheduled Meds: . amLODipine  5 mg Oral Daily  . aspirin  325 mg Oral BID  . cholecalciferol  2,000 Units Oral Daily  . dextromethorphan-guaiFENesin  1 tablet  Oral BID  . docusate sodium  100 mg Oral BID  . mirtazapine  7.5 mg Oral QHS  . piperacillin-tazobactam (ZOSYN)  IV  3.375 g Intravenous Q8H  . polyethylene glycol  17 g Oral Daily  . risperiDONE  0.25 mg Oral QHS  . vancomycin  750 mg Intravenous Q24H  . venlafaxine XR  75 mg Oral Q breakfast   Continuous Infusions:

## 2015-03-19 DIAGNOSIS — Z79899 Other long term (current) drug therapy: Secondary | ICD-10-CM | POA: Diagnosis not present

## 2015-03-19 DIAGNOSIS — I10 Essential (primary) hypertension: Secondary | ICD-10-CM | POA: Diagnosis not present

## 2015-03-19 DIAGNOSIS — R945 Abnormal results of liver function studies: Secondary | ICD-10-CM | POA: Diagnosis not present

## 2015-03-19 DIAGNOSIS — F0390 Unspecified dementia without behavioral disturbance: Secondary | ICD-10-CM | POA: Diagnosis not present

## 2015-03-19 DIAGNOSIS — F341 Dysthymic disorder: Secondary | ICD-10-CM | POA: Diagnosis not present

## 2015-03-19 DIAGNOSIS — D649 Anemia, unspecified: Secondary | ICD-10-CM | POA: Diagnosis not present

## 2015-03-19 DIAGNOSIS — J189 Pneumonia, unspecified organism: Secondary | ICD-10-CM | POA: Diagnosis not present

## 2015-03-19 DIAGNOSIS — R6520 Severe sepsis without septic shock: Secondary | ICD-10-CM | POA: Diagnosis not present

## 2015-03-19 DIAGNOSIS — M6281 Muscle weakness (generalized): Secondary | ICD-10-CM | POA: Diagnosis not present

## 2015-03-19 DIAGNOSIS — Z9181 History of falling: Secondary | ICD-10-CM | POA: Diagnosis not present

## 2015-03-19 DIAGNOSIS — A419 Sepsis, unspecified organism: Secondary | ICD-10-CM | POA: Diagnosis not present

## 2015-03-19 DIAGNOSIS — F0391 Unspecified dementia with behavioral disturbance: Secondary | ICD-10-CM | POA: Diagnosis not present

## 2015-03-19 DIAGNOSIS — E039 Hypothyroidism, unspecified: Secondary | ICD-10-CM | POA: Diagnosis not present

## 2015-03-19 DIAGNOSIS — E785 Hyperlipidemia, unspecified: Secondary | ICD-10-CM | POA: Diagnosis not present

## 2015-03-19 DIAGNOSIS — D72829 Elevated white blood cell count, unspecified: Secondary | ICD-10-CM | POA: Diagnosis not present

## 2015-03-19 DIAGNOSIS — E559 Vitamin D deficiency, unspecified: Secondary | ICD-10-CM | POA: Diagnosis not present

## 2015-03-19 LAB — BASIC METABOLIC PANEL
Anion gap: 10 (ref 5–15)
BUN: 17 mg/dL (ref 6–23)
CO2: 27 mmol/L (ref 19–32)
Calcium: 8.7 mg/dL (ref 8.4–10.5)
Chloride: 105 mmol/L (ref 96–112)
Creatinine, Ser: 0.84 mg/dL (ref 0.50–1.10)
GFR calc non Af Amer: 57 mL/min — ABNORMAL LOW (ref >90)
GFR, EST AFRICAN AMERICAN: 66 mL/min — AB (ref >90)
Glucose, Bld: 95 mg/dL (ref 70–99)
Potassium: 3.8 mmol/L (ref 3.5–5.1)
Sodium: 142 mmol/L (ref 135–145)

## 2015-03-19 LAB — CBC
HCT: 29.2 % — ABNORMAL LOW (ref 36.0–46.0)
Hemoglobin: 8.9 g/dL — ABNORMAL LOW (ref 12.0–15.0)
MCH: 27.6 pg (ref 26.0–34.0)
MCHC: 30.5 g/dL (ref 30.0–36.0)
MCV: 90.4 fL (ref 78.0–100.0)
PLATELETS: 279 10*3/uL (ref 150–400)
RBC: 3.23 MIL/uL — AB (ref 3.87–5.11)
RDW: 15.5 % (ref 11.5–15.5)
WBC: 6.1 10*3/uL (ref 4.0–10.5)

## 2015-03-19 MED ORDER — HYDROCODONE-ACETAMINOPHEN 5-325 MG PO TABS: 1.0000 | ORAL_TABLET | Freq: Four times a day (QID) | ORAL | Status: DC | PRN

## 2015-03-19 MED ORDER — CHLORHEXIDINE GLUCONATE 0.12 % MT SOLN
15.0000 mL | Freq: Two times a day (BID) | OROMUCOSAL | Status: DC
  Administered 2015-03-19: 15 mL via OROMUCOSAL
  Filled 2015-03-19 (×3): qty 15

## 2015-03-19 MED ORDER — IPRATROPIUM-ALBUTEROL 0.5-2.5 (3) MG/3ML IN SOLN: 3.0000 mL | RESPIRATORY_TRACT | Status: AC | PRN

## 2015-03-19 MED ORDER — CETYLPYRIDINIUM CHLORIDE 0.05 % MT LIQD
7.0000 mL | Freq: Two times a day (BID) | OROMUCOSAL | Status: DC
  Administered 2015-03-19: 7 mL via OROMUCOSAL

## 2015-03-19 MED ORDER — RISPERIDONE 0.25 MG PO TABS: 0.2500 mg | ORAL_TABLET | Freq: Every day | ORAL | Status: AC

## 2015-03-19 MED ORDER — AMOXICILLIN-POT CLAVULANATE 875-125 MG PO TABS: 1.0000 | ORAL_TABLET | Freq: Two times a day (BID) | ORAL | Status: DC

## 2015-03-19 MED ORDER — MIRTAZAPINE 7.5 MG PO TABS: 7.5000 mg | ORAL_TABLET | Freq: Every day | ORAL | Status: DC

## 2015-03-19 MED ORDER — POLYETHYLENE GLYCOL 3350 17 G PO PACK: 17.0000 g | PACK | Freq: Every day | ORAL | Status: DC

## 2015-03-19 MED ORDER — LORAZEPAM 0.5 MG PO TABS: 0.2500 mg | ORAL_TABLET | Freq: Three times a day (TID) | ORAL | Status: AC | PRN

## 2015-03-19 NOTE — Discharge Summary (Signed)
Physician Discharge Summary  Bethany Fox [id number] DOB: 1917/01/18 DOA: 03/14/2015  PCP: Odette Fraction, MD  Admit date: 03/14/2015 Discharge date: 03/19/2015  Recommendations for Outpatient Follow-up:  Continue Augmentin for 5 days on discharge Please monitor BP per SNF protocol. Allow higher baseline BP above 135/85 so to avoid hypotension.  Discharge Diagnoses:  Principal Problem:   Sepsis due to pneumonia Active Problems:   Dementia   HTN (hypertension)   HCAP (healthcare-associated pneumonia)    Discharge Condition: stable   Diet recommendation: as tolerated   History of present illness:   81 year old female from nursing home with past medical history of breast cancer, dementia, hypertension, anxiety and depression who presented to [facility] long hospital with worsening shortness of breath and fever over past few days prior to this admission. Patient was found to have bilateral patchy infiltrates on chest x-ray. She was started on broad-spectrum antibiotics for treatment of healthcare associated pneumonia.   Assessment/Plan:    Principal Problem: Sepsis secondary to healthcare associated pneumonia / leukocytosis - Sepsis criteria met on the admission with tachycardia, tachypnea, low-grade fever, leukocytosis. Evidence of infection based on chest x-ray - bilateral patchy infiltrates, right worse than left.  - Repeat CXR 03/17/2015 shows pneumonia is improving. - Was on broad spectrum abx since admission, vanco and cefepime. Will be discharged with Augmentin for 5 days which should complete 10 day treatment for HCAP. - Influenza is negative. Blood cultures so far are negative. WBC count WNL.   Active Problems: Dementia / anxiety and depression - Stable - Continue Effexor 75 mg daily, risperidone 0.25 mg at bedtime  HTN (hypertension), essential - Continue Norvasc 5 mg daily  Hypokalemia - Likely secondary to sepsis.  - Potassium supplemented   Anemia,  normocytic - Hemoglobin stable at 8.9    DVT Prophylaxis  - SCDs bilaterally   Code Status: DNR/DNI  Family Communication: plan of care discussed with the patient's daughter 03/18/2015.   IV access:  Peripheral IV  Procedures and diagnostic studies:   Dg Chest Port 1 View 03/14/2015 Bilateral patchy infiltrates worse on the right than the left. Followup films to resolution are recommended.   Dg Chest Port 1 View 03/17/2015 Improvement in patchy areas of bilateral airspace opacity consistent with improved pneumonia. There is an area persistent airspace opacity in the right upper lobe and there is persistent bilateral irregular interstitial thickening. No new lung abnormalities.  Medical Consultants:  None   Other Consultants:  Physical therapy  SW  IAnti-Infectives:   Vancomycin 03/14/2015 --> 03/19/2015 Zosyn 03/14/2015 --> 03/19/2015  Signed:  Leisa Lenz, MD  [facility] 03/19/2015, 9:44 AM  Pager #: [phone]   Discharge Exam: Filed Vitals:   03/19/15 0432  BP: 155/58  Pulse: 72  Temp: 97.9 F (36.6 C)  Resp: 20   Filed Vitals:   03/18/15 0424 03/18/15 1359 03/18/15 1959 03/19/15 0432  BP: 138/59 135/72 154/54 155/58  Pulse: 69 70 78 72  Temp: 97.2 F (36.2 C) 97.5 F (36.4 C) 98.1 F (36.7 C) 97.9 F (36.6 C)  TempSrc: Axillary Axillary Axillary Axillary  Resp: '20 20 20 20  ' Height:      Weight:      SpO2: 99% 99% 100% 100%    General: Pt is not in distress Cardiovascular: Rate controlled, irregular rhythm, S1/S2 appreciated  Respiratory: Clear to auscultation bilaterally, no wheezing, no crackles, no rhonchi Abdominal: Soft, non tender, non distended, bowel sounds +, no guarding Extremities: no edema, no cyanosis, pulses palpable bilaterally  DP and PT Neuro: Grossly nonfocal  Discharge Instructions  Discharge Instructions    Call MD for:  difficulty breathing, headache or visual disturbances    Complete  by:  As directed      Call MD for:  persistant nausea and vomiting    Complete by:  As directed      Call MD for:  severe uncontrolled pain    Complete by:  As directed      Diet - low sodium heart healthy    Complete by:  As directed      Discharge instructions    Complete by:  As directed   Continue Augmentin for 5 days on discharge Please monitor BP per SNF protocol. Allow higher baseline BP above 135/85 so to avoid hypotension.     Increase activity slowly    Complete by:  As directed             Medication List    STOP taking these medications        polyethylene glycol powder powder  Commonly known as:  GLYCOLAX/MIRALAX  Replaced by:  polyethylene glycol packet      TAKE these medications        acetaminophen 500 MG tablet  Commonly known as:  TYLENOL  Take 500 mg by mouth every 6 (six) hours as needed for fever (or for pain).     amLODipine 5 MG tablet  Commonly known as:  NORVASC  Take 1 tablet (5 mg total) by mouth daily.     amoxicillin-clavulanate 875-125 MG per tablet  Commonly known as:  AUGMENTIN  Take 1 tablet by mouth 2 (two) times daily.     aspirin 325 MG EC tablet  Take 325 mg by mouth 2 (two) times daily.     chlorhexidine 0.12 % solution  Commonly known as:  PERIDEX  Use as directed 15 mLs in the mouth or throat 2 (two) times daily.     cholecalciferol 1000 UNITS tablet  Commonly known as:  VITAMIN D  Take 2,000 Units by mouth daily.     docusate sodium 100 MG capsule  Commonly known as:  COLACE  Take 100 mg by mouth 2 (two) times daily.     HYDROcodone-acetaminophen 5-325 MG per tablet  Commonly known as:  NORCO/VICODIN  Take 1-2 tablets by mouth every 6 (six) hours as needed for moderate pain.     ipratropium-albuterol 0.5-2.5 (3) MG/3ML Soln  Commonly known as:  DUONEB  Take 3 mLs by nebulization every 4 (four) hours as needed.     LORazepam 0.5 MG tablet  Commonly known as:  ATIVAN  Take 0.5 tablets (0.25 mg total) by mouth  every 8 (eight) hours as needed for anxiety.     mirtazapine 7.5 MG tablet  Commonly known as:  REMERON  Take 1 tablet (7.5 mg total) by mouth at bedtime.     polyethylene glycol packet  Commonly known as:  MIRALAX / GLYCOLAX  Take 17 g by mouth daily.     risperiDONE 0.25 MG tablet  Commonly known as:  RISPERDAL  Take 1 tablet (0.25 mg total) by mouth at bedtime.     venlafaxine XR 75 MG 24 hr capsule  Commonly known as:  EFFEXOR-XR  Take 75 mg by mouth daily with breakfast.           Follow-up Information    Follow up with [facility] TOM, MD In 1 week.   Specialty:  Family Medicine   Why:  Follow up appt after recent hospitalization   Contact information:   [address] [place] [address] [phone]        The results of significant diagnostics from this hospitalization (including imaging, microbiology, ancillary and laboratory) are listed below for reference.    Significant Diagnostic Studies: Dg Chest Port 1 View  03/17/2015   CLINICAL DATA:  Pneumonia; c/o cough today; pt has hx of breast CA and dementia; pt was very confused during exam and was not able to follow breathing instructions or positioning requests.  EXAM: PORTABLE CHEST - 1 VIEW  COMPARISON:  03/14/2015.  FINDINGS: Hazy airspace opacities noted on the prior study have improved. There are still bilateral irregular interstitial opacities with a small area of airspace opacity projecting in the right upper lobe. No new lung abnormalities.  No pleural effusion or pneumothorax.  Cardiac silhouette is mildly enlarged. No mediastinal or hilar masses.  Changes from left breast surgery are stable.  Bony thorax is diffusely demineralized but grossly intact.  IMPRESSION: 1. Improvement in patchy areas of bilateral airspace opacity consistent with improved pneumonia. There is an area persistent airspace opacity in the right upper lobe and there is persistent bilateral irregular interstitial thickening. No  new lung abnormalities.   Electronically Signed   By: Lajean Manes M.D.   On: 03/17/2015 21:48   Dg Chest Port 1 View  03/14/2015   CLINICAL DATA:  Cough  EXAM: PORTABLE CHEST - 1 VIEW  COMPARISON:  07/02/2014  FINDINGS: Cardiac shadow is stable. Patchy infiltrates are identified throughout both lungs. No sizable effusion is seen no pneumothorax is noted. The bony structures are within normal limits.  IMPRESSION: Bilateral patchy infiltrates worse on the right than the left. Followup films to resolution are recommended.   Electronically Signed   By: Inez Catalina M.D.   On: 03/14/2015 09:42    Microbiology: Recent Results (from the past 240 hour(s))  Blood culture (routine x 2)     Status: None (Preliminary result)   Collection Time: 03/14/15 10:45 AM  Result Value Ref Range Status   Specimen Description BLOOD LEFT ANTECUBITAL  Final   Special Requests BOTTLES DRAWN AEROBIC AND ANAEROBIC 3.5CC ANA 5CC  Final   Culture   Final           BLOOD CULTURE RECEIVED NO GROWTH TO DATE CULTURE WILL BE HELD FOR 5 DAYS BEFORE ISSUING A FINAL NEGATIVE REPORT Performed at [facility]    Report Status PENDING  Incomplete  Blood culture (routine x 2)     Status: None (Preliminary result)   Collection Time: 03/14/15 10:45 AM  Result Value Ref Range Status   Specimen Description BLOOD RIGHT ANTECUBITAL  Final   Special Requests BOTTLES DRAWN AEROBIC AND ANAEROBIC 5CC  Final   Culture   Final           BLOOD CULTURE RECEIVED NO GROWTH TO DATE CULTURE WILL BE HELD FOR 5 DAYS BEFORE ISSUING A FINAL NEGATIVE REPORT Performed at [facility]    Report Status PENDING  Incomplete  MRSA PCR Screening     Status: None   Collection Time: 03/15/15  3:06 AM  Result Value Ref Range Status   MRSA by PCR NEGATIVE NEGATIVE Final    Comment:        The GeneXpert MRSA Assay (FDA approved for NASAL specimens only), is one component of a comprehensive MRSA colonization surveillance program. It is  not intended to diagnose MRSA infection  nor to guide or monitor treatment for MRSA infections.      Labs: Basic Metabolic Panel:  Recent Labs Lab 03/14/15 0957 03/15/15 0430 03/16/15 0430 03/18/15 0453 03/19/15 0420  NA 140 142 143 135 142  K 3.4* 3.2* 3.1* 3.6 3.8  CL 106 107 106 103 105  CO2 '23 25 27 25 27  ' GLUCOSE 136* 110* 106* 98 95  BUN 26* '22 16 18 17  ' CREATININE 0.68 0.82 0.79 0.76 0.84  CALCIUM 8.5 8.3* 8.3* 8.0* 8.7   Liver Function Tests:  Recent Labs Lab 03/14/15 0957  AST 97*  ALT 73*  ALKPHOS 119*  BILITOT 0.8  PROT 7.0  ALBUMIN 2.7*   No results for input(s): LIPASE, AMYLASE in the last 168 hours. No results for input(s): AMMONIA in the last 168 hours. CBC:  Recent Labs Lab 03/14/15 0957 03/15/15 0430 03/16/15 0430 03/18/15 0453 03/19/15 0420  WBC 12.9* 8.4 7.9 6.5 6.1  NEUTROABS 11.2*  --   --   --   --   HGB 8.6* 7.7* 8.3* 7.8* 8.9*  HCT 27.7* 24.4* 25.6* 24.9* 29.2*  MCV 89.9 90.0 88.9 89.9 90.4  PLT 197 183 192 219 279   Cardiac Enzymes: No results for input(s): CKTOTAL, CKMB, CKMBINDEX, TROPONINI in the last 168 hours. BNP: BNP (last 3 results) No results for input(s): BNP in the last 8760 hours.  ProBNP (last 3 results)  Recent Labs  07/02/14 0455  PROBNP 1610.0*    CBG: No results for input(s): GLUCAP in the last 168 hours.  Time coordinating discharge: Over 30 minutes

## 2015-03-19 NOTE — Clinical Social Work Note (Signed)
CSW received a call from RN that pt was ready for discharge  CSW called and spoke with facility ([facility]) faxed discharge summary, prepared packet and prvided to RN  CSW met with pt's daughter and pt at bedside to discuss transport  CSW called for transport back to facility ([facility])  No further CSW needs  CSW signing off  .Dede Query, LCSW [facility] Clinical Social Worker - Weekend Coverage cell #: [phone]

## 2015-03-19 NOTE — Discharge Instructions (Signed)

## 2015-03-20 DIAGNOSIS — I10 Essential (primary) hypertension: Secondary | ICD-10-CM | POA: Diagnosis not present

## 2015-03-20 DIAGNOSIS — F0391 Unspecified dementia with behavioral disturbance: Secondary | ICD-10-CM | POA: Diagnosis not present

## 2015-03-20 DIAGNOSIS — A419 Sepsis, unspecified organism: Secondary | ICD-10-CM | POA: Diagnosis not present

## 2015-03-20 DIAGNOSIS — J189 Pneumonia, unspecified organism: Secondary | ICD-10-CM | POA: Diagnosis not present

## 2015-03-20 LAB — CULTURE, BLOOD (ROUTINE X 2)
CULTURE: NO GROWTH
Culture: NO GROWTH

## 2015-03-21 DIAGNOSIS — F0391 Unspecified dementia with behavioral disturbance: Secondary | ICD-10-CM | POA: Diagnosis not present

## 2015-03-21 DIAGNOSIS — J189 Pneumonia, unspecified organism: Secondary | ICD-10-CM | POA: Diagnosis not present

## 2015-03-21 DIAGNOSIS — A419 Sepsis, unspecified organism: Secondary | ICD-10-CM | POA: Diagnosis not present

## 2015-03-21 DIAGNOSIS — F341 Dysthymic disorder: Secondary | ICD-10-CM | POA: Diagnosis not present

## 2015-03-23 DIAGNOSIS — R945 Abnormal results of liver function studies: Secondary | ICD-10-CM | POA: Diagnosis not present

## 2015-03-23 DIAGNOSIS — F0391 Unspecified dementia with behavioral disturbance: Secondary | ICD-10-CM | POA: Diagnosis not present

## 2015-03-23 DIAGNOSIS — J189 Pneumonia, unspecified organism: Secondary | ICD-10-CM | POA: Diagnosis not present

## 2015-03-23 DIAGNOSIS — D649 Anemia, unspecified: Secondary | ICD-10-CM | POA: Diagnosis not present

## 2015-04-06 DIAGNOSIS — J189 Pneumonia, unspecified organism: Secondary | ICD-10-CM | POA: Diagnosis not present

## 2015-04-06 DIAGNOSIS — F0391 Unspecified dementia with behavioral disturbance: Secondary | ICD-10-CM | POA: Diagnosis not present

## 2015-04-06 DIAGNOSIS — R4182 Altered mental status, unspecified: Secondary | ICD-10-CM | POA: Diagnosis not present

## 2015-04-06 DIAGNOSIS — R0989 Other specified symptoms and signs involving the circulatory and respiratory systems: Secondary | ICD-10-CM | POA: Diagnosis not present

## 2015-04-07 DIAGNOSIS — R0989 Other specified symptoms and signs involving the circulatory and respiratory systems: Secondary | ICD-10-CM | POA: Diagnosis not present

## 2015-04-07 DIAGNOSIS — J189 Pneumonia, unspecified organism: Secondary | ICD-10-CM | POA: Diagnosis not present

## 2015-04-11 DIAGNOSIS — M6281 Muscle weakness (generalized): Secondary | ICD-10-CM | POA: Diagnosis not present

## 2015-04-11 DIAGNOSIS — D649 Anemia, unspecified: Secondary | ICD-10-CM | POA: Diagnosis not present

## 2015-04-11 DIAGNOSIS — R4182 Altered mental status, unspecified: Secondary | ICD-10-CM | POA: Diagnosis not present

## 2015-04-11 DIAGNOSIS — J189 Pneumonia, unspecified organism: Secondary | ICD-10-CM | POA: Diagnosis not present

## 2015-04-15 IMAGING — CR DG FOREARM 2V*R*
3 series · 3 of 3 positions shown · non-contrast
Comparison: Prior radiograph from 06/30/2014

CLINICAL DATA: Initial evaluation for fall, now with right forearm
pain. Abrasion and midshaft posterior surface. Recent radial head
fracture.

EXAM:
RIGHT FOREARM - 2 VIEW

[x forearm ap right]
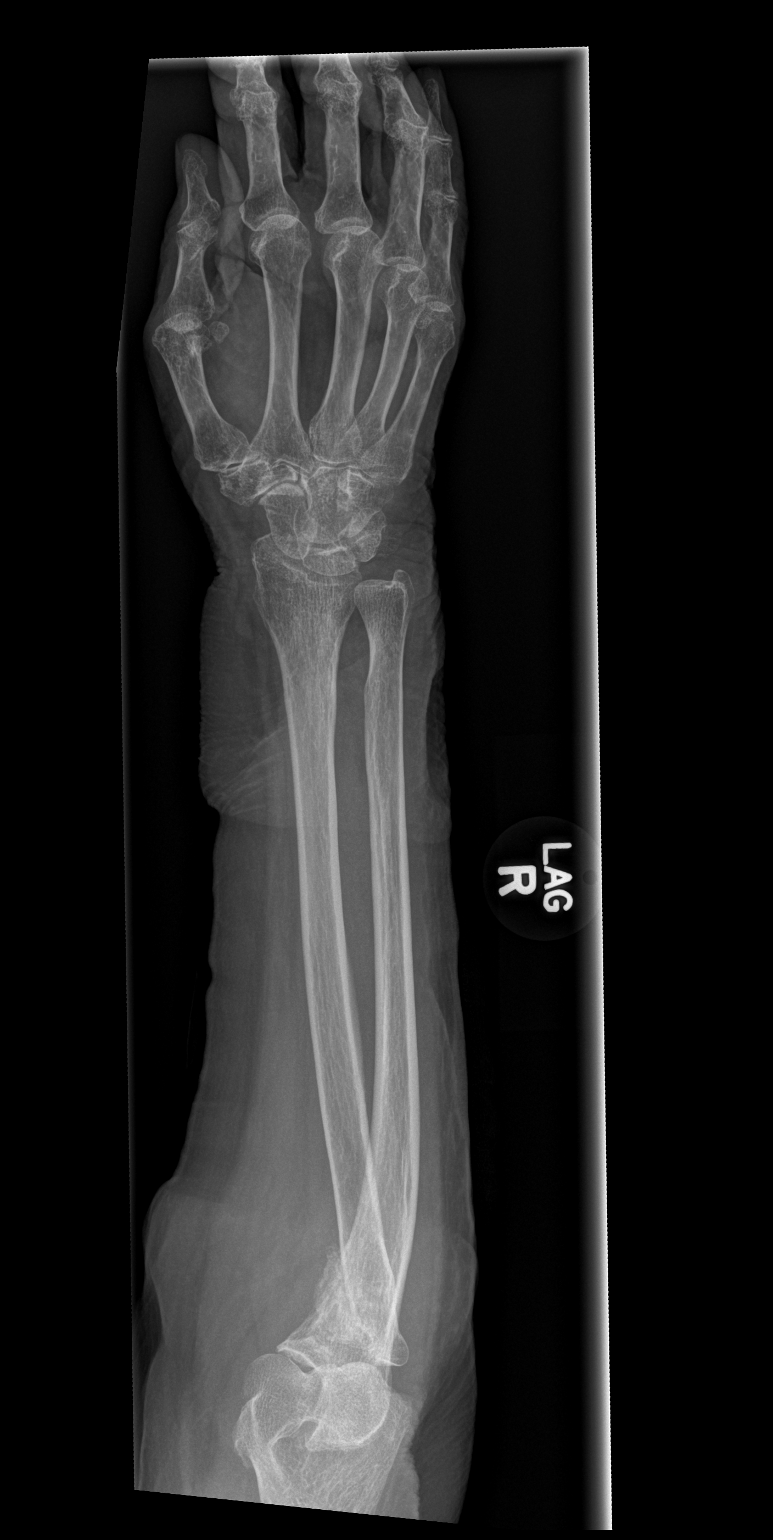

[x forearm lat right (1 of 2)]
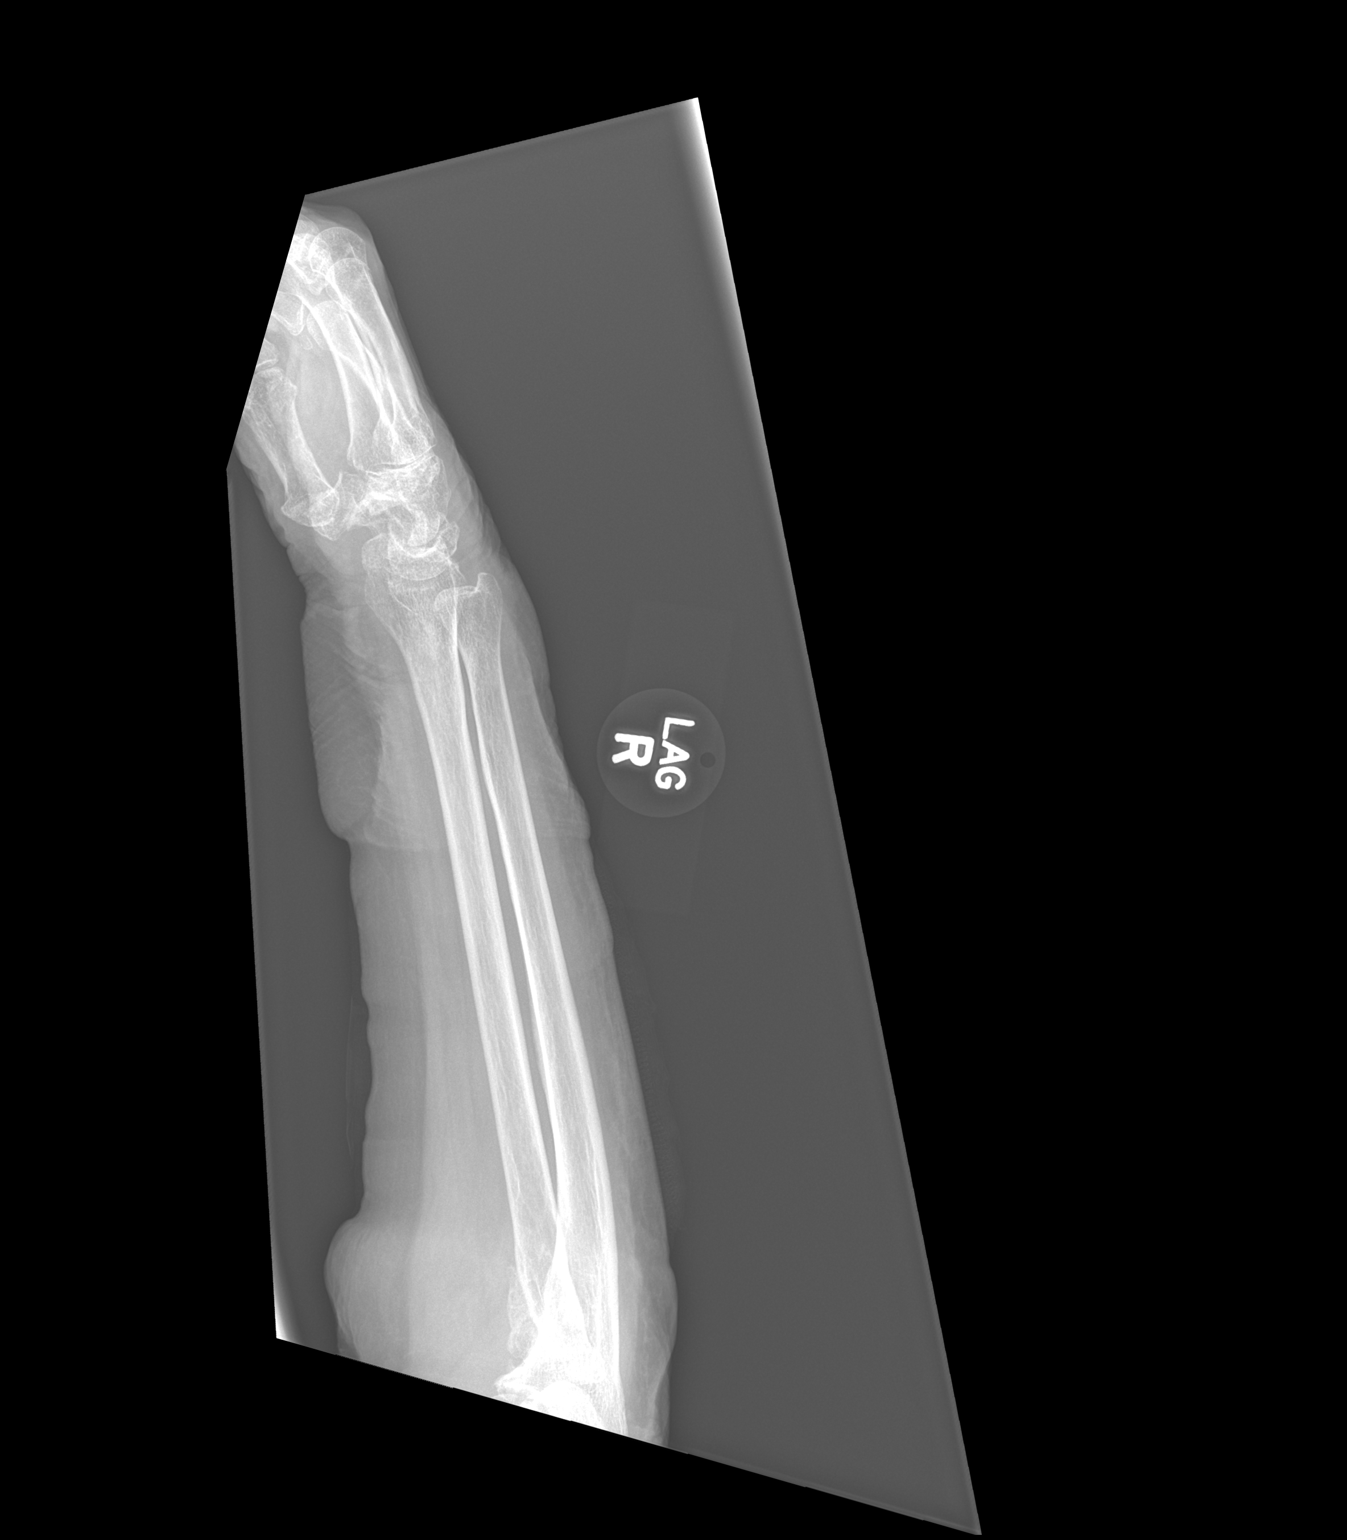

[x forearm lat right (2 of 2)]
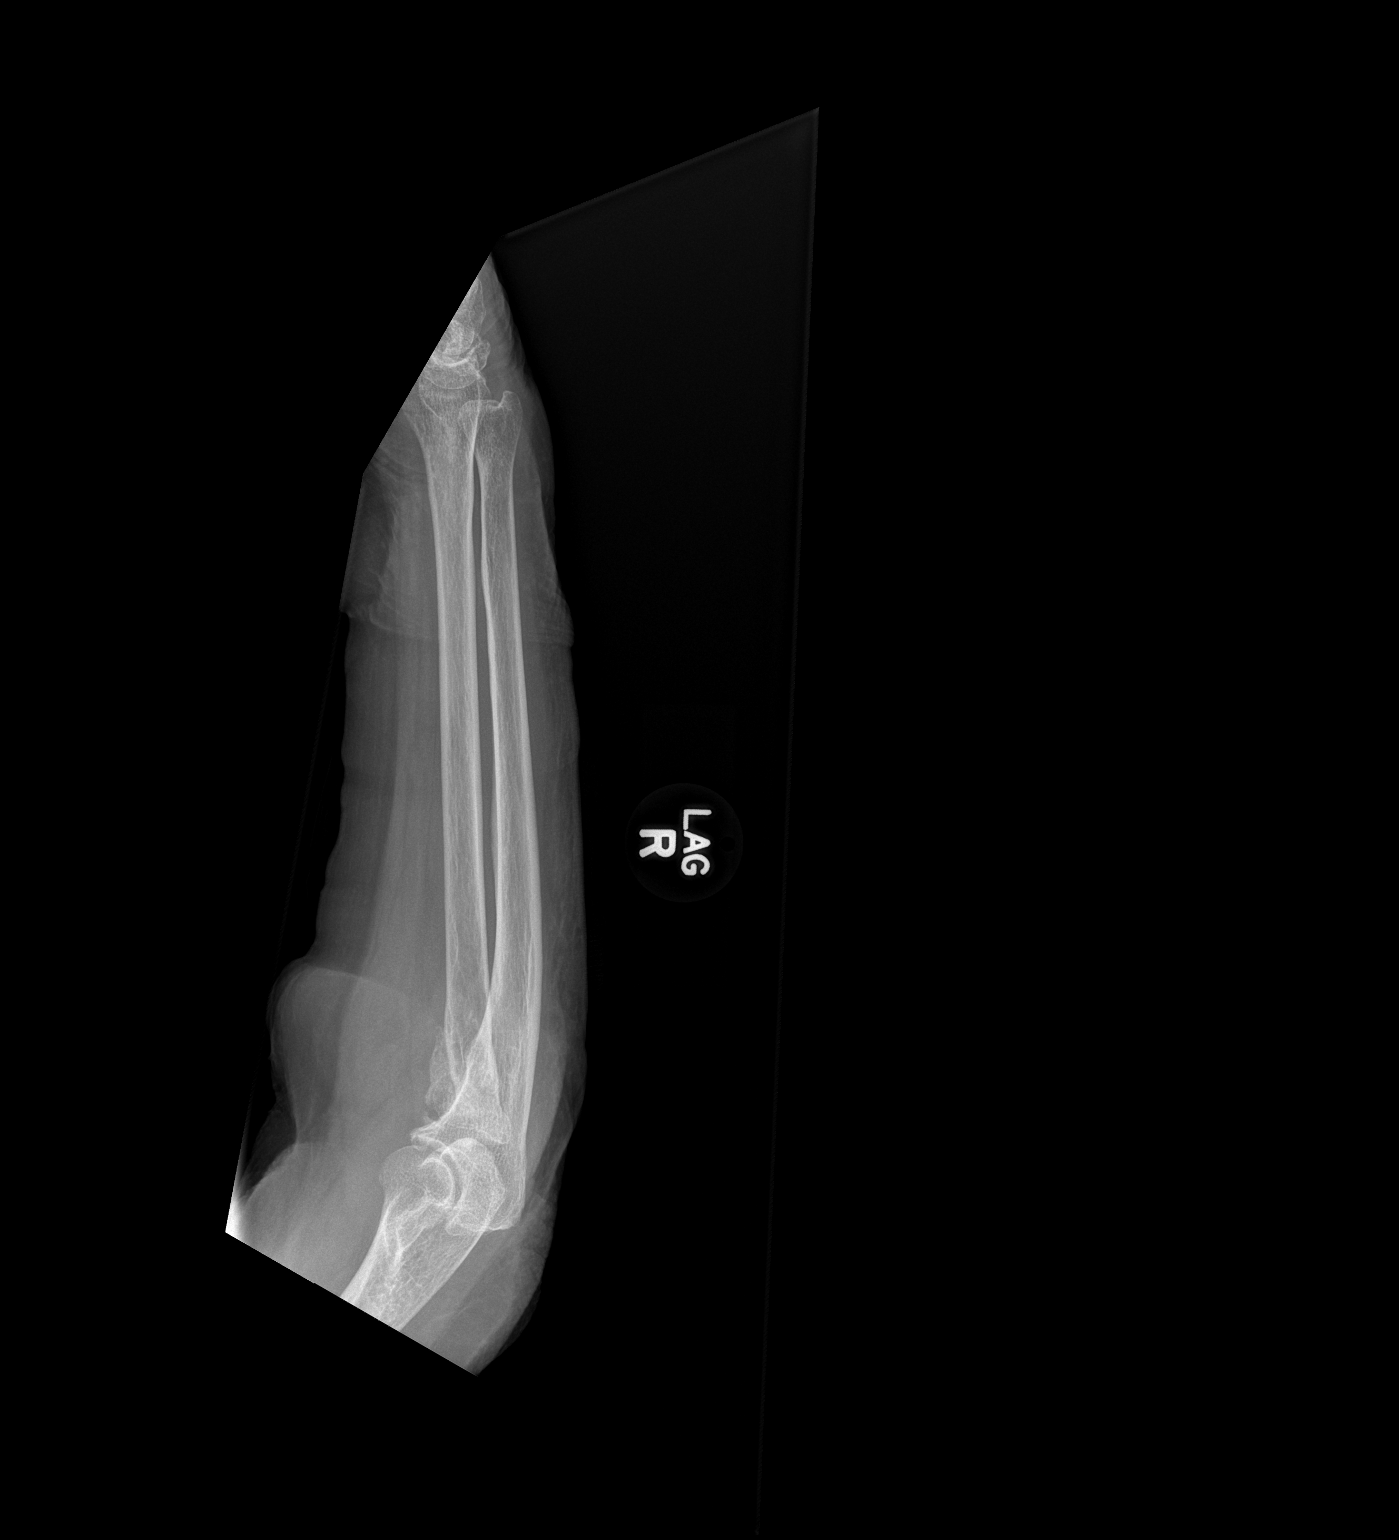

[3 of 3 positions shown; findings below may reference images not displayed]

FINDINGS: No acute fracture or dislocation. Osseous irregularity about the
radial head noted, like related to recent radial head fracture.
There remains slight impaction at this site. Overall alignment is
stable relative to prior radiograph. No soft tissue abnormality. No
radiopaque foreign body. Diffuse osteopenia noted.
IMPRESSION: 1. No acute fracture or dislocation.
2. Deformity about the radial head with slight impaction, compatible
with recent radial head fracture. Overall alignment about this
fracture is not significantly changed relative to 06/30/2014.
3. Osteopenia.

## 2015-05-04 DIAGNOSIS — D649 Anemia, unspecified: Secondary | ICD-10-CM | POA: Diagnosis not present

## 2015-05-04 DIAGNOSIS — I10 Essential (primary) hypertension: Secondary | ICD-10-CM | POA: Diagnosis not present

## 2015-05-04 DIAGNOSIS — F329 Major depressive disorder, single episode, unspecified: Secondary | ICD-10-CM | POA: Diagnosis not present

## 2015-05-04 DIAGNOSIS — S72001D Fracture of unspecified part of neck of right femur, subsequent encounter for closed fracture with routine healing: Secondary | ICD-10-CM | POA: Diagnosis not present

## 2015-05-04 DIAGNOSIS — F0391 Unspecified dementia with behavioral disturbance: Secondary | ICD-10-CM | POA: Diagnosis not present

## 2015-06-26 ENCOUNTER — Other Ambulatory Visit: Payer: Self-pay

## 2015-08-04 DIAGNOSIS — M79674 Pain in right toe(s): Secondary | ICD-10-CM | POA: Diagnosis not present

## 2015-08-04 DIAGNOSIS — M79675 Pain in left toe(s): Secondary | ICD-10-CM | POA: Diagnosis not present

## 2015-08-04 DIAGNOSIS — B351 Tinea unguium: Secondary | ICD-10-CM | POA: Diagnosis not present

## 2015-08-04 DIAGNOSIS — I70203 Unspecified atherosclerosis of native arteries of extremities, bilateral legs: Secondary | ICD-10-CM | POA: Diagnosis not present

## 2015-08-07 DIAGNOSIS — J189 Pneumonia, unspecified organism: Secondary | ICD-10-CM | POA: Diagnosis not present

## 2015-08-07 DIAGNOSIS — F331 Major depressive disorder, recurrent, moderate: Secondary | ICD-10-CM | POA: Diagnosis not present

## 2015-08-07 DIAGNOSIS — M84459D Pathological fracture, hip, unspecified, subsequent encounter for fracture with routine healing: Secondary | ICD-10-CM | POA: Diagnosis not present

## 2015-08-07 DIAGNOSIS — F0390 Unspecified dementia without behavioral disturbance: Secondary | ICD-10-CM | POA: Diagnosis not present

## 2015-08-07 DIAGNOSIS — D6489 Other specified anemias: Secondary | ICD-10-CM | POA: Diagnosis not present

## 2015-08-07 DIAGNOSIS — I10 Essential (primary) hypertension: Secondary | ICD-10-CM | POA: Diagnosis not present

## 2015-08-08 DIAGNOSIS — R05 Cough: Secondary | ICD-10-CM | POA: Diagnosis not present

## 2015-08-08 DIAGNOSIS — R062 Wheezing: Secondary | ICD-10-CM | POA: Diagnosis not present

## 2015-08-14 DIAGNOSIS — Z961 Presence of intraocular lens: Secondary | ICD-10-CM | POA: Diagnosis not present

## 2015-08-24 ENCOUNTER — Telehealth: Payer: Self-pay | Admitting: Family Medicine

## 2015-08-24 DIAGNOSIS — J3489 Other specified disorders of nose and nasal sinuses: Secondary | ICD-10-CM

## 2015-08-24 NOTE — Telephone Encounter (Signed)
Likely needs derm referral for complete excision

## 2015-08-24 NOTE — Telephone Encounter (Signed)
Daughter made aware and referral initiated

## 2015-08-24 NOTE — Telephone Encounter (Signed)
Patient now lives at [facility].  Cancerous growth to nose is growing back. (you had treated in 09/2013)   Dr Forde Dandy who sees here there asked family to contact you for your advise.  Family wants to know if they need to bring her to see you again or what do you recommend.  Daughter says lesion has been gradually growing for 2 months about size of fingernail.  Large blistered looking growth.

## 2015-08-30 DIAGNOSIS — D485 Neoplasm of uncertain behavior of skin: Secondary | ICD-10-CM | POA: Diagnosis not present

## 2015-08-30 DIAGNOSIS — C44321 Squamous cell carcinoma of skin of nose: Secondary | ICD-10-CM | POA: Diagnosis not present

## 2015-09-21 DIAGNOSIS — F0390 Unspecified dementia without behavioral disturbance: Secondary | ICD-10-CM | POA: Diagnosis not present

## 2015-09-21 DIAGNOSIS — E119 Type 2 diabetes mellitus without complications: Secondary | ICD-10-CM | POA: Diagnosis not present

## 2015-09-21 DIAGNOSIS — I10 Essential (primary) hypertension: Secondary | ICD-10-CM | POA: Diagnosis not present

## 2015-09-21 DIAGNOSIS — F341 Dysthymic disorder: Secondary | ICD-10-CM | POA: Diagnosis not present

## 2015-09-21 DIAGNOSIS — D649 Anemia, unspecified: Secondary | ICD-10-CM | POA: Diagnosis not present

## 2015-09-25 DIAGNOSIS — R319 Hematuria, unspecified: Secondary | ICD-10-CM | POA: Diagnosis not present

## 2015-09-25 DIAGNOSIS — N390 Urinary tract infection, site not specified: Secondary | ICD-10-CM | POA: Diagnosis not present

## 2015-10-13 DIAGNOSIS — I2510 Atherosclerotic heart disease of native coronary artery without angina pectoris: Secondary | ICD-10-CM | POA: Diagnosis not present

## 2015-10-13 DIAGNOSIS — M79671 Pain in right foot: Secondary | ICD-10-CM | POA: Diagnosis not present

## 2015-10-13 DIAGNOSIS — B351 Tinea unguium: Secondary | ICD-10-CM | POA: Diagnosis not present

## 2015-10-13 DIAGNOSIS — M79672 Pain in left foot: Secondary | ICD-10-CM | POA: Diagnosis not present

## 2015-10-18 DIAGNOSIS — F332 Major depressive disorder, recurrent severe without psychotic features: Secondary | ICD-10-CM | POA: Diagnosis not present

## 2015-10-18 DIAGNOSIS — I10 Essential (primary) hypertension: Secondary | ICD-10-CM | POA: Diagnosis not present

## 2015-10-18 DIAGNOSIS — D649 Anemia, unspecified: Secondary | ICD-10-CM | POA: Diagnosis not present

## 2015-10-18 DIAGNOSIS — F0390 Unspecified dementia without behavioral disturbance: Secondary | ICD-10-CM | POA: Diagnosis not present

## 2015-10-18 DIAGNOSIS — M84459D Pathological fracture, hip, unspecified, subsequent encounter for fracture with routine healing: Secondary | ICD-10-CM | POA: Diagnosis not present

## 2015-11-01 DIAGNOSIS — L821 Other seborrheic keratosis: Secondary | ICD-10-CM | POA: Diagnosis not present

## 2015-11-01 DIAGNOSIS — Z85828 Personal history of other malignant neoplasm of skin: Secondary | ICD-10-CM | POA: Diagnosis not present

## 2015-11-01 DIAGNOSIS — L720 Epidermal cyst: Secondary | ICD-10-CM | POA: Diagnosis not present

## 2015-12-13 DIAGNOSIS — F339 Major depressive disorder, recurrent, unspecified: Secondary | ICD-10-CM | POA: Diagnosis not present

## 2015-12-13 DIAGNOSIS — I10 Essential (primary) hypertension: Secondary | ICD-10-CM | POA: Diagnosis not present

## 2015-12-13 DIAGNOSIS — R7301 Impaired fasting glucose: Secondary | ICD-10-CM | POA: Diagnosis not present

## 2015-12-13 DIAGNOSIS — F0390 Unspecified dementia without behavioral disturbance: Secondary | ICD-10-CM | POA: Diagnosis not present

## 2015-12-13 DIAGNOSIS — D649 Anemia, unspecified: Secondary | ICD-10-CM | POA: Diagnosis not present

## 2015-12-15 DIAGNOSIS — E559 Vitamin D deficiency, unspecified: Secondary | ICD-10-CM | POA: Diagnosis not present

## 2016-01-04 DIAGNOSIS — D649 Anemia, unspecified: Secondary | ICD-10-CM | POA: Diagnosis not present

## 2016-01-04 DIAGNOSIS — I10 Essential (primary) hypertension: Secondary | ICD-10-CM | POA: Diagnosis not present

## 2016-01-04 DIAGNOSIS — E785 Hyperlipidemia, unspecified: Secondary | ICD-10-CM | POA: Diagnosis not present

## 2016-01-05 DIAGNOSIS — M79671 Pain in right foot: Secondary | ICD-10-CM | POA: Diagnosis not present

## 2016-01-05 DIAGNOSIS — B351 Tinea unguium: Secondary | ICD-10-CM | POA: Diagnosis not present

## 2016-01-05 DIAGNOSIS — I2510 Atherosclerotic heart disease of native coronary artery without angina pectoris: Secondary | ICD-10-CM | POA: Diagnosis not present

## 2016-01-05 DIAGNOSIS — M79672 Pain in left foot: Secondary | ICD-10-CM | POA: Diagnosis not present

## 2016-01-14 DIAGNOSIS — R05 Cough: Secondary | ICD-10-CM | POA: Diagnosis not present

## 2016-01-14 DIAGNOSIS — R062 Wheezing: Secondary | ICD-10-CM | POA: Diagnosis not present

## 2016-01-15 DIAGNOSIS — J189 Pneumonia, unspecified organism: Secondary | ICD-10-CM | POA: Diagnosis not present

## 2016-03-20 DIAGNOSIS — N390 Urinary tract infection, site not specified: Secondary | ICD-10-CM | POA: Diagnosis not present

## 2016-03-25 DIAGNOSIS — E441 Mild protein-calorie malnutrition: Secondary | ICD-10-CM | POA: Diagnosis not present

## 2016-03-25 DIAGNOSIS — F0390 Unspecified dementia without behavioral disturbance: Secondary | ICD-10-CM | POA: Diagnosis not present

## 2016-03-25 DIAGNOSIS — F3340 Major depressive disorder, recurrent, in remission, unspecified: Secondary | ICD-10-CM | POA: Diagnosis not present

## 2016-03-25 DIAGNOSIS — F22 Delusional disorders: Secondary | ICD-10-CM | POA: Diagnosis not present

## 2016-04-12 DIAGNOSIS — I2510 Atherosclerotic heart disease of native coronary artery without angina pectoris: Secondary | ICD-10-CM | POA: Diagnosis not present

## 2016-04-12 DIAGNOSIS — M79672 Pain in left foot: Secondary | ICD-10-CM | POA: Diagnosis not present

## 2016-04-12 DIAGNOSIS — M79671 Pain in right foot: Secondary | ICD-10-CM | POA: Diagnosis not present

## 2016-04-12 DIAGNOSIS — B351 Tinea unguium: Secondary | ICD-10-CM | POA: Diagnosis not present

## 2016-04-24 DIAGNOSIS — E119 Type 2 diabetes mellitus without complications: Secondary | ICD-10-CM | POA: Diagnosis not present

## 2016-04-24 DIAGNOSIS — D649 Anemia, unspecified: Secondary | ICD-10-CM | POA: Diagnosis not present

## 2016-05-01 DIAGNOSIS — D649 Anemia, unspecified: Secondary | ICD-10-CM | POA: Diagnosis not present

## 2016-05-01 DIAGNOSIS — F339 Major depressive disorder, recurrent, unspecified: Secondary | ICD-10-CM | POA: Diagnosis not present

## 2016-05-01 DIAGNOSIS — F0390 Unspecified dementia without behavioral disturbance: Secondary | ICD-10-CM | POA: Diagnosis not present

## 2016-05-01 DIAGNOSIS — I10 Essential (primary) hypertension: Secondary | ICD-10-CM | POA: Diagnosis not present

## 2016-05-01 DIAGNOSIS — Z853 Personal history of malignant neoplasm of breast: Secondary | ICD-10-CM | POA: Diagnosis not present

## 2016-05-01 DIAGNOSIS — E441 Mild protein-calorie malnutrition: Secondary | ICD-10-CM | POA: Diagnosis not present

## 2016-05-06 DIAGNOSIS — Z9181 History of falling: Secondary | ICD-10-CM | POA: Diagnosis not present

## 2016-05-06 DIAGNOSIS — I10 Essential (primary) hypertension: Secondary | ICD-10-CM | POA: Diagnosis not present

## 2016-05-06 DIAGNOSIS — R41841 Cognitive communication deficit: Secondary | ICD-10-CM | POA: Diagnosis not present

## 2016-05-06 DIAGNOSIS — J189 Pneumonia, unspecified organism: Secondary | ICD-10-CM | POA: Diagnosis not present

## 2016-05-06 DIAGNOSIS — M6281 Muscle weakness (generalized): Secondary | ICD-10-CM | POA: Diagnosis not present

## 2016-05-06 DIAGNOSIS — F0390 Unspecified dementia without behavioral disturbance: Secondary | ICD-10-CM | POA: Diagnosis not present

## 2016-05-07 DIAGNOSIS — Z9181 History of falling: Secondary | ICD-10-CM | POA: Diagnosis not present

## 2016-05-07 DIAGNOSIS — I10 Essential (primary) hypertension: Secondary | ICD-10-CM | POA: Diagnosis not present

## 2016-05-07 DIAGNOSIS — R41841 Cognitive communication deficit: Secondary | ICD-10-CM | POA: Diagnosis not present

## 2016-05-07 DIAGNOSIS — M6281 Muscle weakness (generalized): Secondary | ICD-10-CM | POA: Diagnosis not present

## 2016-05-07 DIAGNOSIS — F0390 Unspecified dementia without behavioral disturbance: Secondary | ICD-10-CM | POA: Diagnosis not present

## 2016-05-07 DIAGNOSIS — J189 Pneumonia, unspecified organism: Secondary | ICD-10-CM | POA: Diagnosis not present

## 2016-05-08 DIAGNOSIS — Z9181 History of falling: Secondary | ICD-10-CM | POA: Diagnosis not present

## 2016-05-08 DIAGNOSIS — J189 Pneumonia, unspecified organism: Secondary | ICD-10-CM | POA: Diagnosis not present

## 2016-05-08 DIAGNOSIS — I10 Essential (primary) hypertension: Secondary | ICD-10-CM | POA: Diagnosis not present

## 2016-05-08 DIAGNOSIS — F0390 Unspecified dementia without behavioral disturbance: Secondary | ICD-10-CM | POA: Diagnosis not present

## 2016-05-08 DIAGNOSIS — R41841 Cognitive communication deficit: Secondary | ICD-10-CM | POA: Diagnosis not present

## 2016-05-08 DIAGNOSIS — M6281 Muscle weakness (generalized): Secondary | ICD-10-CM | POA: Diagnosis not present

## 2016-05-09 DIAGNOSIS — M6281 Muscle weakness (generalized): Secondary | ICD-10-CM | POA: Diagnosis not present

## 2016-05-09 DIAGNOSIS — F0390 Unspecified dementia without behavioral disturbance: Secondary | ICD-10-CM | POA: Diagnosis not present

## 2016-05-09 DIAGNOSIS — J189 Pneumonia, unspecified organism: Secondary | ICD-10-CM | POA: Diagnosis not present

## 2016-05-09 DIAGNOSIS — Z9181 History of falling: Secondary | ICD-10-CM | POA: Diagnosis not present

## 2016-05-09 DIAGNOSIS — R41841 Cognitive communication deficit: Secondary | ICD-10-CM | POA: Diagnosis not present

## 2016-05-09 DIAGNOSIS — I10 Essential (primary) hypertension: Secondary | ICD-10-CM | POA: Diagnosis not present

## 2016-05-10 DIAGNOSIS — R41841 Cognitive communication deficit: Secondary | ICD-10-CM | POA: Diagnosis not present

## 2016-05-10 DIAGNOSIS — M6281 Muscle weakness (generalized): Secondary | ICD-10-CM | POA: Diagnosis not present

## 2016-05-10 DIAGNOSIS — F0390 Unspecified dementia without behavioral disturbance: Secondary | ICD-10-CM | POA: Diagnosis not present

## 2016-05-10 DIAGNOSIS — I10 Essential (primary) hypertension: Secondary | ICD-10-CM | POA: Diagnosis not present

## 2016-05-10 DIAGNOSIS — J189 Pneumonia, unspecified organism: Secondary | ICD-10-CM | POA: Diagnosis not present

## 2016-05-10 DIAGNOSIS — Z9181 History of falling: Secondary | ICD-10-CM | POA: Diagnosis not present

## 2016-05-14 DIAGNOSIS — M6281 Muscle weakness (generalized): Secondary | ICD-10-CM | POA: Diagnosis not present

## 2016-05-14 DIAGNOSIS — Z9181 History of falling: Secondary | ICD-10-CM | POA: Diagnosis not present

## 2016-05-14 DIAGNOSIS — R41841 Cognitive communication deficit: Secondary | ICD-10-CM | POA: Diagnosis not present

## 2016-05-14 DIAGNOSIS — J189 Pneumonia, unspecified organism: Secondary | ICD-10-CM | POA: Diagnosis not present

## 2016-05-14 DIAGNOSIS — F0390 Unspecified dementia without behavioral disturbance: Secondary | ICD-10-CM | POA: Diagnosis not present

## 2016-05-14 DIAGNOSIS — I10 Essential (primary) hypertension: Secondary | ICD-10-CM | POA: Diagnosis not present

## 2016-05-15 DIAGNOSIS — Z9181 History of falling: Secondary | ICD-10-CM | POA: Diagnosis not present

## 2016-05-15 DIAGNOSIS — M6281 Muscle weakness (generalized): Secondary | ICD-10-CM | POA: Diagnosis not present

## 2016-05-15 DIAGNOSIS — I10 Essential (primary) hypertension: Secondary | ICD-10-CM | POA: Diagnosis not present

## 2016-05-15 DIAGNOSIS — R41841 Cognitive communication deficit: Secondary | ICD-10-CM | POA: Diagnosis not present

## 2016-05-15 DIAGNOSIS — J189 Pneumonia, unspecified organism: Secondary | ICD-10-CM | POA: Diagnosis not present

## 2016-05-15 DIAGNOSIS — F0390 Unspecified dementia without behavioral disturbance: Secondary | ICD-10-CM | POA: Diagnosis not present

## 2016-05-17 DIAGNOSIS — Z9181 History of falling: Secondary | ICD-10-CM | POA: Diagnosis not present

## 2016-05-17 DIAGNOSIS — R41841 Cognitive communication deficit: Secondary | ICD-10-CM | POA: Diagnosis not present

## 2016-05-17 DIAGNOSIS — I10 Essential (primary) hypertension: Secondary | ICD-10-CM | POA: Diagnosis not present

## 2016-05-17 DIAGNOSIS — F0390 Unspecified dementia without behavioral disturbance: Secondary | ICD-10-CM | POA: Diagnosis not present

## 2016-05-17 DIAGNOSIS — M6281 Muscle weakness (generalized): Secondary | ICD-10-CM | POA: Diagnosis not present

## 2016-05-17 DIAGNOSIS — J189 Pneumonia, unspecified organism: Secondary | ICD-10-CM | POA: Diagnosis not present

## 2016-05-20 DIAGNOSIS — J189 Pneumonia, unspecified organism: Secondary | ICD-10-CM | POA: Diagnosis not present

## 2016-05-20 DIAGNOSIS — I10 Essential (primary) hypertension: Secondary | ICD-10-CM | POA: Diagnosis not present

## 2016-05-20 DIAGNOSIS — Z9181 History of falling: Secondary | ICD-10-CM | POA: Diagnosis not present

## 2016-05-20 DIAGNOSIS — F0390 Unspecified dementia without behavioral disturbance: Secondary | ICD-10-CM | POA: Diagnosis not present

## 2016-05-20 DIAGNOSIS — M6281 Muscle weakness (generalized): Secondary | ICD-10-CM | POA: Diagnosis not present

## 2016-05-20 DIAGNOSIS — R41841 Cognitive communication deficit: Secondary | ICD-10-CM | POA: Diagnosis not present

## 2016-05-22 DIAGNOSIS — I10 Essential (primary) hypertension: Secondary | ICD-10-CM | POA: Diagnosis not present

## 2016-05-22 DIAGNOSIS — Z9181 History of falling: Secondary | ICD-10-CM | POA: Diagnosis not present

## 2016-05-22 DIAGNOSIS — F0390 Unspecified dementia without behavioral disturbance: Secondary | ICD-10-CM | POA: Diagnosis not present

## 2016-05-22 DIAGNOSIS — M6281 Muscle weakness (generalized): Secondary | ICD-10-CM | POA: Diagnosis not present

## 2016-05-22 DIAGNOSIS — J189 Pneumonia, unspecified organism: Secondary | ICD-10-CM | POA: Diagnosis not present

## 2016-05-22 DIAGNOSIS — R41841 Cognitive communication deficit: Secondary | ICD-10-CM | POA: Diagnosis not present

## 2016-05-23 DIAGNOSIS — Z9181 History of falling: Secondary | ICD-10-CM | POA: Diagnosis not present

## 2016-05-23 DIAGNOSIS — R41841 Cognitive communication deficit: Secondary | ICD-10-CM | POA: Diagnosis not present

## 2016-05-23 DIAGNOSIS — I10 Essential (primary) hypertension: Secondary | ICD-10-CM | POA: Diagnosis not present

## 2016-05-23 DIAGNOSIS — J189 Pneumonia, unspecified organism: Secondary | ICD-10-CM | POA: Diagnosis not present

## 2016-05-23 DIAGNOSIS — F0390 Unspecified dementia without behavioral disturbance: Secondary | ICD-10-CM | POA: Diagnosis not present

## 2016-05-23 DIAGNOSIS — M6281 Muscle weakness (generalized): Secondary | ICD-10-CM | POA: Diagnosis not present

## 2016-05-24 DIAGNOSIS — I10 Essential (primary) hypertension: Secondary | ICD-10-CM | POA: Diagnosis not present

## 2016-05-24 DIAGNOSIS — F0390 Unspecified dementia without behavioral disturbance: Secondary | ICD-10-CM | POA: Diagnosis not present

## 2016-05-24 DIAGNOSIS — E441 Mild protein-calorie malnutrition: Secondary | ICD-10-CM | POA: Diagnosis not present

## 2016-05-24 DIAGNOSIS — D649 Anemia, unspecified: Secondary | ICD-10-CM | POA: Diagnosis not present

## 2016-05-24 DIAGNOSIS — F339 Major depressive disorder, recurrent, unspecified: Secondary | ICD-10-CM | POA: Diagnosis not present

## 2016-05-27 DIAGNOSIS — R41841 Cognitive communication deficit: Secondary | ICD-10-CM | POA: Diagnosis not present

## 2016-05-27 DIAGNOSIS — F0390 Unspecified dementia without behavioral disturbance: Secondary | ICD-10-CM | POA: Diagnosis not present

## 2016-05-27 DIAGNOSIS — Z9181 History of falling: Secondary | ICD-10-CM | POA: Diagnosis not present

## 2016-05-27 DIAGNOSIS — M6281 Muscle weakness (generalized): Secondary | ICD-10-CM | POA: Diagnosis not present

## 2016-05-27 DIAGNOSIS — I10 Essential (primary) hypertension: Secondary | ICD-10-CM | POA: Diagnosis not present

## 2016-05-27 DIAGNOSIS — J189 Pneumonia, unspecified organism: Secondary | ICD-10-CM | POA: Diagnosis not present

## 2016-05-28 DIAGNOSIS — I10 Essential (primary) hypertension: Secondary | ICD-10-CM | POA: Diagnosis not present

## 2016-05-28 DIAGNOSIS — Z9181 History of falling: Secondary | ICD-10-CM | POA: Diagnosis not present

## 2016-05-28 DIAGNOSIS — F0390 Unspecified dementia without behavioral disturbance: Secondary | ICD-10-CM | POA: Diagnosis not present

## 2016-05-28 DIAGNOSIS — M6281 Muscle weakness (generalized): Secondary | ICD-10-CM | POA: Diagnosis not present

## 2016-05-28 DIAGNOSIS — J189 Pneumonia, unspecified organism: Secondary | ICD-10-CM | POA: Diagnosis not present

## 2016-05-28 DIAGNOSIS — R41841 Cognitive communication deficit: Secondary | ICD-10-CM | POA: Diagnosis not present

## 2016-05-29 DIAGNOSIS — J189 Pneumonia, unspecified organism: Secondary | ICD-10-CM | POA: Diagnosis not present

## 2016-05-29 DIAGNOSIS — R41841 Cognitive communication deficit: Secondary | ICD-10-CM | POA: Diagnosis not present

## 2016-05-29 DIAGNOSIS — I10 Essential (primary) hypertension: Secondary | ICD-10-CM | POA: Diagnosis not present

## 2016-05-29 DIAGNOSIS — F0390 Unspecified dementia without behavioral disturbance: Secondary | ICD-10-CM | POA: Diagnosis not present

## 2016-05-29 DIAGNOSIS — M6281 Muscle weakness (generalized): Secondary | ICD-10-CM | POA: Diagnosis not present

## 2016-05-29 DIAGNOSIS — Z9181 History of falling: Secondary | ICD-10-CM | POA: Diagnosis not present

## 2016-05-30 DIAGNOSIS — F0390 Unspecified dementia without behavioral disturbance: Secondary | ICD-10-CM | POA: Diagnosis not present

## 2016-05-30 DIAGNOSIS — R41841 Cognitive communication deficit: Secondary | ICD-10-CM | POA: Diagnosis not present

## 2016-06-08 DIAGNOSIS — R05 Cough: Secondary | ICD-10-CM | POA: Diagnosis not present

## 2016-06-10 DIAGNOSIS — J189 Pneumonia, unspecified organism: Secondary | ICD-10-CM | POA: Diagnosis not present

## 2016-07-03 DIAGNOSIS — D649 Anemia, unspecified: Secondary | ICD-10-CM | POA: Diagnosis not present

## 2016-07-03 DIAGNOSIS — E559 Vitamin D deficiency, unspecified: Secondary | ICD-10-CM | POA: Diagnosis not present

## 2016-07-03 DIAGNOSIS — E119 Type 2 diabetes mellitus without complications: Secondary | ICD-10-CM | POA: Diagnosis not present

## 2016-07-03 DIAGNOSIS — E039 Hypothyroidism, unspecified: Secondary | ICD-10-CM | POA: Diagnosis not present

## 2016-07-04 DIAGNOSIS — Z79899 Other long term (current) drug therapy: Secondary | ICD-10-CM | POA: Diagnosis not present

## 2016-07-04 DIAGNOSIS — E785 Hyperlipidemia, unspecified: Secondary | ICD-10-CM | POA: Diagnosis not present

## 2016-07-04 DIAGNOSIS — E559 Vitamin D deficiency, unspecified: Secondary | ICD-10-CM | POA: Diagnosis not present

## 2016-07-04 DIAGNOSIS — D649 Anemia, unspecified: Secondary | ICD-10-CM | POA: Diagnosis not present

## 2016-07-04 DIAGNOSIS — I10 Essential (primary) hypertension: Secondary | ICD-10-CM | POA: Diagnosis not present

## 2016-07-04 DIAGNOSIS — E039 Hypothyroidism, unspecified: Secondary | ICD-10-CM | POA: Diagnosis not present

## 2016-07-29 DIAGNOSIS — M79672 Pain in left foot: Secondary | ICD-10-CM | POA: Diagnosis not present

## 2016-07-29 DIAGNOSIS — B351 Tinea unguium: Secondary | ICD-10-CM | POA: Diagnosis not present

## 2016-07-29 DIAGNOSIS — M79671 Pain in right foot: Secondary | ICD-10-CM | POA: Diagnosis not present

## 2016-08-07 DIAGNOSIS — L821 Other seborrheic keratosis: Secondary | ICD-10-CM | POA: Diagnosis not present

## 2016-08-07 DIAGNOSIS — Z85828 Personal history of other malignant neoplasm of skin: Secondary | ICD-10-CM | POA: Diagnosis not present

## 2016-08-07 DIAGNOSIS — B078 Other viral warts: Secondary | ICD-10-CM | POA: Diagnosis not present

## 2016-08-07 DIAGNOSIS — L814 Other melanin hyperpigmentation: Secondary | ICD-10-CM | POA: Diagnosis not present

## 2016-08-12 DIAGNOSIS — Z961 Presence of intraocular lens: Secondary | ICD-10-CM | POA: Diagnosis not present

## 2016-08-12 DIAGNOSIS — H26493 Other secondary cataract, bilateral: Secondary | ICD-10-CM | POA: Diagnosis not present

## 2016-10-01 DIAGNOSIS — B351 Tinea unguium: Secondary | ICD-10-CM | POA: Diagnosis not present

## 2016-10-01 DIAGNOSIS — I10 Essential (primary) hypertension: Secondary | ICD-10-CM | POA: Diagnosis not present

## 2016-10-01 DIAGNOSIS — F0391 Unspecified dementia with behavioral disturbance: Secondary | ICD-10-CM | POA: Diagnosis not present

## 2016-10-01 DIAGNOSIS — E559 Vitamin D deficiency, unspecified: Secondary | ICD-10-CM | POA: Diagnosis not present

## 2016-10-25 DIAGNOSIS — F0390 Unspecified dementia without behavioral disturbance: Secondary | ICD-10-CM | POA: Diagnosis not present

## 2016-10-25 DIAGNOSIS — E440 Moderate protein-calorie malnutrition: Secondary | ICD-10-CM | POA: Diagnosis not present

## 2016-10-25 DIAGNOSIS — F329 Major depressive disorder, single episode, unspecified: Secondary | ICD-10-CM | POA: Diagnosis not present

## 2016-10-25 DIAGNOSIS — I10 Essential (primary) hypertension: Secondary | ICD-10-CM | POA: Diagnosis not present

## 2016-10-25 DIAGNOSIS — Z853 Personal history of malignant neoplasm of breast: Secondary | ICD-10-CM | POA: Diagnosis not present

## 2016-10-25 DIAGNOSIS — D6489 Other specified anemias: Secondary | ICD-10-CM | POA: Diagnosis not present

## 2016-10-25 DIAGNOSIS — E559 Vitamin D deficiency, unspecified: Secondary | ICD-10-CM | POA: Diagnosis not present

## 2016-10-31 DIAGNOSIS — M6281 Muscle weakness (generalized): Secondary | ICD-10-CM | POA: Diagnosis not present

## 2016-10-31 DIAGNOSIS — R278 Other lack of coordination: Secondary | ICD-10-CM | POA: Diagnosis not present

## 2016-11-01 DIAGNOSIS — F339 Major depressive disorder, recurrent, unspecified: Secondary | ICD-10-CM | POA: Diagnosis not present

## 2016-11-01 DIAGNOSIS — R278 Other lack of coordination: Secondary | ICD-10-CM | POA: Diagnosis not present

## 2016-11-01 DIAGNOSIS — D509 Iron deficiency anemia, unspecified: Secondary | ICD-10-CM | POA: Diagnosis not present

## 2016-11-01 DIAGNOSIS — M6281 Muscle weakness (generalized): Secondary | ICD-10-CM | POA: Diagnosis not present

## 2016-11-01 DIAGNOSIS — I10 Essential (primary) hypertension: Secondary | ICD-10-CM | POA: Diagnosis not present

## 2016-11-01 DIAGNOSIS — F0391 Unspecified dementia with behavioral disturbance: Secondary | ICD-10-CM | POA: Diagnosis not present

## 2016-11-02 DIAGNOSIS — R0989 Other specified symptoms and signs involving the circulatory and respiratory systems: Secondary | ICD-10-CM | POA: Diagnosis not present

## 2016-11-02 DIAGNOSIS — R05 Cough: Secondary | ICD-10-CM | POA: Diagnosis not present

## 2016-11-04 DIAGNOSIS — F0390 Unspecified dementia without behavioral disturbance: Secondary | ICD-10-CM | POA: Diagnosis not present

## 2016-11-04 DIAGNOSIS — E440 Moderate protein-calorie malnutrition: Secondary | ICD-10-CM | POA: Diagnosis not present

## 2016-11-04 DIAGNOSIS — I10 Essential (primary) hypertension: Secondary | ICD-10-CM | POA: Diagnosis not present

## 2016-11-04 DIAGNOSIS — F329 Major depressive disorder, single episode, unspecified: Secondary | ICD-10-CM | POA: Diagnosis not present

## 2016-11-04 DIAGNOSIS — J208 Acute bronchitis due to other specified organisms: Secondary | ICD-10-CM | POA: Diagnosis not present

## 2016-11-04 DIAGNOSIS — E559 Vitamin D deficiency, unspecified: Secondary | ICD-10-CM | POA: Diagnosis not present

## 2016-11-04 DIAGNOSIS — Z853 Personal history of malignant neoplasm of breast: Secondary | ICD-10-CM | POA: Diagnosis not present

## 2016-11-04 DIAGNOSIS — D6489 Other specified anemias: Secondary | ICD-10-CM | POA: Diagnosis not present

## 2016-11-05 DIAGNOSIS — F0391 Unspecified dementia with behavioral disturbance: Secondary | ICD-10-CM | POA: Diagnosis not present

## 2016-11-05 DIAGNOSIS — E559 Vitamin D deficiency, unspecified: Secondary | ICD-10-CM | POA: Diagnosis not present

## 2016-11-05 DIAGNOSIS — F341 Dysthymic disorder: Secondary | ICD-10-CM | POA: Diagnosis not present

## 2016-11-05 DIAGNOSIS — I10 Essential (primary) hypertension: Secondary | ICD-10-CM | POA: Diagnosis not present

## 2016-11-06 DIAGNOSIS — I10 Essential (primary) hypertension: Secondary | ICD-10-CM | POA: Diagnosis not present

## 2016-11-06 DIAGNOSIS — E039 Hypothyroidism, unspecified: Secondary | ICD-10-CM | POA: Diagnosis not present

## 2016-11-06 DIAGNOSIS — Z79899 Other long term (current) drug therapy: Secondary | ICD-10-CM | POA: Diagnosis not present

## 2016-11-06 DIAGNOSIS — E785 Hyperlipidemia, unspecified: Secondary | ICD-10-CM | POA: Diagnosis not present

## 2016-11-06 DIAGNOSIS — D649 Anemia, unspecified: Secondary | ICD-10-CM | POA: Diagnosis not present

## 2016-11-06 DIAGNOSIS — E559 Vitamin D deficiency, unspecified: Secondary | ICD-10-CM | POA: Diagnosis not present

## 2016-12-13 DIAGNOSIS — E441 Mild protein-calorie malnutrition: Secondary | ICD-10-CM | POA: Diagnosis not present

## 2016-12-13 DIAGNOSIS — D6489 Other specified anemias: Secondary | ICD-10-CM | POA: Diagnosis not present

## 2016-12-13 DIAGNOSIS — I10 Essential (primary) hypertension: Secondary | ICD-10-CM | POA: Diagnosis not present

## 2016-12-13 DIAGNOSIS — F329 Major depressive disorder, single episode, unspecified: Secondary | ICD-10-CM | POA: Diagnosis not present

## 2016-12-13 DIAGNOSIS — F0280 Dementia in other diseases classified elsewhere without behavioral disturbance: Secondary | ICD-10-CM | POA: Diagnosis not present

## 2016-12-13 DIAGNOSIS — J208 Acute bronchitis due to other specified organisms: Secondary | ICD-10-CM | POA: Diagnosis not present

## 2016-12-13 DIAGNOSIS — Z853 Personal history of malignant neoplasm of breast: Secondary | ICD-10-CM | POA: Diagnosis not present

## 2016-12-13 DIAGNOSIS — E559 Vitamin D deficiency, unspecified: Secondary | ICD-10-CM | POA: Diagnosis not present

## 2017-01-06 DIAGNOSIS — F0391 Unspecified dementia with behavioral disturbance: Secondary | ICD-10-CM | POA: Diagnosis not present

## 2017-01-06 DIAGNOSIS — J069 Acute upper respiratory infection, unspecified: Secondary | ICD-10-CM | POA: Diagnosis not present

## 2017-01-06 DIAGNOSIS — I10 Essential (primary) hypertension: Secondary | ICD-10-CM | POA: Diagnosis not present

## 2017-01-06 DIAGNOSIS — D649 Anemia, unspecified: Secondary | ICD-10-CM | POA: Diagnosis not present

## 2017-01-20 DIAGNOSIS — I10 Essential (primary) hypertension: Secondary | ICD-10-CM | POA: Diagnosis not present

## 2017-01-20 DIAGNOSIS — R1110 Vomiting, unspecified: Secondary | ICD-10-CM | POA: Diagnosis not present

## 2017-01-20 DIAGNOSIS — F0391 Unspecified dementia with behavioral disturbance: Secondary | ICD-10-CM | POA: Diagnosis not present

## 2017-01-20 DIAGNOSIS — F341 Dysthymic disorder: Secondary | ICD-10-CM | POA: Diagnosis not present

## 2017-01-21 DIAGNOSIS — F0391 Unspecified dementia with behavioral disturbance: Secondary | ICD-10-CM | POA: Diagnosis not present

## 2017-01-21 DIAGNOSIS — R1110 Vomiting, unspecified: Secondary | ICD-10-CM | POA: Diagnosis not present

## 2017-01-21 DIAGNOSIS — I10 Essential (primary) hypertension: Secondary | ICD-10-CM | POA: Diagnosis not present

## 2017-01-21 DIAGNOSIS — F341 Dysthymic disorder: Secondary | ICD-10-CM | POA: Diagnosis not present

## 2017-02-05 DIAGNOSIS — I70203 Unspecified atherosclerosis of native arteries of extremities, bilateral legs: Secondary | ICD-10-CM | POA: Diagnosis not present

## 2017-02-05 DIAGNOSIS — L603 Nail dystrophy: Secondary | ICD-10-CM | POA: Diagnosis not present

## 2017-02-20 DIAGNOSIS — D649 Anemia, unspecified: Secondary | ICD-10-CM | POA: Diagnosis not present

## 2017-02-20 DIAGNOSIS — F22 Delusional disorders: Secondary | ICD-10-CM | POA: Diagnosis not present

## 2017-02-20 DIAGNOSIS — E46 Unspecified protein-calorie malnutrition: Secondary | ICD-10-CM | POA: Diagnosis not present

## 2017-02-20 DIAGNOSIS — F0391 Unspecified dementia with behavioral disturbance: Secondary | ICD-10-CM | POA: Diagnosis not present

## 2017-02-20 DIAGNOSIS — F322 Major depressive disorder, single episode, severe without psychotic features: Secondary | ICD-10-CM | POA: Diagnosis not present

## 2017-02-20 DIAGNOSIS — I10 Essential (primary) hypertension: Secondary | ICD-10-CM | POA: Diagnosis not present

## 2017-03-21 DIAGNOSIS — F322 Major depressive disorder, single episode, severe without psychotic features: Secondary | ICD-10-CM | POA: Diagnosis not present

## 2017-03-21 DIAGNOSIS — E46 Unspecified protein-calorie malnutrition: Secondary | ICD-10-CM | POA: Diagnosis not present

## 2017-03-21 DIAGNOSIS — I10 Essential (primary) hypertension: Secondary | ICD-10-CM | POA: Diagnosis not present

## 2017-03-21 DIAGNOSIS — D649 Anemia, unspecified: Secondary | ICD-10-CM | POA: Diagnosis not present

## 2017-03-21 DIAGNOSIS — F0391 Unspecified dementia with behavioral disturbance: Secondary | ICD-10-CM | POA: Diagnosis not present

## 2017-03-29 ENCOUNTER — Emergency Department (HOSPITAL_COMMUNITY)
Admission: EM | Admit: 2017-03-29 | Discharge: 2017-03-29 | Discharge status: Absent without leave | Payer: Medicare Other

## 2017-03-29 ENCOUNTER — Encounter (HOSPITAL_COMMUNITY): Payer: Self-pay

## 2017-03-29 ENCOUNTER — Ambulatory Visit (INDEPENDENT_AMBULATORY_CARE_PROVIDER_SITE_OTHER): Payer: Medicare Other | Admitting: Family Medicine

## 2017-03-29 ENCOUNTER — Emergency Department (HOSPITAL_COMMUNITY): Payer: Medicare Other

## 2017-03-29 ENCOUNTER — Inpatient Hospital Stay (HOSPITAL_COMMUNITY)
Admission: EM | Admit: 2017-03-29 | Discharge: 2017-04-03 | DRG: 202 | Disposition: A | Discharge status: Skilled Nursing Facility | Payer: Medicare Other | Attending: Family Medicine | Admitting: Family Medicine

## 2017-03-29 VITALS — BP 135/54 | HR 83 | Temp 98.2°F | Resp 20

## 2017-03-29 DIAGNOSIS — D649 Anemia, unspecified: Secondary | ICD-10-CM

## 2017-03-29 DIAGNOSIS — B9789 Other viral agents as the cause of diseases classified elsewhere: Secondary | ICD-10-CM | POA: Diagnosis present

## 2017-03-29 DIAGNOSIS — J40 Bronchitis, not specified as acute or chronic: Secondary | ICD-10-CM

## 2017-03-29 DIAGNOSIS — Z9012 Acquired absence of left breast and nipple: Secondary | ICD-10-CM

## 2017-03-29 DIAGNOSIS — I10 Essential (primary) hypertension: Secondary | ICD-10-CM | POA: Diagnosis present

## 2017-03-29 DIAGNOSIS — R0689 Other abnormalities of breathing: Secondary | ICD-10-CM | POA: Diagnosis not present

## 2017-03-29 DIAGNOSIS — Z66 Do not resuscitate: Secondary | ICD-10-CM | POA: Diagnosis not present

## 2017-03-29 DIAGNOSIS — F0390 Unspecified dementia without behavioral disturbance: Secondary | ICD-10-CM | POA: Diagnosis present

## 2017-03-29 DIAGNOSIS — D509 Iron deficiency anemia, unspecified: Secondary | ICD-10-CM

## 2017-03-29 DIAGNOSIS — R05 Cough: Secondary | ICD-10-CM | POA: Diagnosis not present

## 2017-03-29 DIAGNOSIS — Z853 Personal history of malignant neoplasm of breast: Secondary | ICD-10-CM

## 2017-03-29 DIAGNOSIS — L89621 Pressure ulcer of left heel, stage 1: Secondary | ICD-10-CM | POA: Diagnosis present

## 2017-03-29 DIAGNOSIS — Z888 Allergy status to other drugs, medicaments and biological substances status: Secondary | ICD-10-CM

## 2017-03-29 DIAGNOSIS — L8990 Pressure ulcer of unspecified site, unspecified stage: Secondary | ICD-10-CM | POA: Insufficient documentation

## 2017-03-29 DIAGNOSIS — Z79891 Long term (current) use of opiate analgesic: Secondary | ICD-10-CM

## 2017-03-29 DIAGNOSIS — F05 Delirium due to known physiological condition: Secondary | ICD-10-CM

## 2017-03-29 DIAGNOSIS — F329 Major depressive disorder, single episode, unspecified: Secondary | ICD-10-CM | POA: Diagnosis present

## 2017-03-29 DIAGNOSIS — J9601 Acute respiratory failure with hypoxia: Secondary | ICD-10-CM | POA: Diagnosis not present

## 2017-03-29 DIAGNOSIS — R062 Wheezing: Secondary | ICD-10-CM

## 2017-03-29 DIAGNOSIS — B9710 Unspecified enterovirus as the cause of diseases classified elsewhere: Secondary | ICD-10-CM | POA: Diagnosis present

## 2017-03-29 DIAGNOSIS — D696 Thrombocytopenia, unspecified: Secondary | ICD-10-CM | POA: Diagnosis present

## 2017-03-29 DIAGNOSIS — Z515 Encounter for palliative care: Secondary | ICD-10-CM | POA: Diagnosis not present

## 2017-03-29 DIAGNOSIS — R739 Hyperglycemia, unspecified: Secondary | ICD-10-CM

## 2017-03-29 DIAGNOSIS — Z881 Allergy status to other antibiotic agents status: Secondary | ICD-10-CM

## 2017-03-29 DIAGNOSIS — R059 Cough, unspecified: Secondary | ICD-10-CM

## 2017-03-29 DIAGNOSIS — R1310 Dysphagia, unspecified: Secondary | ICD-10-CM | POA: Diagnosis present

## 2017-03-29 DIAGNOSIS — R410 Disorientation, unspecified: Secondary | ICD-10-CM

## 2017-03-29 DIAGNOSIS — J206 Acute bronchitis due to rhinovirus: Secondary | ICD-10-CM | POA: Diagnosis not present

## 2017-03-29 DIAGNOSIS — M4854XA Collapsed vertebra, not elsewhere classified, thoracic region, initial encounter for fracture: Secondary | ICD-10-CM | POA: Diagnosis not present

## 2017-03-29 DIAGNOSIS — F0391 Unspecified dementia with behavioral disturbance: Secondary | ICD-10-CM | POA: Diagnosis not present

## 2017-03-29 DIAGNOSIS — R000 Tachycardia, unspecified: Secondary | ICD-10-CM | POA: Diagnosis present

## 2017-03-29 DIAGNOSIS — Z79899 Other long term (current) drug therapy: Secondary | ICD-10-CM

## 2017-03-29 DIAGNOSIS — R0600 Dyspnea, unspecified: Secondary | ICD-10-CM

## 2017-03-29 DIAGNOSIS — Z9049 Acquired absence of other specified parts of digestive tract: Secondary | ICD-10-CM

## 2017-03-29 DIAGNOSIS — L89611 Pressure ulcer of right heel, stage 1: Secondary | ICD-10-CM | POA: Diagnosis present

## 2017-03-29 DIAGNOSIS — Z91040 Latex allergy status: Secondary | ICD-10-CM

## 2017-03-29 DIAGNOSIS — L89311 Pressure ulcer of right buttock, stage 1: Secondary | ICD-10-CM | POA: Diagnosis present

## 2017-03-29 DIAGNOSIS — R0602 Shortness of breath: Secondary | ICD-10-CM | POA: Diagnosis not present

## 2017-03-29 DIAGNOSIS — L89321 Pressure ulcer of left buttock, stage 1: Secondary | ICD-10-CM | POA: Diagnosis present

## 2017-03-29 MED ORDER — LORAZEPAM 2 MG/ML IJ SOLN
1.0000 mg | Freq: Once | INTRAMUSCULAR | Status: AC
  Administered 2017-03-29: 1 mg via INTRAMUSCULAR
  Filled 2017-03-29: qty 1

## 2017-03-29 MED ORDER — ALBUTEROL SULFATE (2.5 MG/3ML) 0.083% IN NEBU
2.5000 mg | INHALATION_SOLUTION | Freq: Once | RESPIRATORY_TRACT | Status: AC
  Administered 2017-03-29: 2.5 mg via RESPIRATORY_TRACT
  Filled 2017-03-29: qty 3

## 2017-03-29 MED ORDER — LORAZEPAM 1 MG PO TABS
1.0000 mg | ORAL_TABLET | Freq: Once | ORAL | Status: DC
  Filled 2017-03-29: qty 1

## 2017-03-29 MED ORDER — AMOXICILLIN 500 MG PO CAPS
1000.0000 mg | ORAL_CAPSULE | Freq: Once | ORAL | Status: AC
  Administered 2017-03-29: 1000 mg via ORAL
  Filled 2017-03-29: qty 2

## 2017-03-29 MED ORDER — HALOPERIDOL LACTATE 5 MG/ML IJ SOLN
2.0000 mg | Freq: Once | INTRAMUSCULAR | Status: AC
  Administered 2017-03-29: 2 mg via INTRAMUSCULAR
  Filled 2017-03-29: qty 1

## 2017-03-29 MED ORDER — ALBUTEROL SULFATE (2.5 MG/3ML) 0.083% IN NEBU
5.0000 mg | INHALATION_SOLUTION | Freq: Once | RESPIRATORY_TRACT | Status: AC
  Administered 2017-03-29: 5 mg via RESPIRATORY_TRACT
  Filled 2017-03-29: qty 6

## 2017-03-29 MED ORDER — PREDNISONE 20 MG PO TABS
40.0000 mg | ORAL_TABLET | Freq: Once | ORAL | Status: AC
  Administered 2017-03-29: 40 mg via ORAL
  Filled 2017-03-29: qty 2

## 2017-03-29 MED ORDER — AZITHROMYCIN 250 MG PO TABS
500.0000 mg | ORAL_TABLET | Freq: Once | ORAL | Status: AC
  Administered 2017-03-29: 500 mg via ORAL
  Filled 2017-03-29: qty 2

## 2017-03-29 MED ORDER — ALBUTEROL SULFATE (2.5 MG/3ML) 0.083% IN NEBU
2.5000 mg | INHALATION_SOLUTION | Freq: Once | RESPIRATORY_TRACT | Status: AC
  Administered 2017-03-29: 2.5 mg via RESPIRATORY_TRACT

## 2017-03-29 NOTE — ED Notes (Signed)
EKG attempted on pt, was not successful due to some dementia. EKG not necessary per DR. Kohut.

## 2017-03-29 NOTE — ED Notes (Signed)
RT at bedside.

## 2017-03-29 NOTE — Patient Instructions (Addendum)
Go directly to [facility] emergency room. I did discuss with the triage nurse her symptoms and the treatment that was given here today.   IF you received an x-ray today, you will receive an invoice from [facility]. Please contact [facility] at [phone] with questions or concerns regarding your invoice.   IF you received labwork today, you will receive an invoice from [place]. Please contact [facility] at [phone] with questions or concerns regarding your invoice.   Our billing staff will not be able to assist you with questions regarding bills from these companies.  You will be contacted with the lab results as soon as they are available. The fastest way to get your results is to activate your My Chart account. Instructions are located on the last page of this paperwork. If you have not heard from [place] regarding the results in 2 weeks, please contact this office.

## 2017-03-29 NOTE — ED Notes (Signed)
RN ADVISED ME THE PATIENT WOULD NEED AN IV THE BLOOD SAMPLES WOULD BE COLLECTED AT THAT TIME.

## 2017-03-29 NOTE — ED Provider Notes (Signed)
[facility] DEPT Provider Note   CSN: [id number] Arrival date & time: 03/29/17  1750  By signing my name below, I, Bethany Fox, attest that this documentation has been prepared under the direction and in the presence of Bethany Manifold, MD. Electronically Signed: [name], ED Scribe. 03/29/17. 6:58 PM.  History   Chief Complaint Chief Complaint  Patient presents with  . Shortness of Breath   The history is provided by the patient, medical records and a relative. No language interpreter was used.    HPI Comments: LATERA MCLIN is a 83 y.o. female with a PMHx of dementia, HCAP, and breast cancer, who presents to the Emergency Department complaining of persistent, worsening shortness of breath with associated wheezing and cough beginning two days ago. Per family, pt resides in a SNF and the staff has noted that she has had increased work of breath and wheezing over the past two days. Her SNF has been administering Albuterol at their facility without relief of her respiratory symptoms. Family also reports that she has several prior HCAPs and her symptoms today are similar to this. Additionally, family reports that she seems acutely disoriented from her baseline, but they report that this is a typical association with the onset of her prior PNAs. Pt was taken to [place] today by family and at that time she was given a single trial of 2.5mg  Albuterol nebulizer with little relief of her symptoms. She was subsequently referred into the ED for further management. No h/o asthma, COPD, or CHF. Family denies vomiting, loss of appetite, fever, or any other associated symptoms.   Past Medical History:  Diagnosis Date  . Breast cancer ([place])   . Cancer ([place])    breast  . Dementia   . Depression   . Gastric ulcer   . H/O mastectomy    Left side    Patient Active Problem List   Diagnosis Date Noted  . HCAP (healthcare-associated pneumonia) 03/14/2015  . Sepsis due to  pneumonia ([place]) 03/14/2015  . Postoperative anemia due to acute blood loss 07/03/2014  . Acute renal insufficiency 07/03/2014  . Leukocytosis, unspecified 07/01/2014  . Fall 06/30/2014  . Hip fracture, right ([place]) 06/30/2014  . Right radial head fracture 06/30/2014  . HTN (hypertension) 06/30/2014  . Hip fracture ([place]) 06/30/2014  . Dementia    Past Surgical History:  Procedure Laterality Date  . CHOLECYSTECTOMY    . MASTECTOMY    . ORIF HIP FRACTURE Right 07/01/2014   Procedure: OPEN REDUCTION INTERNAL FIXATION HIP;  Surgeon: Mauri Pole, MD;  Location: [facility] ORS;  Service: Orthopedics;  Laterality: Right;   OB History    No data available     Home Medications    Prior to Admission medications   Medication Sig Start Date End Date Taking? Authorizing Provider  acetaminophen (TYLENOL) 500 MG tablet Take 500 mg by mouth every 6 (six) hours as needed for fever (or for pain).     Historical Provider, MD  amLODipine (NORVASC) 5 MG tablet Take 1 tablet (5 mg total) by mouth daily. Patient not taking: Reported on 03/29/2017 07/04/14   Bethany Filler, MD  amoxicillin-clavulanate (AUGMENTIN) 875-125 MG per tablet Take 1 tablet by mouth 2 (two) times daily. Patient not taking: Reported on 03/29/2017 03/19/15   Bethany Lis, MD  aspirin 325 MG EC tablet Take 325 mg by mouth 2 (two) times daily.    Historical Provider, MD  chlorhexidine (PERIDEX) 0.12 % solution Use as  directed 15 mLs in the mouth or throat 2 (two) times daily.    Historical Provider, MD  cholecalciferol (VITAMIN D) 1000 UNITS tablet Take 2,000 Units by mouth daily.    Historical Provider, MD  docusate sodium (COLACE) 100 MG capsule Take 100 mg by mouth 2 (two) times daily.    Historical Provider, MD  HYDROcodone-acetaminophen (NORCO/VICODIN) 5-325 MG per tablet Take 1-2 tablets by mouth every 6 (six) hours as needed for moderate pain. Patient not taking: Reported on 03/29/2017 03/19/15   Bethany Lis, MD  ipratropium-albuterol  (DUONEB) 0.5-2.5 (3) MG/3ML SOLN Take 3 mLs by nebulization every 4 (four) hours as needed. 03/19/15   Bethany Lis, MD  LORazepam (ATIVAN) 0.5 MG tablet Take 0.5 tablets (0.25 mg total) by mouth every 8 (eight) hours as needed for anxiety. Patient not taking: Reported on 03/29/2017 03/19/15   Bethany Lis, MD  mirtazapine (REMERON) 7.5 MG tablet Take 1 tablet (7.5 mg total) by mouth at bedtime. Patient not taking: Reported on 03/29/2017 03/19/15   Bethany Lis, MD  polyethylene glycol [facility] / Bethany Fox) packet Take 17 g by mouth daily. Patient not taking: Reported on 03/29/2017 03/19/15   Bethany Lis, MD  risperiDONE (RISPERDAL) 0.25 MG tablet Take 1 tablet (0.25 mg total) by mouth at bedtime. Patient not taking: Reported on 03/29/2017 03/19/15   Bethany Lis, MD  venlafaxine XR (EFFEXOR-XR) 75 MG 24 hr capsule Take 75 mg by mouth daily with breakfast.    Historical Provider, MD   Family History No family history on file.  Social History Social History  Substance Use Topics  . Smoking status: Never Smoker  . Smokeless tobacco: Not on file  . Alcohol use No   Allergies   Adhesive [tape]; Latex; and Ciprofloxacin  Review of Systems Review of Systems  Constitutional: Negative for appetite change and fever.  Respiratory: Positive for cough, shortness of breath and wheezing.   Gastrointestinal: Negative for vomiting.  Psychiatric/Behavioral: Positive for confusion.  All other systems reviewed and are negative.  Physical Exam Updated Vital Signs BP (!) 146/93 (BP Location: Left Arm)   Pulse 98   Temp 99.7 F (37.6 C) (Oral)   Resp 18   SpO2 95%   Physical Exam  Constitutional: She appears well-developed and well-nourished.  Pt is combative, exam limited d/t this.   HENT:  Head: Normocephalic.  Right Ear: External ear normal.  Left Ear: External ear normal.  Nose: Nose normal.  Mouth/Throat: Oropharynx is clear and moist.  Eyes: Conjunctivae are normal. Right eye exhibits  no discharge. Left eye exhibits no discharge.  Neck: Normal range of motion.  Cardiovascular: Normal rate, regular rhythm and normal heart sounds.   No murmur heard. Pulmonary/Chest: Tachypnea noted. No respiratory distress. She has wheezes.  Audible wheezing is noted.   Abdominal: Soft. She exhibits no distension. There is no tenderness. There is no rebound and no guarding.  Musculoskeletal: Normal range of motion. She exhibits no edema or tenderness.  Neurological: She is alert. No cranial nerve deficit. Coordination normal.  Skin: Skin is warm and dry. No rash noted. No erythema. No pallor.  Nursing note and vitals reviewed.  ED Treatments / Results  DIAGNOSTIC STUDIES: Oxygen Saturation is 95% on RA, adequate by my interpretation.   COORDINATION OF CARE: 6:53 PM-Discussed next steps with pt. Pt verbalized understanding and is agreeable with the plan.   Labs (all labs ordered are listed, but only abnormal results are displayed) Labs Reviewed -  No data to display  EKG  EKG Interpretation None      Radiology Dg Chest 2 View  Result Date: 03/29/2017 CLINICAL DATA:  Acute onset of shortness of breath and dyspnea. Initial encounter. EXAM: CHEST  2 VIEW COMPARISON:  Chest radiograph performed 03/17/2015 FINDINGS: The lungs are well-aerated and clear. There is no evidence of focal opacification, pleural effusion or pneumothorax. The heart is borderline normal in size. No acute osseous abnormalities are seen. Clips are seen at the left axilla. Clips are noted within the right upper quadrant, reflecting prior cholecystectomy. IMPRESSION: Previously noted bilateral airspace opacities have largely resolved. No acute cardiopulmonary process seen. Electronically Signed   By: Garald Balding M.D.   On: 03/29/2017 19:14    Procedures Procedures   Medications Ordered in ED Medications  albuterol (PROVENTIL) (2.5 MG/3ML) 0.083% nebulizer solution 5 mg (5 mg Nebulization Given 03/29/17 2010)   predniSONE (DELTASONE) tablet 40 mg (40 mg Oral Given 03/29/17 1955)  azithromycin (ZITHROMAX) tablet 500 mg (500 mg Oral Given 03/29/17 1954)  amoxicillin (AMOXIL) capsule 1,000 mg (1,000 mg Oral Given 03/29/17 1955)  albuterol (PROVENTIL) (2.5 MG/3ML) 0.083% nebulizer solution 2.5 mg (2.5 mg Nebulization Given 03/29/17 2222)  LORazepam (ATIVAN) injection 1 mg (1 mg Intramuscular Given 03/29/17 2344)  haloperidol lactate (HALDOL) injection 2 mg (2 mg Intramuscular Given 03/29/17 2342)   Initial Impression / Assessment and Plan / ED Course  I have reviewed the triage vital signs and the nursing notes.  Pertinent labs & imaging results that were available during my care of the patient were reviewed by me and considered in my medical decision making (see chart for details).  89yF with wheezing/cough.  Suspect this is viral bronchitis. Initial plan was to hopefully discharge. Although CXR w/o acute findings, with her reported fever and advanced age, I was going to place her on abx in additional to steroids/albuterol. Her WOB remains mildly increased though and may be more prudent to admit.    Final Clinical Impressions(s) / ED Diagnoses   Final diagnoses:  Bronchitis   New Prescriptions New Prescriptions   No medications on file   I personally preformed the services scribed in my presence. The recorded information has been reviewed is accurate. Bethany Manifold, MD.     Bethany Manifold, MD 03/31/17 [id number]

## 2017-03-29 NOTE — ED Notes (Signed)
RT at bedside for repeat breathing tx.

## 2017-03-29 NOTE — Progress Notes (Signed)
Subjective:  By signing my name below, I, Essence Howell, attest that this documentation has been prepared under the direction and in the presence of Wendie Agreste, MD Electronically Signed: Ladene Artist, ED Scribe 03/29/2017 at 4:57PM.   Patient ID: Susette Racer, female    DOB: 12/12/1917, 83 y.o.   MRN: [id number]  Chief Complaint  Patient presents with  . Cough    given neb at nh yesterday but still family feels she is sick   HPI MAURENE HOLLIN is a 83 y.o. female who presents to Primary Care at [facility] complaining of gradually worsening cough. New pt to me. PCP is Dr. Deforest Hoyles. She was admitted in 2016 for pneumonia. H/o multiple medical problems per problem list. Was treated for HCAP 02/2015. [facility].  Pt's family states that they went to visit pt in the nursing home 2 days ago and noticed that she was more disoriented. They report that confusion has worsened since yesterday. Family also report associated symptoms of wheezing, worsening cough and increased work of breathing when they visited her 2 days ago. She was given a nebulizer in the nursing home, however, her family states that did not provide much relief. Pt's family states the pt has a standing order of Augmentin for recurrent symptoms. No h/o asthma, COPD or CHF.   Patient Active Problem List   Diagnosis Date Noted  . HCAP (healthcare-associated pneumonia) 03/14/2015  . Sepsis due to pneumonia ([place]) 03/14/2015  . Postoperative anemia due to acute blood loss 07/03/2014  . Acute renal insufficiency 07/03/2014  . Leukocytosis, unspecified 07/01/2014  . Fall 06/30/2014  . Hip fracture, right ([place]) 06/30/2014  . Right radial head fracture 06/30/2014  . HTN (hypertension) 06/30/2014  . Hip fracture ([place]) 06/30/2014  . Dementia    Past Medical History:  Diagnosis Date  . Breast cancer   . Cancer    breast  . Dementia   . Depression   . Gastric ulcer   . H/O mastectomy    Left side    Past  Surgical History:  Procedure Laterality Date  . CHOLECYSTECTOMY    . MASTECTOMY    . ORIF HIP FRACTURE Right 07/01/2014   Procedure: OPEN REDUCTION INTERNAL FIXATION HIP;  Surgeon: Mauri Pole, MD;  Location: [facility];  Service: Orthopedics;  Laterality: Right;   Allergies  Allergen Reactions  . Adhesive [Tape]     Unknown   . Latex     rash  . Ciprofloxacin Rash   Prior to Admission medications   Medication Sig Start Date End Date Taking? Authorizing Provider  cholecalciferol (VITAMIN D) 1000 UNITS tablet Take 2,000 Units by mouth daily.   Yes Historical Provider, MD  ipratropium-albuterol (DUONEB) 0.5-2.5 (3) MG/3ML SOLN Take 3 mLs by nebulization every 4 (four) hours as needed. 03/19/15  Yes Robbie Lis, MD  acetaminophen (TYLENOL) 500 MG tablet Take 500 mg by mouth every 6 (six) hours as needed for fever (or for pain).     Historical Provider, MD  amLODipine (NORVASC) 5 MG tablet Take 1 tablet (5 mg total) by mouth daily. Patient not taking: Reported on 03/29/2017 07/04/14   Eugenie Filler, MD  amoxicillin-clavulanate (AUGMENTIN) 875-125 MG per tablet Take 1 tablet by mouth 2 (two) times daily. Patient not taking: Reported on 03/29/2017 03/19/15   Robbie Lis, MD  aspirin 325 MG EC tablet Take 325 mg by mouth 2 (two) times daily.    Historical Provider, MD  chlorhexidine ([place])  0.12 % solution Use as directed 15 mLs in the mouth or throat 2 (two) times daily.    Historical Provider, MD  docusate sodium (COLACE) 100 MG capsule Take 100 mg by mouth 2 (two) times daily.    Historical Provider, MD  HYDROcodone-acetaminophen (NORCO/VICODIN) 5-325 MG per tablet Take 1-2 tablets by mouth every 6 (six) hours as needed for moderate pain. Patient not taking: Reported on 03/29/2017 03/19/15   Robbie Lis, MD  LORazepam (ATIVAN) 0.5 MG tablet Take 0.5 tablets (0.25 mg total) by mouth every 8 (eight) hours as needed for anxiety. Patient not taking: Reported on 03/29/2017 03/19/15   Robbie Lis, MD  mirtazapine (REMERON) 7.5 MG tablet Take 1 tablet (7.5 mg total) by mouth at bedtime. Patient not taking: Reported on 03/29/2017 03/19/15   Robbie Lis, MD  polyethylene glycol [facility] / Floria Raveling) packet Take 17 g by mouth daily. Patient not taking: Reported on 03/29/2017 03/19/15   Robbie Lis, MD  risperiDONE (RISPERDAL) 0.25 MG tablet Take 1 tablet (0.25 mg total) by mouth at bedtime. Patient not taking: Reported on 03/29/2017 03/19/15   Robbie Lis, MD  venlafaxine XR (EFFEXOR-XR) 75 MG 24 hr capsule Take 75 mg by mouth daily with breakfast.    Historical Provider, MD   Social History   Social History  . Marital status: Widowed    Spouse name: N/A  . Number of children: N/A  . Years of education: N/A   Occupational History  . Not on file.   Social History Main Topics  . Smoking status: Never Smoker  . Smokeless tobacco: Not on file  . Alcohol use No  . Drug use: No  . Sexual activity: Not on file   Other Topics Concern  . Not on file   Social History Narrative  . No narrative on file   Review of Systems  Respiratory: Positive for cough, shortness of breath and wheezing.   Psychiatric/Behavioral: Positive for confusion.      Objective:   Physical Exam  Constitutional: She is oriented to person, place, and time. She appears well-developed and well-nourished. No distress.  HENT:  Head: Normocephalic and atraumatic.  Eyes: Conjunctivae and EOM are normal.  Neck: Neck supple. No tracheal deviation present.  Cardiovascular: Normal rate.   Pulmonary/Chest: Effort normal. No respiratory distress. She has wheezes.  Increase work of breathing. Prolonged expiratory phase. Inspiratory and expiratory wheeze. Diffuse crackles mixed with wheezing. Exam somewhat limited due to pt talking during exam.   Musculoskeletal: Normal range of motion.  Neurological: She is alert and oriented to person, place, and time.  Skin: Skin is warm and dry.  Psychiatric: She has a  normal mood and affect. Her behavior is normal.  Nursing note and vitals reviewed.  Vitals:   03/29/17 1539  BP: (!) 135/54  Pulse: 83  Resp: 20  Temp: 98.2 F (36.8 C)  TempSrc: Axillary  SpO2: 94%      Assessment & Plan:   ANAROSA KUBISIAK is a 83 y.o. female Cough - Plan: albuterol (PROVENTIL) (2.5 MG/3ML) 0.083% nebulizer solution 2.5 mg  Wheezing - Plan: albuterol (PROVENTIL) (2.5 MG/3ML) 0.083% nebulizer solution 2.5 mg  Breathing difficulty  Subacute confusional state  History of dementia, Prior healthcare associated pneumonia in 2016. History from family indicates increasing cough, wheezing since 2 nights ago. Attempt at neb treatment at nursing home with minimal improvement and now with increased confusion, increased work of breathing, persistent wheeze.   - inspiratory/expiratory  wheezing noted on evaluation, single trial of albuterol 2.5 neb given with minimal improvement. Vital signs noted as above.  Options discussed, but will have further evaluation through emergency room. Private vehicle transport. Charge nurse advised at [employer] .   Meds ordered this encounter  Medications  . albuterol (PROVENTIL) (2.5 MG/3ML) 0.083% nebulizer solution 2.5 mg   Patient Instructions   Go directly to [facility] emergency room. I did discuss with the triage nurse her symptoms and the treatment that was given here today.   IF you received an x-ray today, you will receive an invoice from [facility]. Please contact [facility] at [phone] with questions or concerns regarding your invoice.   IF you received labwork today, you will receive an invoice from [facility]. Please contact [facility] at [phone] with questions or concerns regarding your invoice.   Our billing staff will not be able to assist you with questions regarding bills from these companies.  You will be contacted with the lab results as soon as they are available. The fastest way to get  your results is to activate your My Chart account. Instructions are located on the last page of this paperwork. If you have not heard from [place] regarding the results in 2 weeks, please contact this office.

## 2017-03-29 NOTE — ED Triage Notes (Signed)
Her daughter brought her here with c/o shortness of breath for past 3-4 days. She states that, in the past when pt. Had these symptoms it had often been dx as pneumonia. She rec'd. And albuterol neb. At pcp just p.t.a. She remains short of breath and in no distress.

## 2017-03-30 ENCOUNTER — Observation Stay (HOSPITAL_COMMUNITY): Payer: Medicare Other

## 2017-03-30 ENCOUNTER — Encounter (HOSPITAL_COMMUNITY): Payer: Self-pay | Admitting: Internal Medicine

## 2017-03-30 DIAGNOSIS — M4854XA Collapsed vertebra, not elsewhere classified, thoracic region, initial encounter for fracture: Secondary | ICD-10-CM | POA: Diagnosis present

## 2017-03-30 DIAGNOSIS — L89321 Pressure ulcer of left buttock, stage 1: Secondary | ICD-10-CM | POA: Diagnosis present

## 2017-03-30 DIAGNOSIS — R0602 Shortness of breath: Secondary | ICD-10-CM | POA: Diagnosis not present

## 2017-03-30 DIAGNOSIS — J40 Bronchitis, not specified as acute or chronic: Secondary | ICD-10-CM | POA: Diagnosis not present

## 2017-03-30 DIAGNOSIS — Z888 Allergy status to other drugs, medicaments and biological substances status: Secondary | ICD-10-CM | POA: Diagnosis not present

## 2017-03-30 DIAGNOSIS — R739 Hyperglycemia, unspecified: Secondary | ICD-10-CM | POA: Diagnosis not present

## 2017-03-30 DIAGNOSIS — D509 Iron deficiency anemia, unspecified: Secondary | ICD-10-CM | POA: Diagnosis not present

## 2017-03-30 DIAGNOSIS — L89311 Pressure ulcer of right buttock, stage 1: Secondary | ICD-10-CM | POA: Diagnosis present

## 2017-03-30 DIAGNOSIS — B9789 Other viral agents as the cause of diseases classified elsewhere: Secondary | ICD-10-CM | POA: Diagnosis present

## 2017-03-30 DIAGNOSIS — R6520 Severe sepsis without septic shock: Secondary | ICD-10-CM | POA: Diagnosis not present

## 2017-03-30 DIAGNOSIS — L89611 Pressure ulcer of right heel, stage 1: Secondary | ICD-10-CM | POA: Diagnosis present

## 2017-03-30 DIAGNOSIS — R0600 Dyspnea, unspecified: Secondary | ICD-10-CM

## 2017-03-30 DIAGNOSIS — I10 Essential (primary) hypertension: Secondary | ICD-10-CM | POA: Diagnosis not present

## 2017-03-30 DIAGNOSIS — L89621 Pressure ulcer of left heel, stage 1: Secondary | ICD-10-CM | POA: Diagnosis present

## 2017-03-30 DIAGNOSIS — J9601 Acute respiratory failure with hypoxia: Secondary | ICD-10-CM | POA: Diagnosis not present

## 2017-03-30 DIAGNOSIS — J189 Pneumonia, unspecified organism: Secondary | ICD-10-CM | POA: Diagnosis not present

## 2017-03-30 DIAGNOSIS — R062 Wheezing: Secondary | ICD-10-CM

## 2017-03-30 DIAGNOSIS — R1310 Dysphagia, unspecified: Secondary | ICD-10-CM | POA: Diagnosis present

## 2017-03-30 DIAGNOSIS — F329 Major depressive disorder, single episode, unspecified: Secondary | ICD-10-CM | POA: Diagnosis present

## 2017-03-30 DIAGNOSIS — F0391 Unspecified dementia with behavioral disturbance: Secondary | ICD-10-CM | POA: Diagnosis present

## 2017-03-30 DIAGNOSIS — F0390 Unspecified dementia without behavioral disturbance: Secondary | ICD-10-CM | POA: Diagnosis not present

## 2017-03-30 DIAGNOSIS — D649 Anemia, unspecified: Secondary | ICD-10-CM

## 2017-03-30 DIAGNOSIS — Z515 Encounter for palliative care: Secondary | ICD-10-CM | POA: Diagnosis present

## 2017-03-30 DIAGNOSIS — Z853 Personal history of malignant neoplasm of breast: Secondary | ICD-10-CM | POA: Diagnosis not present

## 2017-03-30 DIAGNOSIS — Z9012 Acquired absence of left breast and nipple: Secondary | ICD-10-CM | POA: Diagnosis not present

## 2017-03-30 DIAGNOSIS — J206 Acute bronchitis due to rhinovirus: Secondary | ICD-10-CM | POA: Diagnosis not present

## 2017-03-30 DIAGNOSIS — B9710 Unspecified enterovirus as the cause of diseases classified elsewhere: Secondary | ICD-10-CM | POA: Diagnosis present

## 2017-03-30 DIAGNOSIS — Z7189 Other specified counseling: Secondary | ICD-10-CM | POA: Diagnosis not present

## 2017-03-30 DIAGNOSIS — Z66 Do not resuscitate: Secondary | ICD-10-CM | POA: Diagnosis present

## 2017-03-30 DIAGNOSIS — D696 Thrombocytopenia, unspecified: Secondary | ICD-10-CM | POA: Diagnosis present

## 2017-03-30 DIAGNOSIS — Z9049 Acquired absence of other specified parts of digestive tract: Secondary | ICD-10-CM | POA: Diagnosis not present

## 2017-03-30 DIAGNOSIS — Z881 Allergy status to other antibiotic agents status: Secondary | ICD-10-CM | POA: Diagnosis not present

## 2017-03-30 DIAGNOSIS — R000 Tachycardia, unspecified: Secondary | ICD-10-CM | POA: Diagnosis present

## 2017-03-30 LAB — RESPIRATORY PANEL BY PCR
Adenovirus: NOT DETECTED
BORDETELLA PERTUSSIS-RVPCR: NOT DETECTED
Chlamydophila pneumoniae: NOT DETECTED
Coronavirus 229E: NOT DETECTED
Coronavirus HKU1: NOT DETECTED
Coronavirus NL63: NOT DETECTED
Coronavirus OC43: NOT DETECTED
INFLUENZA B-RVPPCR: NOT DETECTED
Influenza A: NOT DETECTED
METAPNEUMOVIRUS-RVPPCR: NOT DETECTED
Mycoplasma pneumoniae: NOT DETECTED
PARAINFLUENZA VIRUS 2-RVPPCR: NOT DETECTED
PARAINFLUENZA VIRUS 3-RVPPCR: NOT DETECTED
Parainfluenza Virus 1: NOT DETECTED
Parainfluenza Virus 4: NOT DETECTED
RESPIRATORY SYNCYTIAL VIRUS-RVPPCR: NOT DETECTED
Rhinovirus / Enterovirus: DETECTED — AB

## 2017-03-30 LAB — TROPONIN I
Troponin I: <0.03 ng/mL (ref <0.03)
Troponin I: <0.03 ng/mL (ref <0.03)
Troponin I: <0.03 ng/mL (ref <0.03)

## 2017-03-30 LAB — CBC WITH DIFFERENTIAL/PLATELET
Basophils Absolute: 0 10*3/uL (ref 0.0–0.1)
Basophils Relative: 0 %
Eosinophils Absolute: 0 10*3/uL (ref 0.0–0.7)
Eosinophils Relative: 0 %
HCT: 31 % — ABNORMAL LOW (ref 36.0–46.0)
Hemoglobin: 10.2 g/dL — ABNORMAL LOW (ref 12.0–15.0)
Lymphocytes Relative: 5 %
Lymphs Abs: 0.4 10*3/uL — ABNORMAL LOW (ref 0.7–4.0)
MCH: 29.1 pg (ref 26.0–34.0)
MCHC: 32.9 g/dL (ref 30.0–36.0)
MCV: 88.3 fL (ref 78.0–100.0)
Monocytes Absolute: 0.1 10*3/uL (ref 0.1–1.0)
Monocytes Relative: 1 %
Neutro Abs: 8.3 10*3/uL — ABNORMAL HIGH (ref 1.7–7.7)
Neutrophils Relative %: 94 %
Platelets: 143 10*3/uL — ABNORMAL LOW (ref 150–400)
RBC: 3.51 MIL/uL — ABNORMAL LOW (ref 3.87–5.11)
RDW: 16.4 % — ABNORMAL HIGH (ref 11.5–15.5)
WBC: 8.9 10*3/uL (ref 4.0–10.5)

## 2017-03-30 LAB — BASIC METABOLIC PANEL
Anion gap: 8 (ref 5–15)
BUN: 20 mg/dL (ref 6–20)
CO2: 23 mmol/L (ref 22–32)
Calcium: 8.6 mg/dL — ABNORMAL LOW (ref 8.9–10.3)
Chloride: 108 mmol/L (ref 101–111)
Creatinine, Ser: 0.76 mg/dL (ref 0.44–1.00)
GFR calc Af Amer: >60 mL/min (ref >60)
GFR calc non Af Amer: >60 mL/min (ref >60)
Glucose, Bld: 178 mg/dL — ABNORMAL HIGH (ref 65–99)
Potassium: 3.6 mmol/L (ref 3.5–5.1)
Sodium: 139 mmol/L (ref 135–145)

## 2017-03-30 LAB — TSH: TSH: 0.66 u[IU]/mL (ref 0.350–4.500)

## 2017-03-30 LAB — SEDIMENTATION RATE: Sed Rate: 95 mm/hr — ABNORMAL HIGH (ref 0–22)

## 2017-03-30 LAB — CBC
HCT: 31 % — ABNORMAL LOW (ref 36.0–46.0)
HEMOGLOBIN: 10.3 g/dL — AB (ref 12.0–15.0)
MCH: 29.3 pg (ref 26.0–34.0)
MCHC: 33.2 g/dL (ref 30.0–36.0)
MCV: 88.3 fL (ref 78.0–100.0)
Platelets: 145 10*3/uL — ABNORMAL LOW (ref 150–400)
RBC: 3.51 MIL/uL — ABNORMAL LOW (ref 3.87–5.11)
RDW: 16.3 % — AB (ref 11.5–15.5)
WBC: 7.2 10*3/uL (ref 4.0–10.5)

## 2017-03-30 LAB — COMPREHENSIVE METABOLIC PANEL
ALBUMIN: 2.9 g/dL — AB (ref 3.5–5.0)
ALK PHOS: 62 U/L (ref 38–126)
ALT: 12 U/L — AB (ref 14–54)
ANION GAP: 8 (ref 5–15)
AST: 20 U/L (ref 15–41)
BUN: 20 mg/dL (ref 6–20)
CALCIUM: 8.5 mg/dL — AB (ref 8.9–10.3)
CO2: 24 mmol/L (ref 22–32)
Chloride: 108 mmol/L (ref 101–111)
Creatinine, Ser: 0.76 mg/dL (ref 0.44–1.00)
GFR calc Af Amer: >60 mL/min (ref >60)
GFR calc non Af Amer: >60 mL/min (ref >60)
GLUCOSE: 173 mg/dL — AB (ref 65–99)
Potassium: 3.5 mmol/L (ref 3.5–5.1)
SODIUM: 140 mmol/L (ref 135–145)
Total Bilirubin: 0.9 mg/dL (ref 0.3–1.2)
Total Protein: 6.9 g/dL (ref 6.5–8.1)

## 2017-03-30 LAB — IRON AND TIBC
Iron: 10 ug/dL — ABNORMAL LOW (ref 28–170)
SATURATION RATIOS: 4 % — AB (ref 10.4–31.8)
TIBC: 263 ug/dL (ref 250–450)
UIBC: 253 ug/dL

## 2017-03-30 LAB — VITAMIN B12: Vitamin B-12: 338 pg/mL (ref 180–914)

## 2017-03-30 LAB — FERRITIN: FERRITIN: 46 ng/mL (ref 11–307)

## 2017-03-30 LAB — INFLUENZA PANEL BY PCR (TYPE A & B)
INFLAPCR: NEGATIVE
Influenza B By PCR: NEGATIVE

## 2017-03-30 LAB — D-DIMER, QUANTITATIVE: D-Dimer, Quant: 0.81 ug/mL-FEU — ABNORMAL HIGH (ref 0.00–0.50)

## 2017-03-30 LAB — MRSA PCR SCREENING: MRSA by PCR: NEGATIVE

## 2017-03-30 MED ORDER — ONDANSETRON HCL 4 MG PO TABS: 4.0000 mg | ORAL_TABLET | Freq: Every day | ORAL | Status: DC | PRN

## 2017-03-30 MED ORDER — PREDNISONE 20 MG PO TABS
40.0000 mg | ORAL_TABLET | Freq: Every day | ORAL | Status: DC
  Administered 2017-03-30: 40 mg via ORAL
  Filled 2017-03-30: qty 2

## 2017-03-30 MED ORDER — SODIUM CHLORIDE 0.9% FLUSH
3.0000 mL | Freq: Two times a day (BID) | INTRAVENOUS | Status: DC
  Administered 2017-03-30 – 2017-04-01 (×6): 3 mL via INTRAVENOUS

## 2017-03-30 MED ORDER — DEXTROSE 5 % IV SOLN
500.0000 mg | INTRAVENOUS | Status: DC
  Administered 2017-03-30 – 2017-04-01 (×3): 500 mg via INTRAVENOUS
  Filled 2017-03-30 (×3): qty 500

## 2017-03-30 MED ORDER — IOPAMIDOL (ISOVUE-370) INJECTION 76%
100.0000 mL | Freq: Once | INTRAVENOUS | Status: AC | PRN
  Administered 2017-03-30: 100 mL via INTRAVENOUS

## 2017-03-30 MED ORDER — IPRATROPIUM-ALBUTEROL 0.5-2.5 (3) MG/3ML IN SOLN
3.0000 mL | Freq: Four times a day (QID) | RESPIRATORY_TRACT | Status: DC
  Administered 2017-03-30 – 2017-04-02 (×14): 3 mL via RESPIRATORY_TRACT
  Filled 2017-03-30 (×13): qty 3

## 2017-03-30 MED ORDER — SODIUM CHLORIDE 0.9 % IV SOLN
INTRAVENOUS | Status: AC
  Administered 2017-03-30: 04:00:00 via INTRAVENOUS
  Administered 2017-03-31: 50 mL/h via INTRAVENOUS

## 2017-03-30 MED ORDER — METHYLPREDNISOLONE SODIUM SUCC 125 MG IJ SOLR
60.0000 mg | Freq: Two times a day (BID) | INTRAMUSCULAR | Status: DC
  Administered 2017-03-30 (×2): 60 mg via INTRAVENOUS
  Filled 2017-03-30 (×2): qty 2

## 2017-03-30 MED ORDER — IPRATROPIUM-ALBUTEROL 0.5-2.5 (3) MG/3ML IN SOLN
3.0000 mL | RESPIRATORY_TRACT | Status: DC | PRN
  Filled 2017-03-30: qty 3

## 2017-03-30 MED ORDER — DEXTROSE 5 % IV SOLN
1.0000 g | Freq: Every day | INTRAVENOUS | Status: DC
  Administered 2017-03-30 – 2017-04-01 (×4): 1 g via INTRAVENOUS
  Filled 2017-03-30 (×4): qty 10

## 2017-03-30 MED ORDER — ENOXAPARIN SODIUM 30 MG/0.3ML ~~LOC~~ SOLN
30.0000 mg | SUBCUTANEOUS | Status: DC
  Administered 2017-03-30: 30 mg via SUBCUTANEOUS
  Filled 2017-03-30: qty 0.3

## 2017-03-30 MED ORDER — IOPAMIDOL (ISOVUE-370) INJECTION 76%
INTRAVENOUS | Status: AC
  Filled 2017-03-30: qty 100

## 2017-03-30 MED ORDER — LORAZEPAM 2 MG/ML IJ SOLN
0.5000 mg | Freq: Once | INTRAMUSCULAR | Status: AC
  Administered 2017-03-30: 0.5 mg via INTRAVENOUS
  Filled 2017-03-30: qty 1

## 2017-03-30 MED ORDER — VITAMIN D (ERGOCALCIFEROL) 1.25 MG (50000 UNIT) PO CAPS: 50000.0000 [IU] | ORAL_CAPSULE | ORAL | Status: DC

## 2017-03-30 NOTE — H&P (Signed)
TRH H&P   Patient Demographics:    Bethany Fox, is a 83 y.o. female  MRN: [id number]   DOB - 1917-07-29  Admit Date - 03/29/2017  Outpatient Primary MD for the patient is Odette Fraction, MD  Referring MD/NP/PA: Dr. Wilson Singer  Outpatient Specialists:   Patient coming from: [facility]  Chief Complaint  Patient presents with  . Shortness of Breath      HPI:    Bethany Fox  is a 83 y.o. female, w dementia , hx of pneumonia apparently was found by family on Friday to be wheezing and congested.  Pt seemed worse on Saturday and therefore went to urgent care at [place] and sent to ED for evaluation.    In ED, CXR negative for any focal infiltrate,  Pt wbc 8.9,  Pt noted to have mild anemia w hgb 10.2, and mild hyperglycemia with glucose 178.  Pt will be admitted for dyspnea, wheezing and tachycardia.      Review of systems:    In addition to the HPI above,  No Fever-chills, No Headache, No changes with Vision or hearing, No problems swallowing food or Liquids, No Chest pain, + slight cough,  No Abdominal pain, No Nausea or Vommitting, Bowel movements are regular, No Blood in stool or Urine, No dysuria, No new skin rashes or bruises, No new joints pains-aches,  No new weakness, tingling, numbness in any extremity, No recent weight gain or loss, No polyuria, polydypsia or polyphagia, No significant Mental Stressors.  A full 10 point Review of Systems was done, except as stated above, all other Review of Systems were negative.   With Past History of the following :    Past Medical History:  Diagnosis Date  . Breast cancer ([place])   . Cancer ([place])    breast  . Dementia   . Depression   . Gastric ulcer   . H/O mastectomy    Left side       Past Surgical History:  Procedure Laterality Date  . APPENDECTOMY    . CHOLECYSTECTOMY    . MASTECTOMY    . ORIF HIP  FRACTURE Right 07/01/2014   Procedure: OPEN REDUCTION INTERNAL FIXATION HIP;  Surgeon: Mauri Pole, MD;  Location: [facility];  Service: Orthopedics;  Laterality: Right;      Social History:     Social History  Substance Use Topics  . Smoking status: Never Smoker  . Smokeless tobacco: Never Used  . Alcohol use No     Lives - at [employer]  Mobility - doesn't walk, uses wheel chair     Family History :     Family History  Problem Relation Age of Onset  . Family history unknown: Yes   Unknown due to dementia   Home Medications:   Prior to Admission medications   Medication Sig Start Date End Date Taking? Authorizing Provider  acetaminophen (  TYLENOL) 500 MG tablet Take 500 mg by mouth every 6 (six) hours as needed for fever (or for pain).    Yes Historical Provider, MD  ipratropium-albuterol (DUONEB) 0.5-2.5 (3) MG/3ML SOLN Take 3 mLs by nebulization every 4 (four) hours as needed. 03/19/15  Yes Robbie Lis, MD  magnesium hydroxide (MILK OF MAGNESIA) 400 MG/5ML suspension Take 30 mLs by mouth daily as needed for mild constipation.   Yes Historical Provider, MD  ondansetron (ZOFRAN) 4 MG tablet Take 4 mg by mouth daily as needed for nausea. 02/24/17  Yes Historical Provider, MD  Vitamin D, Ergocalciferol, (DRISDOL) 50000 units CAPS capsule Take 50,000 Units by mouth every 7 (seven) days.   Yes Historical Provider, MD  amLODipine (NORVASC) 5 MG tablet Take 1 tablet (5 mg total) by mouth daily. Patient not taking: Reported on 03/29/2017 07/04/14   Eugenie Filler, MD  amoxicillin-clavulanate (AUGMENTIN) 875-125 MG per tablet Take 1 tablet by mouth 2 (two) times daily. Patient not taking: Reported on 03/29/2017 03/19/15   Robbie Lis, MD  HYDROcodone-acetaminophen (NORCO/VICODIN) 5-325 MG per tablet Take 1-2 tablets by mouth every 6 (six) hours as needed for moderate pain. Patient not taking: Reported on 03/29/2017 03/19/15   Robbie Lis, MD  LORazepam (ATIVAN) 0.5 MG tablet Take  0.5 tablets (0.25 mg total) by mouth every 8 (eight) hours as needed for anxiety. Patient not taking: Reported on 03/29/2017 03/19/15   Robbie Lis, MD  mirtazapine (REMERON) 7.5 MG tablet Take 1 tablet (7.5 mg total) by mouth at bedtime. Patient not taking: Reported on 03/29/2017 03/19/15   Robbie Lis, MD  polyethylene glycol [facility] Floria Raveling) packet Take 17 g by mouth daily. Patient not taking: Reported on 03/29/2017 03/19/15   Robbie Lis, MD  risperiDONE (RISPERDAL) 0.25 MG tablet Take 1 tablet (0.25 mg total) by mouth at bedtime. Patient not taking: Reported on 03/29/2017 03/19/15   Robbie Lis, MD     Allergies:     Allergies  Allergen Reactions  . Adhesive [Tape]     Unknown   . Latex     rash  . Ciprofloxacin Rash     Physical Exam:   Vitals  Blood pressure 125/81, pulse (!) 116, temperature 99.7 F (37.6 C), temperature source Oral, resp. rate (!) 21, SpO2 93 %.   1. General  lying in bed in NAD,   2. Normal affect and insight, Not Suicidal or Homicidal, Awake Alert, Oriented X 3.  3. No F.N deficits, ALL C.Nerves Intact, Strength 5/5 all 4 extremities, Sensation intact all 4 extremities, Plantars down going.  4. Ears and Eyes appear Normal, Conjunctivae clear, PERRLA. Moist Oral Mucosa.  5. Supple Neck, No JVD, No cervical lymphadenopathy appriciated, No Carotid Bruits.  6. Symmetrical Chest wall movement, Good air movement bilaterally, slight crackles at bilateral base, slight exp wheezing  7. RRR, No Gallops, Rubs or Murmurs, No Parasternal Heave.  8. Positive Bowel Sounds, Abdomen Soft, No tenderness, No organomegaly appriciated,No rebound -guarding or rigidity.  9.  No Cyanosis, Normal Skin Turgor, No Skin Rash or Bruise.  10. Good muscle tone,  joints appear normal , no effusions, Normal ROM.  11. No Palpable Lymph Nodes in Neck or Axillae     Data Review:    CBC  Recent Labs Lab 03/30/17 0026  WBC 8.9  HGB 10.2*  HCT 31.0*  PLT 143*    MCV 88.3  MCH 29.1  MCHC 32.9  RDW 16.4*  LYMPHSABS  0.4*  MONOABS 0.1  EOSABS 0.0  BASOSABS 0.0   ------------------------------------------------------------------------------------------------------------------  Chemistries   Recent Labs Lab 03/30/17 0026  NA 139  K 3.6  CL 108  CO2 23  GLUCOSE 178*  BUN 20  CREATININE 0.76  CALCIUM 8.6*   ------------------------------------------------------------------------------------------------------------------ CrCl cannot be calculated (Unknown ideal weight.). ------------------------------------------------------------------------------------------------------------------ No results for input(s): TSH, T4TOTAL, T3FREE, THYROIDAB in the last 72 hours.  Invalid input(s): FREET3  Coagulation profile No results for input(s): INR, PROTIME in the last 168 hours. ------------------------------------------------------------------------------------------------------------------- No results for input(s): DDIMER in the last 72 hours. -------------------------------------------------------------------------------------------------------------------  Cardiac Enzymes No results for input(s): CKMB, TROPONINI, MYOGLOBIN in the last 168 hours.  Invalid input(s): CK ------------------------------------------------------------------------------------------------------------------ No results found for: BNP   ---------------------------------------------------------------------------------------------------------------  Urinalysis    Component Value Date/Time   COLORURINE YELLOW 07/01/2014 [place] 07/01/2014 1025   LABSPEC 1.016 07/01/2014 1025   PHURINE 6.5 07/01/2014 1025   GLUCOSEU NEGATIVE 07/01/2014 1025   HGBUR NEGATIVE 07/01/2014 1025   BILIRUBINUR NEGATIVE 07/01/2014 1025   BILIRUBINUR neg 01/06/2014 [place] 07/01/2014 1025   PROTEINUR NEGATIVE 07/01/2014 1025   UROBILINOGEN 1.0  07/01/2014 1025   NITRITE NEGATIVE 07/01/2014 1025   LEUKOCYTESUR NEGATIVE 07/01/2014 1025    ----------------------------------------------------------------------------------------------------------------   Imaging Results:    Dg Chest 2 View  Result Date: 03/29/2017 CLINICAL DATA:  Acute onset of shortness of breath and dyspnea. Initial encounter. EXAM: CHEST  2 VIEW COMPARISON:  Chest radiograph performed 03/17/2015 FINDINGS: The lungs are well-aerated and clear. There is no evidence of focal opacification, pleural effusion or pneumothorax. The heart is borderline normal in size. No acute osseous abnormalities are seen. Clips are seen at the left axilla. Clips are noted within the right upper quadrant, reflecting prior cholecystectomy. IMPRESSION: Previously noted bilateral airspace opacities have largely resolved. No acute cardiopulmonary process seen. Electronically Signed   By: Garald Balding M.D.   On: 03/29/2017 19:14      Assessment & Plan:    Active Problems:   Dementia   HTN (hypertension)   Wheezing   Anemia   Hyperglycemia    1. CoughWheezing,  Start pt on rocephin 1gm iv qday zithromax 523m iv qday Cont prednisone 460mpo qday Respiratory panel  2. Anemia Check ferritin, iron, tibc, b12, folate, esr Repeat cbc in am  3. Hyperglycemia hga1c  4.  Tachycardia Check trop I q6h x 3, TSH Check d dimer, if positive then CTA chest  DVT Prophylaxis Lovenox - SCDs   AM Labs Ordered, also please review Full Orders  Family Communication: Admission, patients condition and plan of care including tests being ordered have been discussed with the patient  who indicate understanding and agree with the plan and Code Status.  Code Status  DNR  Likely DC to  [facility]  Condition GUARDED    Consults called:   Admission status: observation  Time spent in minutes : 45 minutes   JaJani Gravel.D on 03/30/2017 at 2:27 AM  Between 7am to 7pm - Pager - [phone] After 7pm go to [email or web address] - password [email or web address] - Office  [phone]

## 2017-03-30 NOTE — ED Notes (Signed)
Dr. Maudie Mercury completed family conversation and orders. Pt will be transported to assigned bed. Family aware of the pt's care plan.

## 2017-03-30 NOTE — Progress Notes (Signed)
Patient was admitted early this AM after midnight and H and P has been reviewed and I am in current agreement with the Assessment and Plan done by Dr. Maudie Mercury. Patient is a 83 year old Caucasian female from SNF with a PMH of Dementia and recurrent PNA's who was found by family to have wheezing and congestion. She worsened and was taken to Urgent Care who recommended that she be evaluated in the ED and was admitted for dyspnea, wheezing, and tachycardia. At the time of my examination the patient was somnolent after receiving Ativan in the ED for her behavioral disturbance from dementia. She was placed on DuoNebs, Antibiotics, and po Steroids. Po steroids have been since changed to IV 60 mg q12h and a Respiratory Virus Panel as well as a Influenza via PCR obtained and pending. CT PE was done as patient's D-Dimer was slightly elevated and showed No pumlonary emboli or acute abnormality and moderate to marked bronchitic changes as well as an old 25% T12 Compression Fracture.  Will also obtain a swallow evaluation given patients frail state and concern for aspiration. Will repeat bloodwork and imaging and closely monitor patient's clinical response to interventions.

## 2017-03-30 NOTE — Progress Notes (Signed)
DD noted to be .81 on admission to room.  Dr. Maudie Mercury on call and notified.

## 2017-03-30 NOTE — ED Notes (Signed)
Hospitalist at bedside pt will be transported to assigned room once his assessment is complete.

## 2017-03-31 ENCOUNTER — Inpatient Hospital Stay (HOSPITAL_COMMUNITY): Payer: Medicare Other

## 2017-03-31 DIAGNOSIS — L8990 Pressure ulcer of unspecified site, unspecified stage: Secondary | ICD-10-CM | POA: Insufficient documentation

## 2017-03-31 DIAGNOSIS — J206 Acute bronchitis due to rhinovirus: Principal | ICD-10-CM

## 2017-03-31 LAB — CBC WITH DIFFERENTIAL/PLATELET
BASOS PCT: 0 %
Basophils Absolute: 0 10*3/uL (ref 0.0–0.1)
EOS ABS: 0 10*3/uL (ref 0.0–0.7)
Eosinophils Relative: 0 %
HEMATOCRIT: 30.9 % — AB (ref 36.0–46.0)
Hemoglobin: 10 g/dL — ABNORMAL LOW (ref 12.0–15.0)
Lymphocytes Relative: 7 %
Lymphs Abs: 0.7 10*3/uL (ref 0.7–4.0)
MCH: 27.9 pg (ref 26.0–34.0)
MCHC: 32.4 g/dL (ref 30.0–36.0)
MCV: 86.1 fL (ref 78.0–100.0)
Monocytes Absolute: 0.1 10*3/uL (ref 0.1–1.0)
Monocytes Relative: 1 %
NEUTROS ABS: 8.7 10*3/uL — AB (ref 1.7–7.7)
NEUTROS PCT: 92 %
Platelets: 136 10*3/uL — ABNORMAL LOW (ref 150–400)
RBC: 3.59 MIL/uL — ABNORMAL LOW (ref 3.87–5.11)
RDW: 16 % — ABNORMAL HIGH (ref 11.5–15.5)
WBC: 9.5 10*3/uL (ref 4.0–10.5)

## 2017-03-31 LAB — COMPREHENSIVE METABOLIC PANEL
ALBUMIN: 3.1 g/dL — AB (ref 3.5–5.0)
ALK PHOS: 59 U/L (ref 38–126)
ALT: 12 U/L — ABNORMAL LOW (ref 14–54)
AST: 21 U/L (ref 15–41)
Anion gap: 8 (ref 5–15)
BILIRUBIN TOTAL: 0.5 mg/dL (ref 0.3–1.2)
BUN: 20 mg/dL (ref 6–20)
CALCIUM: 8.5 mg/dL — AB (ref 8.9–10.3)
CO2: 23 mmol/L (ref 22–32)
CREATININE: 0.68 mg/dL (ref 0.44–1.00)
Chloride: 111 mmol/L (ref 101–111)
GFR calc Af Amer: >60 mL/min (ref >60)
GFR calc non Af Amer: >60 mL/min (ref >60)
GLUCOSE: 156 mg/dL — AB (ref 65–99)
POTASSIUM: 3.6 mmol/L (ref 3.5–5.1)
SODIUM: 142 mmol/L (ref 135–145)
Total Protein: 6.8 g/dL (ref 6.5–8.1)

## 2017-03-31 LAB — FOLATE RBC
FOLATE, HEMOLYSATE: 322.7 ng/mL
Folate, RBC: 1055 ng/mL (ref >498)
HEMATOCRIT: 30.6 % — AB (ref 34.0–46.6)

## 2017-03-31 LAB — PHOSPHORUS: PHOSPHORUS: 3 mg/dL (ref 2.5–4.6)

## 2017-03-31 LAB — MAGNESIUM: Magnesium: 2 mg/dL (ref 1.7–2.4)

## 2017-03-31 LAB — HEMOGLOBIN A1C
HEMOGLOBIN A1C: 5 % (ref 4.8–5.6)
MEAN PLASMA GLUCOSE: 97 mg/dL

## 2017-03-31 MED ORDER — DEXTROSE-NACL 5-0.9 % IV SOLN
INTRAVENOUS | Status: DC
  Administered 2017-03-31: 20:00:00 via INTRAVENOUS

## 2017-03-31 MED ORDER — SODIUM CHLORIDE 3 % IN NEBU
4.0000 mL | INHALATION_SOLUTION | Freq: Every day | RESPIRATORY_TRACT | Status: AC
  Administered 2017-03-31 – 2017-04-02 (×3): 4 mL via RESPIRATORY_TRACT
  Filled 2017-03-31 (×3): qty 4

## 2017-03-31 MED ORDER — METHYLPREDNISOLONE SODIUM SUCC 125 MG IJ SOLR
60.0000 mg | Freq: Four times a day (QID) | INTRAMUSCULAR | Status: DC
  Administered 2017-03-31 – 2017-04-01 (×4): 60 mg via INTRAVENOUS
  Filled 2017-03-31 (×4): qty 2

## 2017-03-31 MED ORDER — PANTOPRAZOLE SODIUM 40 MG IV SOLR
40.0000 mg | Freq: Every day | INTRAVENOUS | Status: DC
Start: 2017-03-31 — End: 2017-04-01
  Administered 2017-03-31 – 2017-04-01 (×2): 40 mg via INTRAVENOUS
  Filled 2017-03-31 (×2): qty 40

## 2017-03-31 MED ORDER — ENOXAPARIN SODIUM 40 MG/0.4ML ~~LOC~~ SOLN
40.0000 mg | SUBCUTANEOUS | Status: DC
  Administered 2017-03-31 – 2017-04-03 (×3): 40 mg via SUBCUTANEOUS
  Filled 2017-03-31 (×3): qty 0.4

## 2017-03-31 MED ORDER — SODIUM CHLORIDE 0.9 % IV SOLN: INTRAVENOUS | Status: DC

## 2017-03-31 NOTE — Progress Notes (Signed)
IVF order d/c'd automatically but pt remains npo for swallowing study today.  Blima Rich NP on call and notified and she put in continue IVF order in care instructions.  I have added order for 1 more day.  Day MD, if you want to have the order for a longer duration, please add order, otherwise IVF will automatically be dc'd in 24 hours.

## 2017-03-31 NOTE — Progress Notes (Signed)
Daughter very concerned about her mother not being able to eat for 24hrs. She says she knows her mother and she is grabbing and sucking on the mouth swabs "like she is starving". Wants to give her ice cream and "will assume all responsibility for feeding her" before the swallow study is done. Paged MD and he explained to her the dangers and did not approve for feeding before swallow study. Daughter went to store and bought orange sherbert but decided NOT to give it to her mother in order to respect MD's care. Speech to see again tomorrow. Eulas Post, RN

## 2017-03-31 NOTE — Progress Notes (Signed)
PROGRESS NOTE    Bethany Fox  JIR:[id number] DOB: 08-10-1917 DOA: 03/29/2017 PCP: Odette Fraction, MD  Brief Narrative:  Patient is a 83 year old Caucasian female from SNF with a PMH of Dementia and recurrent PNA's who was found by family to have wheezing and congestion. She worsened and was taken to Urgent Care who recommended that she be evaluated in the ED and was admitted for dyspnea, wheezing, and tachycardia. Patient was found to have a Rhinovirus/Enterovirus and suspect Bronchitis as repeat CXR did not show any focal infiltrate but did show mild bronchitic changes. Because of concern of Aspiration, SLP was consulted for swallow evaluation but unfortunately she was not able to undergo evaluation as she remained somnolent/lethargic likely from the Ativan she received in the ED.   Assessment & Plan:   Active Problems:   Dementia   HTN (hypertension)   Wheezing   Anemia   Hyperglycemia   Pressure injury of skin  Acute Bronchitis with Cough/Wheezing/Dyspnea likely from Rhinovirus/ Enterovirus -CTA for PE was negative (D-Dimer was 0.81) but did show moderate to marked bronchitic changes and CXR this AM showed no focal infiltrate but did show mild mild bronchitic changes -Respiratory Panel Positive for Rhinovirus/Enterovirus -C/w Abx Coverage with Azithromycin IV and Ceftriaxone IV -C/w DuoNeb Breathing Tx q6h and q4hprn -Po Steroids were changed to Methylprednisolone IV yesterday; Increased dose to IV 60 Solumedrol q6h -Added Hypertonic Saline Nebs Daily for 3 days -Concern for Aspiration so will get SLP evaluation; Unable to be done today as patient remained Somnolent -C/w D5W NS at 50 mL/hr -ESR extremely high at 95 -Afebrile and WBC WNL  Acute Respiratory Failure with Hypoxia likely 2/2 to Above -C/w Supplemental O2 via [place]; Maintain O2 Saturations >92% -Wean O2 as Tolerated and C/w Continuous Pulse Oximetry  Dementia with Behavioral Disturbances -C/w [place] -Avoid  Benzodiazepines as she is now extremely Lethargic and Somnolent fro IV 0.5 mg Lorazepam  Somnolence -As Above; If does not improve consider Head CT  Iron Deficiency Normocytic Anemia -Anemia Panel showed Iron Level of 10, UIBC of 253, TIBC of 263, Saturation Ratios of 4 and Ferritin Level of 46 -Hb/Hct went from 10.3/31.0 -> 10.0/30.9  Hyperglycemia  -Likely in the setting of Infection -Glucose has been elevated and ranging from 156-173 -HbA1c was 5.0 -Will place on D5W+NS as patient is not eating   Tachycardia, improved -R/o'd Out PE -Likely from Infectious Bronchitis vs possible DuoNeb -TSH was 0.660 and Troponin <0.03 x3  Thrombocytopenia -Patient's Platelet Count now 136 -Continue to Monitor CBC  Hx of Breast Cancer -No Active Issues  Hx of Gastric Ulcer -Will start IV PPI Daily as patient is on High Dose Steroids  DVT prophylaxis: Lovenox 40 mg sq q24h and SCD's Code Status: DO NOT RESUSCITATE Family Communication: Discussed with Daughter and Family at bedside Disposition Plan: Back to [facility] when medically stable  Consultants:   None   Procedures: None   Antimicrobials:  Anti-infectives    Start     Dose/Rate Route Frequency Ordered Stop   03/30/17 1800  azithromycin (ZITHROMAX) 500 mg in dextrose 5 % 250 mL IVPB     500 mg 250 mL/hr over 60 Minutes Intravenous Every 24 hours 03/30/17 0242     03/30/17 0300  cefTRIAXone (ROCEPHIN) 1 g in dextrose 5 % 50 mL IVPB     1 g 100 mL/hr over 30 Minutes Intravenous Daily at bedtime 03/30/17 0242     03/29/17 1930  azithromycin (ZITHROMAX) tablet 500  mg     500 mg Oral  Once 03/29/17 1917 03/29/17 1954   03/29/17 1930  amoxicillin (AMOXIL) capsule 1,000 mg     1,000 mg Oral  Once 03/29/17 1917 03/29/17 1955     Subjective: Seen and examined at bedside and would not respond appropriately as she was still somnolent and sleepy. Was confused slightly still from dementia as well. No nausea or vomiting. Was  unable to undergo SLP eval because of her lethargy. No other concerns or complaints but daughter was wanting to feed her ice-cream and stated she "would take full responsibility if the patient aspirated." It was advised to the daughter that if the patient aspirates she could decompensate and worsen and daughter understood and held off on feeding her mom.   Objective: Vitals:   03/31/17 0529 03/31/17 0855 03/31/17 1426 03/31/17 1700  BP: 126/76  126/78   Pulse: 94 91 98 98  Resp: _0 Temp: 98.1 F (36.7 C)  98.1 F (36.7 C) 98.1 F (36.7 C)  TempSrc: Axillary  Axillary Axillary  SpO2: 95% 96% 95%   Weight: 55.8 kg (123 lb 0.3 oz)     Height:        Intake/Output Summary (Last 24 hours) at 03/31/17 1944 Last data filed at 03/31/17 1700  Gross per 24 hour  Intake           551.67 ml  Output                0 ml  Net           551.67 ml   Filed Weights   03/30/17 0418 03/31/17 0529  Weight: 55.9 kg (123 lb 3.8 oz) 55.8 kg (123 lb 0.3 oz)   Examination: Physical Exam:  Constitutional: NAD and appears calm and comfortable but somnolent and confused Eyes: Lids and conjunctivae normal, sclerae anicteric  ENMT: External Ears, Nose appear normal.  Neck: Appears normal, supple, no cervical masses, normal ROM, no appreciable thyromegaly, no JVD Respiratory: Diminished to auscultation bilaterally with expiratory wheezing and has wet cough. No rales, rhonchi or crackles. Normal respiratory effort and patient is not tachypenic. No accessory muscle use.  Cardiovascular: RRR, no murmurs / rubs / gallops. S1 and S2 auscultated. No extremity edema.  Abdomen: Soft, non-tender, non-distended. No masses palpated. No appreciable hepatosplenomegaly. Bowel sounds positive x4.  GU: Deferred. Musculoskeletal: No clubbing / cyanosis of digits/nails. No joint deformity upper and lower extremities.  Skin: No rashes, lesions, ulcers. No induration; Warm and dry.  Neurologic: Not alert or awake and  appeared somnolent. Opened eyes to physical stimuli Psychiatric: Impaired judgment and insight.  Data Reviewed: I have personally reviewed following labs and imaging studies  CBC:  Recent Labs Lab 03/30/17 0026 03/30/17 0521 03/31/17 0528  WBC 8.9 7.2 9.5  NEUTROABS 8.3*  --  8.7*  HGB 10.2* 10.3* 10.0*  HCT 31.0* 31.0*  30.6* 30.9*  MCV 88.3 88.3 86.1  PLT 143* 145* 638*   Basic Metabolic Panel:  Recent Labs Lab 03/30/17 0026 03/30/17 0521 03/31/17 0528  NA 139 140 142  K 3.6 3.5 3.6  CL 108 108 111  CO2 _1 GLUCOSE 178* 173* 156*  BUN _2 CREATININE 0.76 0.76 0.68  CALCIUM 8.6* 8.5* 8.5*  MG  --   --  2.0  PHOS  --   --  3.0   GFR: Estimated Creatinine Clearance: 31.5 mL/min (by C-G formula based on SCr  of 0.68 mg/dL). Liver Function Tests:  Recent Labs Lab 03/30/17 0521 03/31/17 0528  AST 20 21  ALT 12* 12*  ALKPHOS 62 59  BILITOT 0.9 0.5  PROT 6.9 6.8  ALBUMIN 2.9* 3.1*   No results for input(s): LIPASE, AMYLASE in the last 168 hours. No results for input(s): AMMONIA in the last 168 hours. Coagulation Profile: No results for input(s): INR, PROTIME in the last 168 hours. Cardiac Enzymes:  Recent Labs Lab 03/30/17 0245 03/30/17 0835 03/30/17 1449  TROPONINI <0.03 <0.03 <0.03   BNP (last 3 results) No results for input(s): PROBNP in the last 8760 hours. HbA1C:  Recent Labs  03/30/17 0521  HGBA1C 5.0   CBG: No results for input(s): GLUCAP in the last 168 hours. Lipid Profile: No results for input(s): CHOL, HDL, LDLCALC, TRIG, CHOLHDL, LDLDIRECT in the last 72 hours. Thyroid Function Tests:  Recent Labs  03/30/17 0521  TSH 0.660   Anemia Panel:  Recent Labs  03/30/17 0521  VITAMINB12 338  FERRITIN 46  TIBC 263  IRON 10*   Sepsis Labs: No results for input(s): PROCALCITON, LATICACIDVEN in the last 168 hours.  Recent Results (from the past 240 hour(s))  MRSA PCR Screening     Status: None   Collection Time:  03/30/17  4:36 AM  Result Value Ref Range Status   MRSA by PCR NEGATIVE NEGATIVE Final    Comment:        The GeneXpert MRSA Assay (FDA approved for NASAL specimens only), is one component of a comprehensive MRSA colonization surveillance program. It is not intended to diagnose MRSA infection nor to guide or monitor treatment for MRSA infections.   Respiratory Panel by PCR     Status: Abnormal   Collection Time: 03/30/17 10:47 AM  Result Value Ref Range Status   Adenovirus NOT DETECTED NOT DETECTED Final   Coronavirus 229E NOT DETECTED NOT DETECTED Final   Coronavirus HKU1 NOT DETECTED NOT DETECTED Final   Coronavirus NL63 NOT DETECTED NOT DETECTED Final   Coronavirus OC43 NOT DETECTED NOT DETECTED Final   Metapneumovirus NOT DETECTED NOT DETECTED Final   Rhinovirus / Enterovirus DETECTED (A) NOT DETECTED Final   Influenza A NOT DETECTED NOT DETECTED Final   Influenza B NOT DETECTED NOT DETECTED Final   Parainfluenza Virus 1 NOT DETECTED NOT DETECTED Final   Parainfluenza Virus 2 NOT DETECTED NOT DETECTED Final   Parainfluenza Virus 3 NOT DETECTED NOT DETECTED Final   Parainfluenza Virus 4 NOT DETECTED NOT DETECTED Final   Respiratory Syncytial Virus NOT DETECTED NOT DETECTED Final   Bordetella pertussis NOT DETECTED NOT DETECTED Final   Chlamydophila pneumoniae NOT DETECTED NOT DETECTED Final   Mycoplasma pneumoniae NOT DETECTED NOT DETECTED Final    Comment: Performed at [facility], [place]. [address] [place], [place] [address]    Radiology Studies: Ct Angio Chest Pe W Or Wo Contrast  Result Date: 03/30/2017 CLINICAL DATA:  Wheezing, shortness of breath, tachycardia, elevated D-dimer, hypoxia. EXAM: CT ANGIOGRAPHY CHEST WITH CONTRAST TECHNIQUE: Multidetector CT imaging of the chest was performed using the standard protocol during bolus administration of intravenous contrast. Multiplanar CT image reconstructions and MIPs were obtained to evaluate the vascular anatomy.  CONTRAST:  100 cc Isovue 370 COMPARISON:  Chest radiographs obtained yesterday. FINDINGS: Cardiovascular: Normally opacified pulmonary arteries with no pulmonary arterial filling defects seen. Aortic and coronary artery calcifications. Biatrial enlargement. Mediastinum/Nodes: No enlarged lymph nodes. 2 mm left lobe thyroid nodule. Lungs/Pleura: Biapical pleural and parenchymal scarring.  Diffuse peribronchial thickening. Minimal right lower lobe atelectasis. No pleural fluid. Upper Abdomen: Mild intrahepatic biliary ductal dilatation, within normal limits for a patient of this age who has had a cholecystectomy. Musculoskeletal: Approximately 25% T12 superior endplate compression deformity with minimal bony retropulsion and mild anterior spur formation. No acute fracture lines. Multilevel degenerative changes. Review of the MIP images confirms the above findings. IMPRESSION: 1. No pulmonary emboli or acute abnormality. 2. Moderate to marked bronchitic changes. 3. 25% old T12 compression fracture. Electronically Signed   By: Claudie Revering M.D.   On: 03/30/2017 09:21   Dg Chest Port 1 View  Result Date: 03/31/2017 CLINICAL DATA:  Wheezing. EXAM: PORTABLE CHEST 1 VIEW COMPARISON:  CT 03/30/2017.  Chest x-ray 03/31/ 2018. FINDINGS: Mediastinum and hilar structures are normal. Heart size normal. Mild bronchitic changes again noted. No focal infiltrate. No pleural effusion or pneumothorax. Surgical clips left axilla. IMPRESSION: Mild bronchitic changes again noted.  No focal infiltrate. Electronically Signed   By: [place]   On: 03/31/2017 06:49   Scheduled Meds: . azithromycin  500 mg Intravenous Q24H  . cefTRIAXone (ROCEPHIN)  IV  1 g Intravenous QHS  . enoxaparin (LOVENOX) injection  40 mg Subcutaneous Q24H  . ipratropium-albuterol  3 mL Nebulization Q6H  . methylPREDNISolone (SOLU-MEDROL) injection  60 mg Intravenous Q6H  . pantoprazole (PROTONIX) IV  40 mg Intravenous Daily  . sodium chloride flush   3 mL Intravenous Q12H  . sodium chloride HYPERTONIC  4 mL Nebulization Daily  . [START ON 04/05/2017] Vitamin D (Ergocalciferol)  50,000 Units Oral Q7 days   Continuous Infusions: . dextrose 5 % and 0.9% NaCl      LOS: 1 day   Kerney Elbe, DO [facility] Pager [phone]  If 7PM-7AM, please contact night-coverage [email or web address] Password [facility] 03/31/2017, 7:44 PM

## 2017-03-31 NOTE — Progress Notes (Signed)
Initial Nutrition Assessment  DOCUMENTATION CODES:   Not applicable  INTERVENTION:   RD will order supplements when diet advanced after SLP evaluation.   NUTRITION DIAGNOSIS:   Inadequate oral intake related to inability to eat as evidenced by NPO status.  GOAL:   Patient will meet greater than or equal to 90% of their needs  MONITOR:   Diet advancement, Labs, Weight trends, Skin  REASON FOR ASSESSMENT:   Malnutrition Screening Tool    ASSESSMENT:   83 y.o. female, w dementia , hx of pneumonia apparently was found by family on Friday to be wheezing and congested.  Pt seemed worse on Saturday and therefore went to urgent care at [place] and sent to ED for evaluation.    Unable to talk to pt as pt with dementia. Patient lying in bed with mitts in place. Pt currently NPO waiting SLP evaluation. Pt with multiple Stage I pressure injuries. Per chart, pt has lost 7lbs(6%) in two weeks; this is severe. RD will order supplements when diet advanced.    Medications reviewed and include: azithromycin, ceftriaxone, lovenox, solu-medrol, Vit D  Labs reviewed: Ca 8.5(L) adj. 9.22 wnl, Alb 3.1(L) cbgs- 178, 173, 156 x 24 hrs  Nutrition-Focused physical exam completed. Findings are no fat depletion, moderate muscle depletion in temporal regions, and no edema.   Diet Order:  Diet NPO time specified Except for: Sips with Meds  Skin:  Wound (see comment) (Stage I buttocks and heel)  Last BM:  none since admit  Height:   Ht Readings from Last 1 Encounters:  03/30/17 5' (1.524 m)    Weight:   Wt Readings from Last 1 Encounters:  03/31/17 123 lb 0.3 oz (55.8 kg)    Ideal Body Weight:  45.4 kg  BMI:  Body mass index is 24.03 kg/m.  Estimated Nutritional Needs:   Kcal:  1400-1600kcal/day   Protein:  73-84g/day   Fluid:  >1.4L/day   EDUCATION NEEDS:   No education needs identified at this time  Koleen Distance, RD, LDN Pager #[phone]

## 2017-03-31 NOTE — Evaluation (Signed)
SLP Cancellation Note  Patient Details Name: Bethany Fox MRN: [id number] DOB: 1917-10-31   Cancelled treatment:       Reason Eval/Treat Not Completed: Fatigue/lethargy limiting ability to participate  Pt lethargic   Luanna Salk, [facility] SLP [phone]

## 2017-04-01 DIAGNOSIS — D509 Iron deficiency anemia, unspecified: Secondary | ICD-10-CM

## 2017-04-01 DIAGNOSIS — J206 Acute bronchitis due to rhinovirus: Secondary | ICD-10-CM

## 2017-04-01 DIAGNOSIS — J9601 Acute respiratory failure with hypoxia: Secondary | ICD-10-CM

## 2017-04-01 LAB — CBC WITH DIFFERENTIAL/PLATELET
BASOS ABS: 0 10*3/uL (ref 0.0–0.1)
Basophils Relative: 0 %
EOS ABS: 0 10*3/uL (ref 0.0–0.7)
EOS PCT: 0 %
HCT: 30.7 % — ABNORMAL LOW (ref 36.0–46.0)
Hemoglobin: 9.9 g/dL — ABNORMAL LOW (ref 12.0–15.0)
Lymphocytes Relative: 5 %
Lymphs Abs: 0.6 10*3/uL — ABNORMAL LOW (ref 0.7–4.0)
MCH: 28.7 pg (ref 26.0–34.0)
MCHC: 32.2 g/dL (ref 30.0–36.0)
MCV: 89 fL (ref 78.0–100.0)
Monocytes Absolute: 0.2 10*3/uL (ref 0.1–1.0)
Monocytes Relative: 2 %
Neutro Abs: 10.8 10*3/uL — ABNORMAL HIGH (ref 1.7–7.7)
Neutrophils Relative %: 93 %
PLATELETS: 173 10*3/uL (ref 150–400)
RBC: 3.45 MIL/uL — AB (ref 3.87–5.11)
RDW: 16.3 % — ABNORMAL HIGH (ref 11.5–15.5)
WBC: 11.6 10*3/uL — ABNORMAL HIGH (ref 4.0–10.5)

## 2017-04-01 LAB — COMPREHENSIVE METABOLIC PANEL
ALT: 14 U/L (ref 14–54)
AST: 22 U/L (ref 15–41)
Albumin: 3 g/dL — ABNORMAL LOW (ref 3.5–5.0)
Alkaline Phosphatase: 59 U/L (ref 38–126)
Anion gap: 7 (ref 5–15)
BUN: 25 mg/dL — ABNORMAL HIGH (ref 6–20)
CHLORIDE: 113 mmol/L — AB (ref 101–111)
CO2: 24 mmol/L (ref 22–32)
CREATININE: 0.77 mg/dL (ref 0.44–1.00)
Calcium: 8.6 mg/dL — ABNORMAL LOW (ref 8.9–10.3)
GFR calc non Af Amer: >60 mL/min (ref >60)
Glucose, Bld: 173 mg/dL — ABNORMAL HIGH (ref 65–99)
Potassium: 3.5 mmol/L (ref 3.5–5.1)
SODIUM: 144 mmol/L (ref 135–145)
Total Bilirubin: 0.3 mg/dL (ref 0.3–1.2)
Total Protein: 6.6 g/dL (ref 6.5–8.1)

## 2017-04-01 LAB — MAGNESIUM: Magnesium: 2.1 mg/dL (ref 1.7–2.4)

## 2017-04-01 LAB — PHOSPHORUS: PHOSPHORUS: 2.8 mg/dL (ref 2.5–4.6)

## 2017-04-01 MED ORDER — METHYLPREDNISOLONE SODIUM SUCC 125 MG IJ SOLR
60.0000 mg | Freq: Two times a day (BID) | INTRAMUSCULAR | Status: DC
  Administered 2017-04-01 – 2017-04-02 (×2): 60 mg via INTRAVENOUS
  Filled 2017-04-01 (×2): qty 2

## 2017-04-01 MED ORDER — PANTOPRAZOLE SODIUM 40 MG PO TBEC: 40.0000 mg | DELAYED_RELEASE_TABLET | Freq: Every day | ORAL | Status: DC

## 2017-04-01 NOTE — Progress Notes (Signed)
Patient refusing to finish treatment. Patient becomes agitated. Will attempt at later time.

## 2017-04-01 NOTE — Evaluation (Signed)
Clinical/Bedside Swallow Evaluation Patient Details  Name: Bethany Fox MRN: [id number] Date of Birth: Sep 21, 1917  Today's Date: 04/01/2017 Time: SLP Start Time (ACUTE ONLY): 1122 SLP Stop Time (ACUTE ONLY): 1155 SLP Time Calculation (min) (ACUTE ONLY): 33 min  Past Medical History:  Past Medical History:  Diagnosis Date  . Breast cancer ([place])   . Cancer ([place])    breast  . Dementia   . Depression   . Gastric ulcer   . H/O mastectomy    Left side    Past Surgical History:  Past Surgical History:  Procedure Laterality Date  . APPENDECTOMY    . CHOLECYSTECTOMY    . MASTECTOMY    . ORIF HIP FRACTURE Right 07/01/2014   Procedure: OPEN REDUCTION INTERNAL FIXATION HIP;  Surgeon: Mauri Pole, MD;  Location: [facility];  Service: Orthopedics;  Laterality: Right;   HPI:  83 yo female adm to [facility] with cough, respiratory difficulties.  PMH + for pna, dementia, pt is a resident of [facility].  Swallow eval ordered but pt has been lethargic since admit.    Assessment / Plan / Recommendation Clinical Impression  Pt presents with clinical indications of dysphagia and is aspiration risk across consistencies.  Cough at baseline that is congested noted.  Delayed swallow present with occasional wet gurgly breathing and increased wheeze.  This was much more profound with icecream than applesauce, nectar juice, water.  Effective compensation strategies include use of dry spoon to mouth for swallow triggering, assuring pt swallows, and providing rest breaks if pt short of breath or coughing.  Lengthy discussion with daughter regarding aspiration risk and her goals.  Daughter desires pt to eat with known aspiration risk - therefore would recommend dys1/thin currently. will follow clinically for needed modification and ongoing education/tolerance.  Do not recommend solids at this time due to work of breathing and aspiration risk.   SLP Visit Diagnosis: Dysphagia, oropharyngeal phase (R13.12)     Aspiration Risk  Moderate aspiration risk;Risk for inadequate nutrition/hydration    Diet Recommendation Thin liquid;Dysphagia 1 (Puree) (for comfort)   Liquid Administration via: Cup;Straw;Spoon Medication Administration: Whole meds with puree Supervision: Staff to assist with self feeding;Full supervision/cueing for compensatory strategies Compensations: Slow rate;Small sips/bites Postural Changes: Seated upright at 90 degrees;Remain upright for at least 30 minutes after po intake    Other  Recommendations Oral Care Recommendations: Oral care BID   Follow up Recommendations        Frequency and Duration min 2x/week  1 week       Prognosis Prognosis for Safe Diet Advancement: Fair Barriers to Reach Goals: Cognitive deficits;Time post onset;Severity of deficits;Behavior      Swallow Study   General Date of Onset: 04/01/17 HPI: 83 yo female adm to [facility] with cough, respiratory difficulties.  PMH + for pna, dementia, pt is a resident of [facility].  Swallow eval ordered but pt has been lethargic since admit.  Type of Study: Bedside Swallow Evaluation Diet Prior to this Study: NPO Temperature Spikes Noted: No Respiratory Status: Nasal cannula History of Recent Intubation: No Behavior/Cognition: Alert;Confused;Impulsive;Distractible;Doesn't follow directions Oral Cavity Assessment: Dry Oral Care Completed by SLP: No Oral Cavity - Dentition: Adequate natural dentition Vision: Impaired for self-feeding Self-Feeding Abilities: Total assist Patient Positioning: Upright in bed Baseline Vocal Quality: Low vocal intensity;Breathy Volitional Cough: Cognitively unable to elicit Volitional Swallow: Unable to elicit    Oral/Motor/Sensory Function Overall Oral Motor/Sensory Function: Generalized oral weakness   Ice Chips Ice chips: Impaired Presentation:  Spoon Oral Phase Impairments: Impaired mastication Oral Phase Functional Implications: Prolonged oral transit Pharyngeal Phase  Impairments: Suspected delayed Swallow   Thin Liquid Thin Liquid: Impaired Presentation: Spoon Oral Phase Impairments: Reduced labial seal;Reduced lingual movement/coordination Oral Phase Functional Implications: Prolonged oral transit Pharyngeal  Phase Impairments: Suspected delayed Swallow    Nectar Thick Nectar Thick Liquid: Impaired Presentation: Spoon;Straw Oral Phase Impairments: Reduced labial seal;Reduced lingual movement/coordination Oral phase functional implications: Prolonged oral transit Pharyngeal Phase Impairments: Suspected delayed Swallow Other Comments: minimal anterior labial loss on right   Honey Thick Honey Thick Liquid: Not tested   Puree Puree: Impaired Presentation: Spoon Oral Phase Impairments: Reduced lingual movement/coordination Oral Phase Functional Implications: Prolonged oral transit Pharyngeal Phase Impairments: Suspected delayed Swallow   Solid   GO   Solid: Not tested Other Comments: secondary to respiratory status       Luanna Salk, [facility] SLP [phone]

## 2017-04-01 NOTE — Progress Notes (Signed)
Pt from [facility] and plan to return at discharge.

## 2017-04-01 NOTE — Progress Notes (Signed)
PROGRESS NOTE    Bethany Fox  TZG:[id number] DOB: [birth year] DOA: 03/29/2017 PCP: Odette Fraction, MD  Brief Narrative:  Patient is a 83 year old Caucasian female from SNF with a PMH of Dementia and recurrent PNA's who was found by family to have wheezing and congestion. She worsened and was taken to Urgent Care who recommended that she be evaluated in the ED and was admitted for dyspnea, wheezing, and tachycardia. Patient was found to have a Rhinovirus/Enterovirus and suspect Bronchitis as repeat CXR did not show any focal infiltrate but did show mild bronchitic changes. Because of concern of Aspiration, SLP was consulted for swallow evaluation who state that she is at risk for aspirating and because of daughters desire to feed the patient despite known aspiration risk SLP recommended Dysphagia 1 with Thin Liquids.   Assessment & Plan:   Principal Problem:   Acute bronchitis due to Rhinovirus Active Problems:   Dementia   HTN (hypertension)   Wheezing   Anemia   Hyperglycemia   Pressure injury of skin   Acute respiratory failure with hypoxia (HCC)   Iron deficiency anemia  Acute Bronchitis with Cough/Wheezing/Dyspnea likely from Rhinovirus/ Enterovirus, slowly improving -CTA for PE was negative (D-Dimer was 0.81) but did show moderate to marked bronchitic changes and CXR this AM showed no focal infiltrate but did show mild mild bronchitic changes -Respiratory Panel Positive for Rhinovirus/Enterovirus -C/w Abx Coverage with Azithromycin IV and Ceftriaxone IV -C/w DuoNeb Breathing Tx q6h and q4hprn -Decreased dose of IV 60 Solumedrol to q12h -Added Hypertonic Saline Nebs Daily for 3 days -Moderate Risk for Aspiration so will place on Dysphagia 1 Diet; Daughter states that mother did not want Tube Feedings -Will Stop D5W NS at 50 mL/hr now that patient has Dysphagia Diet and is +2.3 Liters -ESR extremely high at 95 -Afebrile (Temp of 99) and WBC Slightly elevated now -Repeat  CXR in AM  Acute Respiratory Failure with Hypoxia likely 2/2 to Above -C/w Supplemental O2 via [place]; Maintain O2 Saturations >92% -Wean O2 as Tolerated and C/w Continuous Pulse Oximetry -Per Nurse Weaned down to 1 Liter  Dementia with Behavioral Disturbances -C/w [place] -Avoid Benzodiazepines as she became extremely Lethargic and Somnolent fro IV 0.5 mg Lorazepam  Somnolence, improved -As Above;  -Consider Head CT if worsens  Hypertension/Elevated BP -Was previously on Amlodipine 5 mg po Daily as an outpatient but apparently not taking -Will continue to Monitor BP's and Add Hydralazine IV as necessary if BP remains elevated  Leukocytosis -Patient's WBC went from 9.5 -> 11.6 -Likely from IV Methylprednisolone -Repeat CBC in AM  Iron Deficiency Normocytic Anemia -Anemia Panel showed Iron Level of 10, UIBC of 253, TIBC of 263, Saturation Ratios of 4 and Ferritin Level of 46 -Hb/Hct went from 10.3/31.0 -> 10.0/30.9 -> 9.9/30.7 -Will need Iron Supplementation.  -Repeat CBC in AM  Hyperglycemia  -Likely in the setting of Infection and IV Steroids along with D5W -Glucose has been elevated and ranging from 156-178 -HbA1c was 5.0 -Will place on D5W+NS as patient is not eating   Tachycardia, improved -R/o'd Out PE -Likely from Infectious Bronchitis vs possible DuoNeb -TSH was 0.660 and Troponin <0.03 x3  Thrombocytopenia, improved -Patient's Platelet Count now 173 -Continue to Monitor CBC  Hx of Breast Cancer -No Active Issues  Hx of Gastric Ulcer -Will Change IV PPI Daily to po Protonix Daily now that patient has Diet  DVT prophylaxis: Lovenox 40 mg sq q24h and SCD's Code Status: DO NOT RESUSCITATE Family  Communication: Discussed with Daughter and Family at bedside Disposition Plan: Back to [facility] when medically stable  Consultants:   None   Procedures: None   Antimicrobials:  Anti-infectives    Start     Dose/Rate Route Frequency Ordered Stop    03/30/17 1800  azithromycin (ZITHROMAX) 500 mg in dextrose 5 % 250 mL IVPB     500 mg 250 mL/hr over 60 Minutes Intravenous Every 24 hours 03/30/17 0242     03/30/17 0300  cefTRIAXone (ROCEPHIN) 1 g in dextrose 5 % 50 mL IVPB     1 g 100 mL/hr over 30 Minutes Intravenous Daily at bedtime 03/30/17 0242     03/29/17 1930  azithromycin (ZITHROMAX) tablet 500 mg     500 mg Oral  Once 03/29/17 1917 03/29/17 1954   03/29/17 1930  amoxicillin (AMOXIL) capsule 1,000 mg     1,000 mg Oral  Once 03/29/17 1917 03/29/17 1955     Subjective: Seen and examined at bedside and was more responsive and interactive. Had no complaints and was getting agitated and almost tried to punch the nursing Staff. SLP done and patient at risk for aspiration but SLP recommends Dysphagia 1 diet as patient's daughter still wants to feed her. No complaints.   Objective: Vitals:   04/01/17 0643 04/01/17 0902 04/01/17 1358 04/01/17 1415  BP: (!) 173/78  (!) 158/88   Pulse: 98  (!) 106 94  Resp: '20  20 20  ' Temp: 98.3 F (36.8 C)  99 F (37.2 C)   TempSrc: Axillary  Oral   SpO2: 97% 94% 93% 95%  Weight: 57.2 kg (126 lb 1.7 oz)     Height:        Intake/Output Summary (Last 24 hours) at 04/01/17 1435 Last data filed at 04/01/17 0600  Gross per 24 hour  Intake           810.83 ml  Output                0 ml  Net           810.83 ml   Filed Weights   03/30/17 0418 03/31/17 0529 04/01/17 0643  Weight: 55.9 kg (123 lb 3.8 oz) 55.8 kg (123 lb 0.3 oz) 57.2 kg (126 lb 1.7 oz)   Examination: Physical Exam:  Constitutional: NAD and appears calm and comfortable Eyes: Lids and conjunctivae normal, sclerae anicteric  ENMT: External Ears, Nose appear normal.  Neck: Appears normal, supple, no cervical masses, normal ROM, no appreciable thyromegaly, no JVD Respiratory: Diminished to auscultation bilaterally with expiratory wheezing and has wet cough. No rales, rhonchi or crackles. Normal respiratory effort and patient is  not tachypenic. No accessory muscle use. Patient wearing supplemental O2 via [place] Cardiovascular: RRR, no murmurs / rubs / gallops. S1 and S2 auscultated. No extremity edema.  Abdomen: Soft, non-tender, non-distended. No masses palpated. No appreciable hepatosplenomegaly. Bowel sounds positive x4.  GU: Deferred. Musculoskeletal: No clubbing / cyanosis of digits/nails. No joint deformity upper and lower extremities.  Skin: No rashes, lesions, ulcers. No induration; Warm and dry.  Neurologic: Awake but not alert.Confused due to her Dementia. Opens eyes to physical and verbal stimuli Psychiatric: Impaired judgment and insight. Agitated mood and appropriate affect.   Data Reviewed: I have personally reviewed following labs and imaging studies  CBC:  Recent Labs Lab 03/30/17 0026 03/30/17 0521 03/31/17 0528 04/01/17 0522  WBC 8.9 7.2 9.5 11.6*  NEUTROABS 8.3*  --  8.7* 10.8*  HGB 10.2*  10.3* 10.0* 9.9*  HCT 31.0* 31.0*  30.6* 30.9* 30.7*  MCV 88.3 88.3 86.1 89.0  PLT 143* 145* 136* 889   Basic Metabolic Panel:  Recent Labs Lab 03/30/17 0026 03/30/17 0521 03/31/17 0528 04/01/17 0522  NA 139 140 142 144  K 3.6 3.5 3.6 3.5  CL 108 108 111 113*  CO2 '23 24 23 24  ' GLUCOSE 178* 173* 156* 173*  BUN '20 20 20 ' 25*  CREATININE 0.76 0.76 0.68 0.77  CALCIUM 8.6* 8.5* 8.5* 8.6*  MG  --   --  2.0 2.1  PHOS  --   --  3.0 2.8   GFR: Estimated Creatinine Clearance: 31.9 mL/min (by C-G formula based on SCr of 0.77 mg/dL). Liver Function Tests:  Recent Labs Lab 03/30/17 0521 03/31/17 0528 04/01/17 0522  AST '20 21 22  ' ALT 12* 12* 14  ALKPHOS 62 59 59  BILITOT 0.9 0.5 0.3  PROT 6.9 6.8 6.6  ALBUMIN 2.9* 3.1* 3.0*   No results for input(s): LIPASE, AMYLASE in the last 168 hours. No results for input(s): AMMONIA in the last 168 hours. Coagulation Profile: No results for input(s): INR, PROTIME in the last 168 hours. Cardiac Enzymes:  Recent Labs Lab 03/30/17 0245 03/30/17 0835  03/30/17 1449  TROPONINI <0.03 <0.03 <0.03   BNP (last 3 results) No results for input(s): PROBNP in the last 8760 hours. HbA1C:  Recent Labs  03/30/17 0521  HGBA1C 5.0   CBG: No results for input(s): GLUCAP in the last 168 hours. Lipid Profile: No results for input(s): CHOL, HDL, LDLCALC, TRIG, CHOLHDL, LDLDIRECT in the last 72 hours. Thyroid Function Tests:  Recent Labs  03/30/17 0521  TSH 0.660   Anemia Panel:  Recent Labs  03/30/17 0521  VITAMINB12 338  FERRITIN 46  TIBC 263  IRON 10*   Sepsis Labs: No results for input(s): PROCALCITON, LATICACIDVEN in the last 168 hours.  Recent Results (from the past 240 hour(s))  MRSA PCR Screening     Status: None   Collection Time: 03/30/17  4:36 AM  Result Value Ref Range Status   MRSA by PCR NEGATIVE NEGATIVE Final    Comment:        The GeneXpert MRSA Assay (FDA approved for NASAL specimens only), is one component of a comprehensive MRSA colonization surveillance program. It is not intended to diagnose MRSA infection nor to guide or monitor treatment for MRSA infections.   Respiratory Panel by PCR     Status: Abnormal   Collection Time: 03/30/17 10:47 AM  Result Value Ref Range Status   Adenovirus NOT DETECTED NOT DETECTED Final   Coronavirus 229E NOT DETECTED NOT DETECTED Final   Coronavirus HKU1 NOT DETECTED NOT DETECTED Final   Coronavirus NL63 NOT DETECTED NOT DETECTED Final   Coronavirus OC43 NOT DETECTED NOT DETECTED Final   Metapneumovirus NOT DETECTED NOT DETECTED Final   Rhinovirus / Enterovirus DETECTED (A) NOT DETECTED Final   Influenza A NOT DETECTED NOT DETECTED Final   Influenza B NOT DETECTED NOT DETECTED Final   Parainfluenza Virus 1 NOT DETECTED NOT DETECTED Final   Parainfluenza Virus 2 NOT DETECTED NOT DETECTED Final   Parainfluenza Virus 3 NOT DETECTED NOT DETECTED Final   Parainfluenza Virus 4 NOT DETECTED NOT DETECTED Final   Respiratory Syncytial Virus NOT DETECTED NOT DETECTED  Final   Bordetella pertussis NOT DETECTED NOT DETECTED Final   Chlamydophila pneumoniae NOT DETECTED NOT DETECTED Final   Mycoplasma pneumoniae NOT DETECTED NOT DETECTED Final  Comment: Performed at [facility], [place] [address] [place], [place] [address]    Radiology Studies: Dg Chest Port 1 View  Result Date: 03/31/2017 CLINICAL DATA:  Wheezing. EXAM: PORTABLE CHEST 1 VIEW COMPARISON:  CT 03/30/2017.  Chest x-ray 03/31/ 2018. FINDINGS: Mediastinum and hilar structures are normal. Heart size normal. Mild bronchitic changes again noted. No focal infiltrate. No pleural effusion or pneumothorax. Surgical clips left axilla. IMPRESSION: Mild bronchitic changes again noted.  No focal infiltrate. Electronically Signed   By: [place]   On: 03/31/2017 06:49   Scheduled Meds: . azithromycin  500 mg Intravenous Q24H  . cefTRIAXone (ROCEPHIN)  IV  1 g Intravenous QHS  . enoxaparin (LOVENOX) injection  40 mg Subcutaneous Q24H  . ipratropium-albuterol  3 mL Nebulization Q6H  . methylPREDNISolone (SOLU-MEDROL) injection  60 mg Intravenous Q12H  . [START ON 04/02/2017] pantoprazole  40 mg Oral Daily  . sodium chloride flush  3 mL Intravenous Q12H  . sodium chloride HYPERTONIC  4 mL Nebulization Daily  . [START ON 04/05/2017] Vitamin D (Ergocalciferol)  50,000 Units Oral Q7 days   Continuous Infusions:   LOS: 2 days   Kerney Elbe, DO [facility] Pager [phone]  If 7PM-7AM, please contact night-coverage [email or web address] Password [email or web address] 04/01/2017, 2:35 PM

## 2017-04-01 NOTE — Clinical Social Work Note (Signed)
Clinical Social Work Assessment  Patient Details  Name: Bethany Fox MRN: [id number] Date of Birth: [birth year]  Date of referral:  04/01/17               Reason for consult:  Facility Placement, Discharge Planning                Permission sought to share information with:  Facility Sport and exercise psychologist, Guardian, Family Supports Permission granted to share information::     Name::        Agency::  [facility]  Relationship::  daughter/guardian Caren Griffins cell [phone], home [phone], neice Cindy  Contact Information:     Housing/Transportation Living arrangements for the past 2 months:  [place] (LTC) Source of Information:  Guardian (daughter) Patient Interpreter Needed:  None Criminal Activity/Legal Involvement Pertinent to Current Situation/Hospitalization:  No - Comment as needed Significant Relationships:  Adult Children, Other Family Members Lives with:  Facility Resident Do you feel safe going back to the place where you live?  Yes Need for family participation in patient care:  Yes (Comment)  Care giving concerns:  Pt from [facility], where she has been resident since 2015. Pt has dementia, requiring assistance, and also uses wheelchair for ambulation.    Social Worker assessment / plan:  Pt is a 83 yr old female admitted to hospital 03/29/17 due to dyspnea, wheezing, and tachycardia. Pt from [facility].  CSW met with pt and family at bedside. Pt sleeping, and daughter Caren Griffins (contact # above) explains she is pt's "guardian and primary decision maker as she has advanced dementia." pt's granddaughter also present, and daughter explains niece Jenny Reichmann also involved. CSW explained role of social work to family.   Daughter reports pt was admitted at [facility] in summer 2015 for rehab and remained there for LTC. Currently is having bed held for pt and hoping pt will be able to return at d/c. Agreed to have CSW contact facility also. CSw spoke  with facility representative who confirms information above and requests updated information once available.  Plan TBD- will follow for discharge planning needs.  Employment status:  Retired Forensic scientist:  Medicare PT Recommendations:  Not assessed at this time [place] / Referral to community resources:  [place]  Patient/Family's Response to care:  Family expresses pleasure in care pt has received at current residential facility and during this hospital stay. Appreciates CSW will continue to follow and assist  Patient/Family's Understanding of and Emotional Response to Diagnosis, Current Treatment, and Prognosis:  Daughter demonstrates understanding of current treatment and awaits further plan. Positive and accepting attitude re: pt's status.   Emotional Assessment Appearance:  Appears stated age Attitude/Demeanor/Rapport:  UTA- pt sleeping Affect (typically observed):   (UTA- pt sleeping) Orientation:   (pt has dementia and per daughter is transiently oriented to place/self, disoriented to time/situation) Alcohol / Substance use:  Not Applicable Psych involvement (Current and /or in the community):  No (Comment)  Discharge Needs  Concerns to be addressed:  Discharge Planning Concerns Readmission within the last 30 days:  No Current discharge risk:  Cognitively Impaired (dementia) Barriers to Discharge:  No Barriers Identified   Nila Nephew, LCSW 04/01/2017, 4:13 PM

## 2017-04-01 NOTE — Progress Notes (Addendum)
  Speech Language Pathology Treatment: Dysphagia  Patient Details Name: Bethany Fox MRN: [id number] DOB: 14-Sep-1917 Today's Date: 04/01/2017 Time: 6967-8938 SLP Time Calculation (min) (ACUTE ONLY): 27 min  Assessment / Plan / Recommendation Clinical Impression  Educated daughter to plan and provided written instructions for aspiration mitigation.  Assisted pt to reposition for maximum comfort.    Daughter inquired regarding palliative - advised her to speak her MD.  Daughter states mother did not want feeding tube, advised that research does not support feeding tubes in pt's with cognitive deficits.    Will follow up for education with pt's daughter.    HPI HPI: 83 yo female adm to [facility] with cough, respiratory difficulties.  PMH + for pna, dementia, pt is a resident of [facility].  Swallow eval ordered but pt has been lethargic since admit.       SLP Plan  Continue with current plan of care       Recommendations  Diet recommendations: Dysphagia 1 (puree);Thin liquid Medication Administration: Whole meds with puree Compensations: Slow rate;Small sips/bites Postural Changes and/or Swallow Maneuvers: Seated upright 90 degrees;Upright 30-60 min after meal                Oral Care Recommendations: Oral care BID SLP Visit Diagnosis: Dysphagia, oropharyngeal phase (R13.12) Plan: Continue with current plan of care       [place], [place], [place] [facility] SLP [phone]

## 2017-04-02 DIAGNOSIS — Z7189 Other specified counseling: Secondary | ICD-10-CM

## 2017-04-02 LAB — CBC WITH DIFFERENTIAL/PLATELET
BASOS ABS: 0 10*3/uL (ref 0.0–0.1)
BASOS PCT: 0 %
Eosinophils Absolute: 0 10*3/uL (ref 0.0–0.7)
Eosinophils Relative: 0 %
HEMATOCRIT: 31.5 % — AB (ref 36.0–46.0)
HEMOGLOBIN: 10.3 g/dL — AB (ref 12.0–15.0)
Lymphocytes Relative: 9 %
Lymphs Abs: 0.9 10*3/uL (ref 0.7–4.0)
MCH: 29.1 pg (ref 26.0–34.0)
MCHC: 32.7 g/dL (ref 30.0–36.0)
MCV: 89 fL (ref 78.0–100.0)
MONO ABS: 0.5 10*3/uL (ref 0.1–1.0)
Monocytes Relative: 4 %
NEUTROS ABS: 9.3 10*3/uL — AB (ref 1.7–7.7)
NEUTROS PCT: 87 %
Platelets: 140 10*3/uL — ABNORMAL LOW (ref 150–400)
RBC: 3.54 MIL/uL — ABNORMAL LOW (ref 3.87–5.11)
RDW: 15.9 % — AB (ref 11.5–15.5)
WBC: 10.7 10*3/uL — ABNORMAL HIGH (ref 4.0–10.5)

## 2017-04-02 LAB — COMPREHENSIVE METABOLIC PANEL
ALBUMIN: 3 g/dL — AB (ref 3.5–5.0)
ALK PHOS: 58 U/L (ref 38–126)
ALT: 15 U/L (ref 14–54)
AST: 26 U/L (ref 15–41)
Anion gap: 10 (ref 5–15)
BILIRUBIN TOTAL: 0.4 mg/dL (ref 0.3–1.2)
BUN: 19 mg/dL (ref 6–20)
CALCIUM: 8.4 mg/dL — AB (ref 8.9–10.3)
CO2: 25 mmol/L (ref 22–32)
CREATININE: 0.7 mg/dL (ref 0.44–1.00)
Chloride: 107 mmol/L (ref 101–111)
GFR calc Af Amer: >60 mL/min (ref >60)
GLUCOSE: 138 mg/dL — AB (ref 65–99)
Potassium: 3.7 mmol/L (ref 3.5–5.1)
Sodium: 142 mmol/L (ref 135–145)
TOTAL PROTEIN: 6.5 g/dL (ref 6.5–8.1)

## 2017-04-02 LAB — MAGNESIUM: MAGNESIUM: 2 mg/dL (ref 1.7–2.4)

## 2017-04-02 LAB — PHOSPHORUS: Phosphorus: 2.8 mg/dL (ref 2.5–4.6)

## 2017-04-02 MED ORDER — IPRATROPIUM-ALBUTEROL 0.5-2.5 (3) MG/3ML IN SOLN
3.0000 mL | Freq: Three times a day (TID) | RESPIRATORY_TRACT | Status: DC
  Filled 2017-04-02: qty 3

## 2017-04-02 MED ORDER — HALOPERIDOL LACTATE 5 MG/ML IJ SOLN: 1.0000 mg | Freq: Four times a day (QID) | INTRAMUSCULAR | Status: DC | PRN

## 2017-04-02 MED ORDER — RISPERIDONE 0.25 MG PO TABS
0.2500 mg | ORAL_TABLET | Freq: Every day | ORAL | Status: DC
  Filled 2017-04-02: qty 1

## 2017-04-02 NOTE — Progress Notes (Signed)
Chaplain stopped in to visit with patient and family because patient is transitioning to Palliative Care.  Daughter and granddaughter present in the room and appreciative of Chaplain presence.  Family talk about changes for the patient, their loved one and are not sure what the next few days will be like. Catholic family.  Very appreciative of Chaplain presence and of the care they are receiving from medical staff.    04/02/17 1654  Clinical Encounter Type  Visited With Patient and family together  Visit Type Initial;Spiritual support;Social support  Referral From Nurse  Stress Factors  Patient Stress Factors Health changes;Major life changes  Family Stress Factors Major life changes   Berneice Heinrich

## 2017-04-02 NOTE — Consult Note (Signed)
Consultation Note Date: 04/02/2017   Patient Name: Bethany Fox  DOB: [birth year]  MRN: [id number]  Age / Sex: 83 y.o., female  PCP: Susy Frizzle, MD Referring Physician: Doreatha Lew, MD  Reason for Consultation: Establishing goals of care  HPI/Patient Profile: 83 y.o. female  admitted on 03/29/2017  Patient is a 83 year old Caucasian female from SNF with a PMH of Dementia and recurrent PNA's who was found by family to have wheezing and congestion. She worsened and was taken to Urgent Care who recommended that she be evaluated in the ED and was admitted for dyspnea, wheezing, and tachycardia. Patient was found to have a Rhinovirus/Enterovirus and suspect Bronchitis as repeat CXR did not show any focal infiltrate but did show mild bronchitic changes. Because of concern of Aspiration, SLP was consulted for swallow evaluation who state that she is at risk for aspirating and because of daughters desire to feed the patient despite known aspiration risk SLP recommended Dysphagia 1 with Thin Liquids.   A palliative consult has been placed for additional goals of care discussions.   Clinical Assessment and Goals of Care:  The patient is an elderly lady resting in bed, she has her eyes open, she is in no distress. She is confused, she keeps on mumbling unintelligibly sometimes.   Discussed with bedside RN, who called patient's daughter and she arrived shortly there after at the bedside. I introduced myself and palliative care as follows:  Palliative medicine is specialized medical care for people living with serious illness. It focuses on providing relief from the symptoms and stress of a serious illness. The goal is to improve quality of life for both the patient and the family.  Brief life review performed, patient's parents were originally from [place], she lived in [place] for most of her adult life, moved to [place]  several years ago for her husband's work. 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 She had 3 daughters, husband is deceased, 2 daughters are deceased as well. Daughter Jenny Reichmann is her designated Psychiatrist. Patient had completed advanced directives several decades ago, in which she clearly mentioned a disapproval for tubes, lines, aggressive heroic measures. Patient has a quality of life worth preserving and maintaining at [facility].   We discussed extensively about the patient's dementia trajectory. Concepts specific to artificial nutrition and hydration discussed in detail. All questions answered to the best of my ability.   Patient was able to move around in her wheelchair, she was on mechanical soft diet at [facility]. She enjoyed meeting with other residents over there.   We discussed about addition of hospice as an extra layer of support. See below.   HCPOA  daughter Jenny Reichmann.   SUMMARY OF RECOMMENDATIONS   1. Recommend Hospice services to follow patient at [facility]. Patient's daughter is well familiar with [facility] and prefers for the patient to go to [facility] with hospice for now.  2. Comfort feeds with utmost aspiration precautions, daughter understands that  there is always a silent aspiration risk. No tube feeds, no PEG, no prolonged IVF hydration.  3. Daughter requests that the patient have bed rails on, at [facility] to avoid falls.   Code Status/Advance Care Planning:  DNR    Symptom Management:    as above   Palliative Prophylaxis:   Delirium Protocol   Psycho-social/Spiritual:   Desire for further Chaplaincy support:no  Additional Recommendations: Education on Hospice  Prognosis:   < 6 weeks  Discharge Planning: [place] with Hospice      Primary Diagnoses: Present on Admission: . Wheezing . HTN (hypertension) . Dementia   I have reviewed the medical record, interviewed the patient and  family, and examined the patient. The following aspects are pertinent.  Past Medical History:  Diagnosis Date  . Breast cancer ([place])   . Cancer ([place])    breast  . Dementia   . Depression   . Gastric ulcer   . H/O mastectomy    Left side    Social History   Social History  . Marital status: Widowed    Spouse name: N/A  . Number of children: N/A  . Years of education: N/A   Social History Main Topics  . Smoking status: Never Smoker  . Smokeless tobacco: Never Used  . Alcohol use No  . Drug use: No  . Sexual activity: Not Asked   Other Topics Concern  . None   Social History Narrative  . None   Family History  Problem Relation Age of Onset  . Family history unknown: Yes   Scheduled Meds: . enoxaparin (LOVENOX) injection  40 mg Subcutaneous Q24H  . ipratropium-albuterol  3 mL Nebulization Q6H  . methylPREDNISolone (SOLU-MEDROL) injection  60 mg Intravenous Q12H  . pantoprazole  40 mg Oral Daily  . sodium chloride flush  3 mL Intravenous Q12H  . [START ON 04/05/2017] Vitamin D (Ergocalciferol)  50,000 Units Oral Q7 days   Continuous Infusions: PRN Meds:.ipratropium-albuterol, ondansetron Medications Prior to Admission:  Prior to Admission medications   Medication Sig Start Date End Date Taking? Authorizing Provider  acetaminophen (TYLENOL) 500 MG tablet Take 500 mg by mouth every 6 (six) hours as needed for fever (or for pain).    Yes Historical Provider, MD  ipratropium-albuterol (DUONEB) 0.5-2.5 (3) MG/3ML SOLN Take 3 mLs by nebulization every 4 (four) hours as needed. 03/19/15  Yes Robbie Lis, MD  magnesium hydroxide (MILK OF MAGNESIA) 400 MG/5ML suspension Take 30 mLs by mouth daily as needed for mild constipation.   Yes Historical Provider, MD  ondansetron (ZOFRAN) 4 MG tablet Take 4 mg by mouth daily as needed for nausea. 02/24/17  Yes Historical Provider, MD  Vitamin D, Ergocalciferol, (DRISDOL) 50000 units CAPS capsule Take 50,000 Units by mouth every 7  (seven) days.   Yes Historical Provider, MD  amLODipine (NORVASC) 5 MG tablet Take 1 tablet (5 mg total) by mouth daily. Patient not taking: Reported on 03/29/2017 07/04/14   Eugenie Filler, MD  amoxicillin-clavulanate (AUGMENTIN) 875-125 MG per tablet Take 1 tablet by mouth 2 (two) times daily. Patient not taking: Reported on 03/29/2017 03/19/15   Robbie Lis, MD  HYDROcodone-acetaminophen (NORCO/VICODIN) 5-325 MG per tablet Take 1-2 tablets by mouth every 6 (six) hours as needed for moderate pain. Patient not taking: Reported on 03/29/2017 03/19/15   Robbie Lis, MD  LORazepam (ATIVAN) 0.5 MG tablet Take 0.5 tablets (0.25 mg total) by mouth every 8 (eight) hours as needed for anxiety.  Patient not taking: Reported on 03/29/2017 03/19/15   Robbie Lis, MD  mirtazapine (REMERON) 7.5 MG tablet Take 1 tablet (7.5 mg total) by mouth at bedtime. Patient not taking: Reported on 03/29/2017 03/19/15   Robbie Lis, MD  polyethylene glycol [facility] Floria Raveling) packet Take 17 g by mouth daily. Patient not taking: Reported on 03/29/2017 03/19/15   Robbie Lis, MD  risperiDONE (RISPERDAL) 0.25 MG tablet Take 1 tablet (0.25 mg total) by mouth at bedtime. Patient not taking: Reported on 03/29/2017 03/19/15   Robbie Lis, MD   Allergies  Allergen Reactions  . Adhesive [Tape]     Unknown   . Latex     rash  . Ciprofloxacin Rash   Review of Systems + for not able to speak more than 6 words, bed bound.   Physical Exam Elderly lady, no acute distress Coarse congested breath sounds anteriorly Weak cough S1 S2 Abdomen soft No edema Confused, has dementia. Eyes open. Is able to recognize daughter present in the room.   Vital Signs: BP 137/78 (BP Location: Right Arm)   Pulse 81   Temp 97.5 F (36.4 C) (Axillary)   Resp 20   Ht 5' (1.524 m)   Wt 57 kg (125 lb 10.6 oz)   SpO2 92%   BMI 24.54 kg/m  Pain Assessment: Faces   Pain Score: Asleep   SpO2: SpO2: 92 % O2 Device:SpO2: 92 % O2 Flow  Rate: .O2 Flow Rate (L/min): 0.5 L/min  IO: Intake/output summary:  Intake/Output Summary (Last 24 hours) at 04/02/17 1612 Last data filed at 04/02/17 1500  Gross per 24 hour  Intake              300 ml  Output                0 ml  Net              300 ml    LBM: Last BM Date:  (UTA) Baseline Weight: Weight: 55.9 kg (123 lb 3.8 oz) Most recent weight: Weight: 57 kg (125 lb 10.6 oz)     Palliative Assessment/Data:   Flowsheet Rows     Most Recent Value  Intake Tab  Referral Department  Hospitalist  Unit at Time of Referral  Med/Surg Unit  Palliative Care Primary Diagnosis  Neurology  Palliative Care Type  New Palliative care  Reason for referral  Clarify Goals of Care  Date first seen by Palliative Care  04/02/17  Clinical Assessment  Palliative Performance Scale Score  20%  Pain Max last 24 hours  4  Pain Min Last 24 hours  3  Dyspnea Max Last 24 Hours  4  Dyspnea Min Last 24 hours  3  Nausea Max Last 24 Hours  1  Nausea Min Last 24 Hours  0  Anxiety Max Last 24 Hours  4  Anxiety Min Last 24 Hours  3  Psychosocial & Spiritual Assessment  Palliative Care Outcomes  Patient/Family meeting held?  Yes  Who was at the meeting?  daughter Jenny Reichmann and grand daughter   Palliative Care Outcomes  Clarified goals of care      Time In:  1500 Time Out:  1615 Time Total:  75 Greater than 50%  of this time was spent counseling and coordinating care related to the above assessment and plan.  Signed by: Loistine Chance, MD  [phone]  Please contact Palliative Medicine Team phone at [phone] for questions and concerns.  For individual provider: See Shea Evans

## 2017-04-02 NOTE — Progress Notes (Addendum)
PROGRESS NOTE [facility]   MARIBEL HADLEY   VXB:[id number] DOB: [birth year]  DOA: 03/29/2017 PCP: Odette Fraction, MD   Brief Narrative:  Patient is a 83 year old Caucasian female from SNF with a PMH of Dementia and recurrent PNA's who was found by family to have wheezing and congestion. She worsened and was taken to Urgent Care who recommended that she be evaluated in the ED and was admitted for dyspnea, wheezing, and tachycardia. Patient was found to have a Rhinovirus/Enterovirus and suspect Bronchitis as repeat CXR did not show any focal infiltrate but did show mild bronchitic changes. Because of concern of Aspiration, SLP was consulted for swallow evaluation who state that she is at risk for aspirating and because of daughters desire to feed the patient despite known aspiration risk SLP recommended Dysphagia 1 with Thin Liquids.   Subjective: Patient seen and examined with daughter at bedside. Lethargic non responsive. Afebrile, breathing well on room air.   Assessment & Plan: Acute respiratory failure with hypoxia due to bronchitis  PE was ruled out, No signs of PNA, Likely silent aspiration  Acute viral bronchitis due to Rhinovirus/Enterovirus - no significant improvement. Discussed with daughter prognosis, given poor improvement patient is not likely to do well. Patient is high risk for aspiration and family want to continue feedings as long as she tolerates. Given the aspiration risk was explained that she is going to get more PNA and this will put her in danger. Family understood. I offered a palliative care consult for end of life discussion for which they agree. Subsequently family decided to keep patient comfortable and have hospice services follow.  Will d/c abx as this is viral etiology, and no improvement despite treatment  D/c Solumedrol  Continue nebulizer PRN  O2 as needed   Dementia  High risk for sundowning - will add haldol and risperidone qHS  Monitor    HTN - BP stable not on medications as outpatient Continue to monitor   Iron deficiency anemia  Continue to monitor   Goal of care - advised to keep comfort feed, also discussed the risk of aspiration, family verbalized understanding, No IVF or PEG or NGT - Palliative consult appreciated  For SNF with hospices services   DVT prophylaxis: SCD's  Code Status: DNR  Family Communication: Daughter at bedside  Disposition Plan: SNF with Hospice in AM   Consultants:   Palliative care    Procedures:   Antimicrobials:  Azithro and Ceftriaxone 03/29/17-04/01/17   Objective: Vitals:   04/02/17 0203 04/02/17 0651 04/02/17 0859 04/02/17 1450  BP:  (!) 155/89  137/78  Pulse:  65 82 81  Resp:  20 18 20   Temp:  97.1 F (36.2 C)  97.5 F (36.4 C)  TempSrc:  Axillary  Axillary  SpO2: 94% 97% 90% 92%  Weight:  57 kg (125 lb 10.6 oz)    Height:        Intake/Output Summary (Last 24 hours) at 04/02/17 1713 Last data filed at 04/02/17 1500  Gross per 24 hour  Intake              300 ml  Output                0 ml  Net              300 ml   Filed Weights   03/31/17 0529 04/01/17 0643 04/02/17 0651  Weight: 55.8 kg (123 lb 0.3 oz) 57.2 kg (126 lb 1.7 oz)  57 kg (125 lb 10.6 oz)    Examination:  General exam: Sleeping, lethargic  HEENT: OP moist and clear Respiratory system: Clear to auscultation. No wheezes,crackle or rhonchi Cardiovascular system: S1S2 RRR, 2/6 SM  Gastrointestinal system: Abdomen is nondistended, soft and nontender.  Central nervous system: Lethargic, not following commands . Extremities: No pedal edema.  Skin: No rashes  Data Reviewed: I have personally reviewed following labs and imaging studies  CBC:  Recent Labs Lab 03/30/17 0026 03/30/17 0521 03/31/17 0528 04/01/17 0522 04/02/17 0532  WBC 8.9 7.2 9.5 11.6* 10.7*  NEUTROABS 8.3*  --  8.7* 10.8* 9.3*  HGB 10.2* 10.3* 10.0* 9.9* 10.3*  HCT 31.0* 31.0*  30.6* 30.9* 30.7* 31.5*  MCV 88.3 88.3  86.1 89.0 89.0  PLT 143* 145* 136* 173 390*   Basic Metabolic Panel:  Recent Labs Lab 03/30/17 0026 03/30/17 0521 03/31/17 0528 04/01/17 0522 04/02/17 0532  NA 139 140 142 144 142  K 3.6 3.5 3.6 3.5 3.7  CL 108 108 111 113* 107  CO2 23 24 23 24 25   GLUCOSE 178* 173* 156* 173* 138*  BUN 20 20 20  25* 19  CREATININE 0.76 0.76 0.68 0.77 0.70  CALCIUM 8.6* 8.5* 8.5* 8.6* 8.4*  MG  --   --  2.0 2.1 2.0  PHOS  --   --  3.0 2.8 2.8   GFR: Estimated Creatinine Clearance: 31.1 mL/min (by C-G formula based on SCr of 0.7 mg/dL). Liver Function Tests:  Recent Labs Lab 03/30/17 0521 03/31/17 0528 04/01/17 0522 04/02/17 0532  AST 20 21 22 26   ALT 12* 12* 14 15  ALKPHOS 62 59 59 58  BILITOT 0.9 0.5 0.3 0.4  PROT 6.9 6.8 6.6 6.5  ALBUMIN 2.9* 3.1* 3.0* 3.0*   No results for input(s): LIPASE, AMYLASE in the last 168 hours. No results for input(s): AMMONIA in the last 168 hours. Coagulation Profile: No results for input(s): INR, PROTIME in the last 168 hours. Cardiac Enzymes:  Recent Labs Lab 03/30/17 0245 03/30/17 0835 03/30/17 1449  TROPONINI <0.03 <0.03 <0.03   BNP (last 3 results) No results for input(s): PROBNP in the last 8760 hours. HbA1C: No results for input(s): HGBA1C in the last 72 hours. CBG: No results for input(s): GLUCAP in the last 168 hours. Lipid Profile: No results for input(s): CHOL, HDL, LDLCALC, TRIG, CHOLHDL, LDLDIRECT in the last 72 hours. Thyroid Function Tests: No results for input(s): TSH, T4TOTAL, FREET4, T3FREE, THYROIDAB in the last 72 hours. Anemia Panel: No results for input(s): VITAMINB12, FOLATE, FERRITIN, TIBC, IRON, RETICCTPCT in the last 72 hours. Sepsis Labs: No results for input(s): PROCALCITON, LATICACIDVEN in the last 168 hours.  Recent Results (from the past 240 hour(s))  MRSA PCR Screening     Status: None   Collection Time: 03/30/17  4:36 AM  Result Value Ref Range Status   MRSA by PCR NEGATIVE NEGATIVE Final     Comment:        The GeneXpert MRSA Assay (FDA approved for NASAL specimens only), is one component of a comprehensive MRSA colonization surveillance program. It is not intended to diagnose MRSA infection nor to guide or monitor treatment for MRSA infections.   Respiratory Panel by PCR     Status: Abnormal   Collection Time: 03/30/17 10:47 AM  Result Value Ref Range Status   Adenovirus NOT DETECTED NOT DETECTED Final   Coronavirus 229E NOT DETECTED NOT DETECTED Final   Coronavirus HKU1 NOT DETECTED NOT DETECTED Final   Coronavirus  NL63 NOT DETECTED NOT DETECTED Final   Coronavirus OC43 NOT DETECTED NOT DETECTED Final   Metapneumovirus NOT DETECTED NOT DETECTED Final   Rhinovirus / Enterovirus DETECTED (A) NOT DETECTED Final   Influenza A NOT DETECTED NOT DETECTED Final   Influenza B NOT DETECTED NOT DETECTED Final   Parainfluenza Virus 1 NOT DETECTED NOT DETECTED Final   Parainfluenza Virus 2 NOT DETECTED NOT DETECTED Final   Parainfluenza Virus 3 NOT DETECTED NOT DETECTED Final   Parainfluenza Virus 4 NOT DETECTED NOT DETECTED Final   Respiratory Syncytial Virus NOT DETECTED NOT DETECTED Final   Bordetella pertussis NOT DETECTED NOT DETECTED Final   Chlamydophila pneumoniae NOT DETECTED NOT DETECTED Final   Mycoplasma pneumoniae NOT DETECTED NOT DETECTED Final    Comment: Performed at [facility], [place] [address], [place], [place] [address]         Radiology Studies: No results found.    Scheduled Meds: . enoxaparin (LOVENOX) injection  40 mg Subcutaneous Q24H  . ipratropium-albuterol  3 mL Nebulization Q6H  . pantoprazole  40 mg Oral Daily  . sodium chloride flush  3 mL Intravenous Q12H  . [START ON 04/05/2017] Vitamin D (Ergocalciferol)  50,000 Units Oral Q7 days   Continuous Infusions:   LOS: 3 days    Chipper Oman, MD Pager: Text Page via [email or web address]  [phone]  If 7PM-7AM, please contact night-coverage [email or web address] Password [email or web address] 04/02/2017,  5:13 PM

## 2017-04-02 NOTE — Progress Notes (Signed)
Initial Nutrition Assessment  DOCUMENTATION CODES:   Not applicable  INTERVENTION:   Magic cup TID with meals, each supplement provides 290 kcal and 9 grams of protein  NUTRITION DIAGNOSIS:   Inadequate oral intake related to lethargy/confusion, other (see comment) (dementia) as evidenced by NPO status.  GOAL:   Patient will meet greater than or equal to 90% of their needs  MONITOR:   PO intake, Supplement acceptance, Labs, Weight trends, Skin  ASSESSMENT:   83 y.o. female, w dementia , hx of pneumonia apparently was found by family on Friday to be wheezing and congested.  Pt seemed worse on Saturday and therefore went to urgent care at [facility] and sent to ED for evaluation.     Pt had SLP evaluation 4/3 and approved for DYS 1/thin liquid diet for comfort. Pt at risk for aspiration but family wants pt to still be provided food. Per MD note, pt would not want feeding tube. Pt likes ice cream; RD will order Magic Cups with meals. Per chart, pt eating 0% meals. Plan is for pt to discharge back to SNF.   Medications reviewed and include: lovenox, solu-medrol, protonix, Vit D  Labs reviewed: Ca 8.4(L), Alb 3.0(L) Wbc- 10.7(H) cbgs- 156, 173, 138 x 48hrs AIC 5.0 wnl 4/1  Diet Order:  DIET - DYS 1 Room service appropriate? Yes; Fluid consistency: Thin  Skin:  Wound (see comment) (Stage I buttocks and heel)  Last BM:  none since admit  Height:   Ht Readings from Last 1 Encounters:  03/30/17 5' (1.524 m)    Weight:   Wt Readings from Last 1 Encounters:  04/02/17 125 lb 10.6 oz (57 kg)    Ideal Body Weight:  45.4 kg  BMI:  Body mass index is 24.54 kg/m.  Estimated Nutritional Needs:   Kcal:  1400-1600kcal/day   Protein:  73-84g/day   Fluid:  >1.4L/day   EDUCATION NEEDS:   No education needs identified at this time  Koleen Distance, RD, LDN Pager #[phone]

## 2017-04-02 NOTE — NC FL2 (Signed)
[place] LEVEL OF CARE SCREENING TOOL     IDENTIFICATION  Patient Name: Bethany Fox Birthdate: 1917-03-24 Sex: female Admission Date (Current Location): 03/29/2017  [facility] [place] Number:  Herbalist and Address:  [facility],  [place] [address] [place], [place]      Provider Number: [id number]  Attending Physician Name and Address:  Doreatha Lew, MD  Relative Name and Phone Number:       Current Level of Care: Hospital Recommended Level of Care: [place] Prior Approval Number:    Date Approved/Denied:   PASRR Number: [id number]  Discharge Plan: SNF    Current Diagnoses: Patient Active Problem List   Diagnosis Date Noted  . Acute bronchitis due to Rhinovirus 04/01/2017  . Acute respiratory failure with hypoxia ([place]) 04/01/2017  . Iron deficiency anemia 04/01/2017  . Pressure injury of skin 03/31/2017  . Wheezing 03/30/2017  . Anemia 03/30/2017  . Hyperglycemia 03/30/2017  . HCAP (healthcare-associated pneumonia) 03/14/2015  . Sepsis due to pneumonia ([place]) 03/14/2015  . Postoperative anemia due to acute blood loss 07/03/2014  . Acute renal insufficiency 07/03/2014  . Leukocytosis 07/01/2014  . Fall 06/30/2014  . Hip fracture, right ([place]) 06/30/2014  . Right radial head fracture 06/30/2014  . HTN (hypertension) 06/30/2014  . Hip fracture ([place]) 06/30/2014  . Dementia     Orientation RESPIRATION BLADDER Height & Weight     Situation  O2 Incontinent Weight: 125 lb 10.6 oz (57 kg) Height:  5' (152.4 cm)  BEHAVIORAL SYMPTOMS/MOOD NEUROLOGICAL BOWEL NUTRITION STATUS   (uncooperative with some commands due to dementia)   Continent Diet (DYS1)  AMBULATORY STATUS COMMUNICATION OF NEEDS Skin   Extensive Assist Verbally Normal                       Personal Care Assistance Level of Assistance  Bathing, Feeding, Dressing Bathing Assistance: Maximum assistance Feeding assistance: Maximum  assistance Dressing Assistance: Maximum assistance     Functional Limitations Info  Sight, Hearing, Speech Sight Info: Adequate Hearing Info: Adequate Speech Info: Adequate    SPECIAL CARE FACTORS FREQUENCY                       Contractures Contractures Info: Not present    Additional Factors Info  Code Status, Allergies Code Status Info:  (DNR) Allergies Info:  (latex, ciprofloxacin, adhesive tape)           Current Medications (04/02/2017):  This is the current hospital active medication list Current Facility-Administered Medications  Medication Dose Route Frequency Provider Last Rate Last Dose  . enoxaparin (LOVENOX) injection 40 mg  40 mg Subcutaneous Q24H Anh P Pham, RPH   40 mg at 04/01/17 1008  . ipratropium-albuterol (DUONEB) 0.5-2.5 (3) MG/3ML nebulizer solution 3 mL  3 mL Nebulization Q4H PRN Jani Gravel, MD      . ipratropium-albuterol (DUONEB) 0.5-2.5 (3) MG/3ML nebulizer solution 3 mL  3 mL Nebulization Q6H Jani Gravel, MD   3 mL at 04/02/17 0859  . methylPREDNISolone sodium succinate (SOLU-MEDROL) 125 mg/2 mL injection 60 mg  60 mg Intravenous Q12H Omair Latif Sheikh, DO   60 mg at 04/02/17 0559  . ondansetron (ZOFRAN) tablet 4 mg  4 mg Oral Daily PRN Jani Gravel, MD      . pantoprazole (PROTONIX) EC tablet 40 mg  40 mg Oral Daily [facility], DO      . sodium chloride  flush (NS) 0.9 % injection 3 mL  3 mL Intravenous Q12H Jani Gravel, MD   3 mL at 04/01/17 2140  . [START ON 04/05/2017] Vitamin D (Ergocalciferol) (DRISDOL) capsule 50,000 Units  50,000 Units Oral Q7 days Jani Gravel, MD         Discharge Medications: Please see discharge summary for a list of discharge medications.  Relevant Imaging Results:  Relevant Lab Results:   Additional Information  (SS# [id number])  Nila Nephew, [place]

## 2017-04-03 MED ORDER — HALOPERIDOL LACTATE 5 MG/ML IJ SOLN: 1.0000 mg | Freq: Four times a day (QID) | INTRAMUSCULAR | Status: AC | PRN

## 2017-04-03 NOTE — Discharge Summary (Signed)
Physician Discharge Summary  Bethany Fox  INO:[id number]  DOB: February 25, 1917  DOA: 03/29/2017 PCP: Odette Fraction, MD  Admit date: 03/29/2017 Discharge date: 04/03/2017  Admitted From: SNF Disposition:  SNF with hospice services   Recommendations for Outpatient Follow-up:  1. Hospice evaluation, may need comfort measures   Discharge Condition: Hospice  CODE STATUS: DNR  Diet recommendation: Comfort feeds Dysphagia 1 Thing liquids  Brief/Interim Summary: Patient is an 83 year old Caucasian female from SNF with a PMH of Dementia and recurrent PNA's who was found by family to have wheezing and congestion. She worsened and was taken to Urgent Care who recommended that she be evaluated in the ED and was admitted for dyspnea, wheezing, and tachycardia. Patient was found to have a Rhinovirus/Enterovirus and suspected Bronchitis as repeat CXR did not show any focal infiltrate but did show mild bronchitic changes. Because of concern of Aspiration, SLP was consulted for swallow evaluation who state that she is at high risk for aspiration, patient wishes was to have nutrition until end of life. Decision was made to continue oral feedings with adjusted diet, as daughter requested. Patient respiratory status improved but, clinically not getting any better. Patient is not eating, and remains lethargic. No signs of new infections. WBC trended down, patient afebrile. Given this patient condition, palliative care was consulted, whom recommended hospices services. Family agree with plan. Antibiotics where discontinued as respiratory infection was viral, and did not provide any improvement. Patient will be discharge to SNF with hospices follow up. Patient's daughter not interested on [facility] at this time.   Subjective: Patient seen and examined with daughter at bedside. Patient sleeping, per nursing staff patient was restless overnight. Not very responsive to commands.   Discharge Diagnoses/Hospital  Course:  Acute respiratory failure with hypoxia due to bronchitis  PE was ruled out, No signs of PNA, Likely silent aspiration  Acute viral bronchitis due to Rhinovirus/Enterovirus - no significant improvement. Patient initially treated with Rocephin, Azithromycin, Solumedrol and nebulizer therapy - work of breathing improved - but no significant improvement overall  Discussed with daughter prognosis, given poor improvement patient is not likely to do well. Patient is high risk for aspiration and family want to continue feedings as long as she tolerates. Given the aspiration risk was explained that she is going to get more PNA and this will put her in danger. Family understood. I offered a palliative care consult for end of life discussion for which they agree. Subsequently family decided to keep patient comfortable and have hospice services follow.  Abx d/ced 04/02/17 as this is viral etiology, and no improvement despite treatment  Solumedrol d/ced 04/02/17 Can continue nebs treatment as needed O2 as needed   Dementia  High risk for sundowning - added haldol and risperidone qHS  Defer to hospice care for further recommendations - if patient become agitated   HTN - BP stable not on medications as outpatient Continue to monitor   Iron deficiency anemia  Continue to monitor   Goal of care - Emotional support provided to family, patient has poor prognosis. Discussed with family which verbalized understanding.  Discharge Instructions  You were cared for by a hospitalist during your hospital stay. If you have any questions about your discharge medications or the care you received while you were in the hospital after you are discharged, you can call the unit and asked to speak with the hospitalist on call if the hospitalist that took care of you is not available. Once you  are discharged, your primary care physician will handle any further medical issues. Please note that NO REFILLS for any  discharge medications will be authorized once you are discharged, as it is imperative that you return to your primary care physician (or establish a relationship with a primary care physician if you do not have one) for your aftercare needs so that they can reassess your need for medications and monitor your lab values.  Discharge Instructions    Call MD for:  difficulty breathing, headache or visual disturbances    Complete by:  As directed    Call MD for:  extreme fatigue    Complete by:  As directed    Call MD for:  hives    Complete by:  As directed    Call MD for:  persistant dizziness or light-headedness    Complete by:  As directed    Call MD for:  persistant nausea and vomiting    Complete by:  As directed    Call MD for:  redness, tenderness, or signs of infection (pain, swelling, redness, odor or green/yellow discharge around incision site)    Complete by:  As directed    Call MD for:  severe uncontrolled pain    Complete by:  As directed    Call MD for:  temperature >100.4    Complete by:  As directed    Diet - low sodium heart healthy    Complete by:  As directed    Increase activity slowly    Complete by:  As directed      Allergies as of 04/03/2017      Reactions   Adhesive [tape]    Unknown    Latex    rash   Ciprofloxacin Rash      Medication List    STOP taking these medications   amLODipine 5 MG tablet Commonly known as:  NORVASC   amoxicillin-clavulanate 875-125 MG tablet Commonly known as:  AUGMENTIN   HYDROcodone-acetaminophen 5-325 MG tablet Commonly known as:  NORCO/VICODIN   mirtazapine 7.5 MG tablet Commonly known as:  REMERON   polyethylene glycol packet Commonly known as:  MIRALAX / GLYCOLAX   Vitamin D (Ergocalciferol) 50000 units Caps capsule Commonly known as:  DRISDOL     TAKE these medications   acetaminophen 500 MG tablet Commonly known as:  TYLENOL Take 500 mg by mouth every 6 (six) hours as needed for fever (or for pain).    haloperidol lactate 5 MG/ML injection Commonly known as:  HALDOL Inject 0.2 mLs (1 mg total) into the vein every 6 (six) hours as needed.   ipratropium-albuterol 0.5-2.5 (3) MG/3ML Soln Commonly known as:  DUONEB Take 3 mLs by nebulization every 4 (four) hours as needed.   LORazepam 0.5 MG tablet Commonly known as:  ATIVAN Take 0.5 tablets (0.25 mg total) by mouth every 8 (eight) hours as needed for anxiety.   magnesium hydroxide 400 MG/5ML suspension Commonly known as:  MILK OF MAGNESIA Take 30 mLs by mouth daily as needed for mild constipation.   ondansetron 4 MG tablet Commonly known as:  ZOFRAN Take 4 mg by mouth daily as needed for nausea.   risperiDONE 0.25 MG tablet Commonly known as:  RISPERDAL Take 1 tablet (0.25 mg total) by mouth at bedtime.       Allergies  Allergen Reactions  . Adhesive [Tape]     Unknown   . Latex     rash  . Ciprofloxacin Rash    Consultations:  Palliative Care   Procedures/Studies: Dg Chest 2 View  Result Date: 03/29/2017 CLINICAL DATA:  Acute onset of shortness of breath and dyspnea. Initial encounter. EXAM: CHEST  2 VIEW COMPARISON:  Chest radiograph performed 03/17/2015 FINDINGS: The lungs are well-aerated and clear. There is no evidence of focal opacification, pleural effusion or pneumothorax. The heart is borderline normal in size. No acute osseous abnormalities are seen. Clips are seen at the left axilla. Clips are noted within the right upper quadrant, reflecting prior cholecystectomy. IMPRESSION: Previously noted bilateral airspace opacities have largely resolved. No acute cardiopulmonary process seen. Electronically Signed   By: Garald Balding M.D.   On: 03/29/2017 19:14   Ct Angio Chest Pe W Or Wo Contrast  Result Date: 03/30/2017 CLINICAL DATA:  Wheezing, shortness of breath, tachycardia, elevated D-dimer, hypoxia. EXAM: CT ANGIOGRAPHY CHEST WITH CONTRAST TECHNIQUE: Multidetector CT imaging of the chest was performed using  the standard protocol during bolus administration of intravenous contrast. Multiplanar CT image reconstructions and MIPs were obtained to evaluate the vascular anatomy. CONTRAST:  100 cc Isovue 370 COMPARISON:  Chest radiographs obtained yesterday. FINDINGS: Cardiovascular: Normally opacified pulmonary arteries with no pulmonary arterial filling defects seen. Aortic and coronary artery calcifications. Biatrial enlargement. Mediastinum/Nodes: No enlarged lymph nodes. 2 mm left lobe thyroid nodule. Lungs/Pleura: Biapical pleural and parenchymal scarring. Diffuse peribronchial thickening. Minimal right lower lobe atelectasis. No pleural fluid. Upper Abdomen: Mild intrahepatic biliary ductal dilatation, within normal limits for a patient of this age who has had a cholecystectomy. Musculoskeletal: Approximately 25% T12 superior endplate compression deformity with minimal bony retropulsion and mild anterior spur formation. No acute fracture lines. Multilevel degenerative changes. Review of the MIP images confirms the above findings. IMPRESSION: 1. No pulmonary emboli or acute abnormality. 2. Moderate to marked bronchitic changes. 3. 25% old T12 compression fracture. Electronically Signed   By: Claudie Revering M.D.   On: 03/30/2017 09:21   Dg Chest Port 1 View  Result Date: 03/31/2017 CLINICAL DATA:  Wheezing. EXAM: PORTABLE CHEST 1 VIEW COMPARISON:  CT 03/30/2017.  Chest x-ray 03/31/ 2018. FINDINGS: Mediastinum and hilar structures are normal. Heart size normal. Mild bronchitic changes again noted. No focal infiltrate. No pleural effusion or pneumothorax. Surgical clips left axilla. IMPRESSION: Mild bronchitic changes again noted.  No focal infiltrate. Electronically Signed   By: Marcello Moores  Register   On: 03/31/2017 06:49    Discharge Exam: Vitals:   04/02/17 2149 04/03/17 0639  BP: (!) 113/48 133/77  Pulse: 86 70  Resp: 18 16  Temp: 97.7 F (36.5 C) 97.6 F (36.4 C)   Vitals:   04/02/17 1450 04/02/17 2014  04/02/17 2149 04/03/17 0639  BP: 137/78  (!) 113/48 133/77  Pulse: 81  86 70  Resp: 20  18 16   Temp: 97.5 F (36.4 C)  97.7 F (36.5 C) 97.6 F (36.4 C)  TempSrc: Axillary  Axillary Axillary  SpO2: 92% 91% 93% 95%  Weight:    55.3 kg (121 lb 14.6 oz)  Height:        General: Pt is sleepy, not very responsive to commands  Cardiovascular: RRR, S1/S2 +, 2/6 SM , no gallops Respiratory: Decrease air entry b/l with diffuse rhonchi  Abdominal: Soft, NT, ND, bowel sounds + Extremities: no edema.   The results of significant diagnostics from this hospitalization (including imaging, microbiology, ancillary and laboratory) are listed below for reference.     Microbiology: Recent Results (from the past 240 hour(s))  MRSA PCR Screening     Status:  None   Collection Time: 03/30/17  4:36 AM  Result Value Ref Range Status   MRSA by PCR NEGATIVE NEGATIVE Final    Comment:        The GeneXpert MRSA Assay (FDA approved for NASAL specimens only), is one component of a comprehensive MRSA colonization surveillance program. It is not intended to diagnose MRSA infection nor to guide or monitor treatment for MRSA infections.   Respiratory Panel by PCR     Status: Abnormal   Collection Time: 03/30/17 10:47 AM  Result Value Ref Range Status   Adenovirus NOT DETECTED NOT DETECTED Final   Coronavirus 229E NOT DETECTED NOT DETECTED Final   Coronavirus HKU1 NOT DETECTED NOT DETECTED Final   Coronavirus NL63 NOT DETECTED NOT DETECTED Final   Coronavirus OC43 NOT DETECTED NOT DETECTED Final   Metapneumovirus NOT DETECTED NOT DETECTED Final   Rhinovirus / Enterovirus DETECTED (A) NOT DETECTED Final   Influenza A NOT DETECTED NOT DETECTED Final   Influenza B NOT DETECTED NOT DETECTED Final   Parainfluenza Virus 1 NOT DETECTED NOT DETECTED Final   Parainfluenza Virus 2 NOT DETECTED NOT DETECTED Final   Parainfluenza Virus 3 NOT DETECTED NOT DETECTED Final   Parainfluenza Virus 4 NOT DETECTED NOT  DETECTED Final   Respiratory Syncytial Virus NOT DETECTED NOT DETECTED Final   Bordetella pertussis NOT DETECTED NOT DETECTED Final   Chlamydophila pneumoniae NOT DETECTED NOT DETECTED Final   Mycoplasma pneumoniae NOT DETECTED NOT DETECTED Final    Comment: Performed at [facility], [place]. [address] [place], [place] [address]     Labs: Basic Metabolic Panel:  Recent Labs Lab 03/30/17 0026 03/30/17 0521 03/31/17 0528 04/01/17 0522 04/02/17 0532  NA 139 140 142 144 142  K 3.6 3.5 3.6 3.5 3.7  CL 108 108 111 113* 107  CO2 23 24 23 24 25   GLUCOSE 178* 173* 156* 173* 138*  BUN 20 20 20  25* 19  CREATININE 0.76 0.76 0.68 0.77 0.70  CALCIUM 8.6* 8.5* 8.5* 8.6* 8.4*  MG  --   --  2.0 2.1 2.0  PHOS  --   --  3.0 2.8 2.8   Liver Function Tests:  Recent Labs Lab 03/30/17 0521 03/31/17 0528 04/01/17 0522 04/02/17 0532  AST 20 21 22 26   ALT 12* 12* 14 15  ALKPHOS 62 59 59 58  BILITOT 0.9 0.5 0.3 0.4  PROT 6.9 6.8 6.6 6.5  ALBUMIN 2.9* 3.1* 3.0* 3.0*   CBC:  Recent Labs Lab 03/30/17 0026 03/30/17 0521 03/31/17 0528 04/01/17 0522 04/02/17 0532  WBC 8.9 7.2 9.5 11.6* 10.7*  NEUTROABS 8.3*  --  8.7* 10.8* 9.3*  HGB 10.2* 10.3* 10.0* 9.9* 10.3*  HCT 31.0* 31.0*  30.6* 30.9* 30.7* 31.5*  MCV 88.3 88.3 86.1 89.0 89.0  PLT 143* 145* 136* 173 140*   Cardiac Enzymes:  Recent Labs Lab 03/30/17 0245 03/30/17 0835 03/30/17 1449  TROPONINI <0.03 <0.03 <0.03    Urinalysis    Component Value Date/Time   COLORURINE YELLOW 07/01/2014 1025   APPEARANCEUR CLEAR 07/01/2014 1025   LABSPEC 1.016 07/01/2014 1025   PHURINE 6.5 07/01/2014 1025   GLUCOSEU NEGATIVE 07/01/2014 1025   HGBUR NEGATIVE 07/01/2014 1025   BILIRUBINUR NEGATIVE 07/01/2014 1025   BILIRUBINUR neg 01/06/2014 1542   KETONESUR NEGATIVE 07/01/2014 1025   PROTEINUR NEGATIVE 07/01/2014 1025   UROBILINOGEN 1.0 07/01/2014 1025   NITRITE NEGATIVE 07/01/2014 1025   LEUKOCYTESUR NEGATIVE 07/01/2014 1025    Microbiology Recent Results (  from the past 240 hour(s))  MRSA PCR Screening     Status: None   Collection Time: 03/30/17  4:36 AM  Result Value Ref Range Status   MRSA by PCR NEGATIVE NEGATIVE Final    Comment:        The GeneXpert MRSA Assay (FDA approved for NASAL specimens only), is one component of a comprehensive MRSA colonization surveillance program. It is not intended to diagnose MRSA infection nor to guide or monitor treatment for MRSA infections.   Respiratory Panel by PCR     Status: Abnormal   Collection Time: 03/30/17 10:47 AM  Result Value Ref Range Status   Adenovirus NOT DETECTED NOT DETECTED Final   Coronavirus 229E NOT DETECTED NOT DETECTED Final   Coronavirus HKU1 NOT DETECTED NOT DETECTED Final   Coronavirus NL63 NOT DETECTED NOT DETECTED Final   Coronavirus OC43 NOT DETECTED NOT DETECTED Final   Metapneumovirus NOT DETECTED NOT DETECTED Final   Rhinovirus / Enterovirus DETECTED (A) NOT DETECTED Final   Influenza A NOT DETECTED NOT DETECTED Final   Influenza B NOT DETECTED NOT DETECTED Final   Parainfluenza Virus 1 NOT DETECTED NOT DETECTED Final   Parainfluenza Virus 2 NOT DETECTED NOT DETECTED Final   Parainfluenza Virus 3 NOT DETECTED NOT DETECTED Final   Parainfluenza Virus 4 NOT DETECTED NOT DETECTED Final   Respiratory Syncytial Virus NOT DETECTED NOT DETECTED Final   Bordetella pertussis NOT DETECTED NOT DETECTED Final   Chlamydophila pneumoniae NOT DETECTED NOT DETECTED Final   Mycoplasma pneumoniae NOT DETECTED NOT DETECTED Final    Comment: Performed at [facility], [place]. [address] [place], [place] [address]    Time coordinating discharge: 42 minutes  SIGNED:  Chipper Oman, MD  [facility] 04/03/2017, 1:07 PM  Pager please text page via  [email or web address] Password [email or web address]

## 2017-04-03 NOTE — Progress Notes (Signed)
Pt is ready to return to [facility] today. Daughter is aware and in agreement with d/c plan. [facility] transport is required. Medical necessity form completed. D/C Summary sent to SNF for review. No scripts printed. # for report provided to nsg.  Werner Lean LCSW [phone]

## 2017-04-03 NOTE — Care Management Important Message (Signed)
Important Message  Patient Details  Name: ROSETTA RUPNOW MRN: [id number] Date of Birth: Sep 27, 1917   Medicare Important Message Given:  Yes    Kerin Salen 04/03/2017, 10:40 AMImportant Message  Patient Details  Name: TANICKA BISAILLON MRN: [id number] Date of Birth: 07-09-1917   Medicare Important Message Given:  Yes    Kerin Salen 04/03/2017, 10:40 AM

## 2017-04-03 NOTE — Progress Notes (Signed)
Gave report to [place] at [facility] facility. SRP, RN

## 2017-04-03 NOTE — Progress Notes (Signed)
Report called to [facility] nusing facility, [facility] called per Education officer, museum, family at bedside waiting with pt.

## 2017-04-04 DIAGNOSIS — I10 Essential (primary) hypertension: Secondary | ICD-10-CM | POA: Diagnosis not present

## 2017-04-04 DIAGNOSIS — E559 Vitamin D deficiency, unspecified: Secondary | ICD-10-CM | POA: Diagnosis not present

## 2017-04-04 DIAGNOSIS — Z79899 Other long term (current) drug therapy: Secondary | ICD-10-CM | POA: Diagnosis not present

## 2017-04-04 DIAGNOSIS — E039 Hypothyroidism, unspecified: Secondary | ICD-10-CM | POA: Diagnosis not present

## 2017-04-04 DIAGNOSIS — D649 Anemia, unspecified: Secondary | ICD-10-CM | POA: Diagnosis not present

## 2017-04-04 DIAGNOSIS — E785 Hyperlipidemia, unspecified: Secondary | ICD-10-CM | POA: Diagnosis not present

## 2017-04-07 DIAGNOSIS — F0391 Unspecified dementia with behavioral disturbance: Secondary | ICD-10-CM | POA: Diagnosis not present

## 2017-04-07 DIAGNOSIS — J9601 Acute respiratory failure with hypoxia: Secondary | ICD-10-CM | POA: Diagnosis not present

## 2017-04-07 DIAGNOSIS — J208 Acute bronchitis due to other specified organisms: Secondary | ICD-10-CM | POA: Diagnosis not present

## 2017-04-07 DIAGNOSIS — I10 Essential (primary) hypertension: Secondary | ICD-10-CM | POA: Diagnosis not present

## 2017-04-11 DIAGNOSIS — E46 Unspecified protein-calorie malnutrition: Secondary | ICD-10-CM | POA: Diagnosis not present

## 2017-04-11 DIAGNOSIS — I10 Essential (primary) hypertension: Secondary | ICD-10-CM | POA: Diagnosis not present

## 2017-04-11 DIAGNOSIS — D649 Anemia, unspecified: Secondary | ICD-10-CM | POA: Diagnosis not present

## 2017-04-11 DIAGNOSIS — F322 Major depressive disorder, single episode, severe without psychotic features: Secondary | ICD-10-CM | POA: Diagnosis not present

## 2017-04-11 DIAGNOSIS — F0391 Unspecified dementia with behavioral disturbance: Secondary | ICD-10-CM | POA: Diagnosis not present

## 2017-04-23 DIAGNOSIS — I10 Essential (primary) hypertension: Secondary | ICD-10-CM | POA: Diagnosis not present

## 2017-04-23 DIAGNOSIS — F341 Dysthymic disorder: Secondary | ICD-10-CM | POA: Diagnosis not present

## 2017-04-23 DIAGNOSIS — F0391 Unspecified dementia with behavioral disturbance: Secondary | ICD-10-CM | POA: Diagnosis not present

## 2017-04-23 DIAGNOSIS — J208 Acute bronchitis due to other specified organisms: Secondary | ICD-10-CM | POA: Diagnosis not present

## 2017-05-03 DIAGNOSIS — D649 Anemia, unspecified: Secondary | ICD-10-CM | POA: Diagnosis not present

## 2017-05-03 DIAGNOSIS — Z79899 Other long term (current) drug therapy: Secondary | ICD-10-CM | POA: Diagnosis not present

## 2017-05-03 DIAGNOSIS — I10 Essential (primary) hypertension: Secondary | ICD-10-CM | POA: Diagnosis not present

## 2017-05-03 DIAGNOSIS — E785 Hyperlipidemia, unspecified: Secondary | ICD-10-CM | POA: Diagnosis not present

## 2017-05-09 DIAGNOSIS — E46 Unspecified protein-calorie malnutrition: Secondary | ICD-10-CM | POA: Diagnosis not present

## 2017-05-09 DIAGNOSIS — I10 Essential (primary) hypertension: Secondary | ICD-10-CM | POA: Diagnosis not present

## 2017-05-09 DIAGNOSIS — F322 Major depressive disorder, single episode, severe without psychotic features: Secondary | ICD-10-CM | POA: Diagnosis not present

## 2017-05-09 DIAGNOSIS — F0391 Unspecified dementia with behavioral disturbance: Secondary | ICD-10-CM | POA: Diagnosis not present

## 2017-05-09 DIAGNOSIS — D649 Anemia, unspecified: Secondary | ICD-10-CM | POA: Diagnosis not present

## 2017-06-05 DIAGNOSIS — F0390 Unspecified dementia without behavioral disturbance: Secondary | ICD-10-CM | POA: Diagnosis not present

## 2017-06-05 DIAGNOSIS — R278 Other lack of coordination: Secondary | ICD-10-CM | POA: Diagnosis not present

## 2017-06-05 DIAGNOSIS — J189 Pneumonia, unspecified organism: Secondary | ICD-10-CM | POA: Diagnosis not present

## 2017-06-05 DIAGNOSIS — Z9181 History of falling: Secondary | ICD-10-CM | POA: Diagnosis not present

## 2017-06-05 DIAGNOSIS — M6281 Muscle weakness (generalized): Secondary | ICD-10-CM | POA: Diagnosis not present

## 2017-06-05 DIAGNOSIS — R41841 Cognitive communication deficit: Secondary | ICD-10-CM | POA: Diagnosis not present

## 2017-06-05 DIAGNOSIS — R627 Adult failure to thrive: Secondary | ICD-10-CM | POA: Diagnosis not present

## 2017-06-05 DIAGNOSIS — I10 Essential (primary) hypertension: Secondary | ICD-10-CM | POA: Diagnosis not present

## 2017-06-06 DIAGNOSIS — R627 Adult failure to thrive: Secondary | ICD-10-CM | POA: Diagnosis not present

## 2017-06-06 DIAGNOSIS — R278 Other lack of coordination: Secondary | ICD-10-CM | POA: Diagnosis not present

## 2017-06-06 DIAGNOSIS — M6281 Muscle weakness (generalized): Secondary | ICD-10-CM | POA: Diagnosis not present

## 2017-06-06 DIAGNOSIS — Z9181 History of falling: Secondary | ICD-10-CM | POA: Diagnosis not present

## 2017-06-06 DIAGNOSIS — I10 Essential (primary) hypertension: Secondary | ICD-10-CM | POA: Diagnosis not present

## 2017-06-06 DIAGNOSIS — F0390 Unspecified dementia without behavioral disturbance: Secondary | ICD-10-CM | POA: Diagnosis not present

## 2017-06-07 DIAGNOSIS — R05 Cough: Secondary | ICD-10-CM | POA: Diagnosis not present

## 2017-06-09 DIAGNOSIS — F0390 Unspecified dementia without behavioral disturbance: Secondary | ICD-10-CM | POA: Diagnosis not present

## 2017-06-09 DIAGNOSIS — R278 Other lack of coordination: Secondary | ICD-10-CM | POA: Diagnosis not present

## 2017-06-09 DIAGNOSIS — Z9181 History of falling: Secondary | ICD-10-CM | POA: Diagnosis not present

## 2017-06-09 DIAGNOSIS — R627 Adult failure to thrive: Secondary | ICD-10-CM | POA: Diagnosis not present

## 2017-06-09 DIAGNOSIS — M6281 Muscle weakness (generalized): Secondary | ICD-10-CM | POA: Diagnosis not present

## 2017-06-09 DIAGNOSIS — I10 Essential (primary) hypertension: Secondary | ICD-10-CM | POA: Diagnosis not present

## 2017-06-10 DIAGNOSIS — Z9181 History of falling: Secondary | ICD-10-CM | POA: Diagnosis not present

## 2017-06-10 DIAGNOSIS — I10 Essential (primary) hypertension: Secondary | ICD-10-CM | POA: Diagnosis not present

## 2017-06-10 DIAGNOSIS — M6281 Muscle weakness (generalized): Secondary | ICD-10-CM | POA: Diagnosis not present

## 2017-06-10 DIAGNOSIS — R627 Adult failure to thrive: Secondary | ICD-10-CM | POA: Diagnosis not present

## 2017-06-10 DIAGNOSIS — F0390 Unspecified dementia without behavioral disturbance: Secondary | ICD-10-CM | POA: Diagnosis not present

## 2017-06-10 DIAGNOSIS — R278 Other lack of coordination: Secondary | ICD-10-CM | POA: Diagnosis not present

## 2017-06-11 DIAGNOSIS — F0390 Unspecified dementia without behavioral disturbance: Secondary | ICD-10-CM | POA: Diagnosis not present

## 2017-06-11 DIAGNOSIS — M6281 Muscle weakness (generalized): Secondary | ICD-10-CM | POA: Diagnosis not present

## 2017-06-11 DIAGNOSIS — Z9181 History of falling: Secondary | ICD-10-CM | POA: Diagnosis not present

## 2017-06-11 DIAGNOSIS — I10 Essential (primary) hypertension: Secondary | ICD-10-CM | POA: Diagnosis not present

## 2017-06-11 DIAGNOSIS — R278 Other lack of coordination: Secondary | ICD-10-CM | POA: Diagnosis not present

## 2017-06-11 DIAGNOSIS — R627 Adult failure to thrive: Secondary | ICD-10-CM | POA: Diagnosis not present

## 2017-06-12 DIAGNOSIS — Z9181 History of falling: Secondary | ICD-10-CM | POA: Diagnosis not present

## 2017-06-12 DIAGNOSIS — R627 Adult failure to thrive: Secondary | ICD-10-CM | POA: Diagnosis not present

## 2017-06-12 DIAGNOSIS — R278 Other lack of coordination: Secondary | ICD-10-CM | POA: Diagnosis not present

## 2017-06-12 DIAGNOSIS — F0390 Unspecified dementia without behavioral disturbance: Secondary | ICD-10-CM | POA: Diagnosis not present

## 2017-06-12 DIAGNOSIS — M6281 Muscle weakness (generalized): Secondary | ICD-10-CM | POA: Diagnosis not present

## 2017-06-12 DIAGNOSIS — I10 Essential (primary) hypertension: Secondary | ICD-10-CM | POA: Diagnosis not present

## 2017-06-13 DIAGNOSIS — Z9181 History of falling: Secondary | ICD-10-CM | POA: Diagnosis not present

## 2017-06-13 DIAGNOSIS — M6281 Muscle weakness (generalized): Secondary | ICD-10-CM | POA: Diagnosis not present

## 2017-06-13 DIAGNOSIS — R627 Adult failure to thrive: Secondary | ICD-10-CM | POA: Diagnosis not present

## 2017-06-13 DIAGNOSIS — I10 Essential (primary) hypertension: Secondary | ICD-10-CM | POA: Diagnosis not present

## 2017-06-13 DIAGNOSIS — R278 Other lack of coordination: Secondary | ICD-10-CM | POA: Diagnosis not present

## 2017-06-13 DIAGNOSIS — F0390 Unspecified dementia without behavioral disturbance: Secondary | ICD-10-CM | POA: Diagnosis not present

## 2017-06-16 DIAGNOSIS — R627 Adult failure to thrive: Secondary | ICD-10-CM | POA: Diagnosis not present

## 2017-06-16 DIAGNOSIS — F0390 Unspecified dementia without behavioral disturbance: Secondary | ICD-10-CM | POA: Diagnosis not present

## 2017-06-16 DIAGNOSIS — I10 Essential (primary) hypertension: Secondary | ICD-10-CM | POA: Diagnosis not present

## 2017-06-16 DIAGNOSIS — R278 Other lack of coordination: Secondary | ICD-10-CM | POA: Diagnosis not present

## 2017-06-16 DIAGNOSIS — Z9181 History of falling: Secondary | ICD-10-CM | POA: Diagnosis not present

## 2017-06-16 DIAGNOSIS — M6281 Muscle weakness (generalized): Secondary | ICD-10-CM | POA: Diagnosis not present

## 2017-06-17 DIAGNOSIS — I10 Essential (primary) hypertension: Secondary | ICD-10-CM | POA: Diagnosis not present

## 2017-06-17 DIAGNOSIS — R627 Adult failure to thrive: Secondary | ICD-10-CM | POA: Diagnosis not present

## 2017-06-17 DIAGNOSIS — M6281 Muscle weakness (generalized): Secondary | ICD-10-CM | POA: Diagnosis not present

## 2017-06-17 DIAGNOSIS — F0390 Unspecified dementia without behavioral disturbance: Secondary | ICD-10-CM | POA: Diagnosis not present

## 2017-06-17 DIAGNOSIS — R278 Other lack of coordination: Secondary | ICD-10-CM | POA: Diagnosis not present

## 2017-06-17 DIAGNOSIS — Z9181 History of falling: Secondary | ICD-10-CM | POA: Diagnosis not present

## 2017-06-18 DIAGNOSIS — R278 Other lack of coordination: Secondary | ICD-10-CM | POA: Diagnosis not present

## 2017-06-18 DIAGNOSIS — R627 Adult failure to thrive: Secondary | ICD-10-CM | POA: Diagnosis not present

## 2017-06-18 DIAGNOSIS — M6281 Muscle weakness (generalized): Secondary | ICD-10-CM | POA: Diagnosis not present

## 2017-06-18 DIAGNOSIS — Z9181 History of falling: Secondary | ICD-10-CM | POA: Diagnosis not present

## 2017-06-18 DIAGNOSIS — I10 Essential (primary) hypertension: Secondary | ICD-10-CM | POA: Diagnosis not present

## 2017-06-18 DIAGNOSIS — F0390 Unspecified dementia without behavioral disturbance: Secondary | ICD-10-CM | POA: Diagnosis not present

## 2017-06-19 DIAGNOSIS — I10 Essential (primary) hypertension: Secondary | ICD-10-CM | POA: Diagnosis not present

## 2017-06-19 DIAGNOSIS — R278 Other lack of coordination: Secondary | ICD-10-CM | POA: Diagnosis not present

## 2017-06-19 DIAGNOSIS — Z9181 History of falling: Secondary | ICD-10-CM | POA: Diagnosis not present

## 2017-06-19 DIAGNOSIS — R627 Adult failure to thrive: Secondary | ICD-10-CM | POA: Diagnosis not present

## 2017-06-19 DIAGNOSIS — F0390 Unspecified dementia without behavioral disturbance: Secondary | ICD-10-CM | POA: Diagnosis not present

## 2017-06-19 DIAGNOSIS — M6281 Muscle weakness (generalized): Secondary | ICD-10-CM | POA: Diagnosis not present

## 2017-06-20 DIAGNOSIS — R278 Other lack of coordination: Secondary | ICD-10-CM | POA: Diagnosis not present

## 2017-06-20 DIAGNOSIS — I10 Essential (primary) hypertension: Secondary | ICD-10-CM | POA: Diagnosis not present

## 2017-06-20 DIAGNOSIS — M6281 Muscle weakness (generalized): Secondary | ICD-10-CM | POA: Diagnosis not present

## 2017-06-20 DIAGNOSIS — F0390 Unspecified dementia without behavioral disturbance: Secondary | ICD-10-CM | POA: Diagnosis not present

## 2017-06-20 DIAGNOSIS — R627 Adult failure to thrive: Secondary | ICD-10-CM | POA: Diagnosis not present

## 2017-06-20 DIAGNOSIS — Z9181 History of falling: Secondary | ICD-10-CM | POA: Diagnosis not present

## 2017-06-23 DIAGNOSIS — F0390 Unspecified dementia without behavioral disturbance: Secondary | ICD-10-CM | POA: Diagnosis not present

## 2017-06-23 DIAGNOSIS — R627 Adult failure to thrive: Secondary | ICD-10-CM | POA: Diagnosis not present

## 2017-06-23 DIAGNOSIS — Z9181 History of falling: Secondary | ICD-10-CM | POA: Diagnosis not present

## 2017-06-23 DIAGNOSIS — R278 Other lack of coordination: Secondary | ICD-10-CM | POA: Diagnosis not present

## 2017-06-23 DIAGNOSIS — I10 Essential (primary) hypertension: Secondary | ICD-10-CM | POA: Diagnosis not present

## 2017-06-23 DIAGNOSIS — M6281 Muscle weakness (generalized): Secondary | ICD-10-CM | POA: Diagnosis not present

## 2017-06-24 DIAGNOSIS — F0390 Unspecified dementia without behavioral disturbance: Secondary | ICD-10-CM | POA: Diagnosis not present

## 2017-06-24 DIAGNOSIS — M6281 Muscle weakness (generalized): Secondary | ICD-10-CM | POA: Diagnosis not present

## 2017-06-24 DIAGNOSIS — R278 Other lack of coordination: Secondary | ICD-10-CM | POA: Diagnosis not present

## 2017-06-24 DIAGNOSIS — R627 Adult failure to thrive: Secondary | ICD-10-CM | POA: Diagnosis not present

## 2017-06-24 DIAGNOSIS — Z9181 History of falling: Secondary | ICD-10-CM | POA: Diagnosis not present

## 2017-06-24 DIAGNOSIS — I10 Essential (primary) hypertension: Secondary | ICD-10-CM | POA: Diagnosis not present

## 2017-06-26 DIAGNOSIS — R627 Adult failure to thrive: Secondary | ICD-10-CM | POA: Diagnosis not present

## 2017-06-26 DIAGNOSIS — R278 Other lack of coordination: Secondary | ICD-10-CM | POA: Diagnosis not present

## 2017-06-26 DIAGNOSIS — F0390 Unspecified dementia without behavioral disturbance: Secondary | ICD-10-CM | POA: Diagnosis not present

## 2017-06-26 DIAGNOSIS — I10 Essential (primary) hypertension: Secondary | ICD-10-CM | POA: Diagnosis not present

## 2017-06-26 DIAGNOSIS — M6281 Muscle weakness (generalized): Secondary | ICD-10-CM | POA: Diagnosis not present

## 2017-06-26 DIAGNOSIS — Z9181 History of falling: Secondary | ICD-10-CM | POA: Diagnosis not present

## 2017-06-27 DIAGNOSIS — I10 Essential (primary) hypertension: Secondary | ICD-10-CM | POA: Diagnosis not present

## 2017-06-27 DIAGNOSIS — R627 Adult failure to thrive: Secondary | ICD-10-CM | POA: Diagnosis not present

## 2017-06-27 DIAGNOSIS — R278 Other lack of coordination: Secondary | ICD-10-CM | POA: Diagnosis not present

## 2017-06-27 DIAGNOSIS — M6281 Muscle weakness (generalized): Secondary | ICD-10-CM | POA: Diagnosis not present

## 2017-06-27 DIAGNOSIS — Z9181 History of falling: Secondary | ICD-10-CM | POA: Diagnosis not present

## 2017-06-27 DIAGNOSIS — F0390 Unspecified dementia without behavioral disturbance: Secondary | ICD-10-CM | POA: Diagnosis not present

## 2017-06-28 DIAGNOSIS — M6281 Muscle weakness (generalized): Secondary | ICD-10-CM | POA: Diagnosis not present

## 2017-06-28 DIAGNOSIS — F0390 Unspecified dementia without behavioral disturbance: Secondary | ICD-10-CM | POA: Diagnosis not present

## 2017-06-28 DIAGNOSIS — R627 Adult failure to thrive: Secondary | ICD-10-CM | POA: Diagnosis not present

## 2017-06-28 DIAGNOSIS — Z9181 History of falling: Secondary | ICD-10-CM | POA: Diagnosis not present

## 2017-06-28 DIAGNOSIS — R278 Other lack of coordination: Secondary | ICD-10-CM | POA: Diagnosis not present

## 2017-06-28 DIAGNOSIS — I10 Essential (primary) hypertension: Secondary | ICD-10-CM | POA: Diagnosis not present

## 2017-06-30 DIAGNOSIS — Z9181 History of falling: Secondary | ICD-10-CM | POA: Diagnosis not present

## 2017-06-30 DIAGNOSIS — F0390 Unspecified dementia without behavioral disturbance: Secondary | ICD-10-CM | POA: Diagnosis not present

## 2017-06-30 DIAGNOSIS — R41841 Cognitive communication deficit: Secondary | ICD-10-CM | POA: Diagnosis not present

## 2017-06-30 DIAGNOSIS — J189 Pneumonia, unspecified organism: Secondary | ICD-10-CM | POA: Diagnosis not present

## 2017-06-30 DIAGNOSIS — M6281 Muscle weakness (generalized): Secondary | ICD-10-CM | POA: Diagnosis not present

## 2017-06-30 DIAGNOSIS — R627 Adult failure to thrive: Secondary | ICD-10-CM | POA: Diagnosis not present

## 2017-06-30 DIAGNOSIS — R278 Other lack of coordination: Secondary | ICD-10-CM | POA: Diagnosis not present

## 2017-06-30 DIAGNOSIS — I10 Essential (primary) hypertension: Secondary | ICD-10-CM | POA: Diagnosis not present

## 2017-07-04 DIAGNOSIS — E46 Unspecified protein-calorie malnutrition: Secondary | ICD-10-CM | POA: Diagnosis not present

## 2017-07-04 DIAGNOSIS — D649 Anemia, unspecified: Secondary | ICD-10-CM | POA: Diagnosis not present

## 2017-07-04 DIAGNOSIS — I10 Essential (primary) hypertension: Secondary | ICD-10-CM | POA: Diagnosis not present

## 2017-07-04 DIAGNOSIS — L859 Epidermal thickening, unspecified: Secondary | ICD-10-CM | POA: Diagnosis not present

## 2017-07-04 DIAGNOSIS — F0391 Unspecified dementia with behavioral disturbance: Secondary | ICD-10-CM | POA: Diagnosis not present

## 2017-07-04 DIAGNOSIS — F322 Major depressive disorder, single episode, severe without psychotic features: Secondary | ICD-10-CM | POA: Diagnosis not present

## 2017-07-22 DIAGNOSIS — M79642 Pain in left hand: Secondary | ICD-10-CM | POA: Diagnosis not present

## 2017-07-24 DIAGNOSIS — I10 Essential (primary) hypertension: Secondary | ICD-10-CM | POA: Diagnosis not present

## 2017-07-24 DIAGNOSIS — F0391 Unspecified dementia with behavioral disturbance: Secondary | ICD-10-CM | POA: Diagnosis not present

## 2017-07-24 DIAGNOSIS — F341 Dysthymic disorder: Secondary | ICD-10-CM | POA: Diagnosis not present

## 2017-07-24 DIAGNOSIS — S6982XD Other specified injuries of left wrist, hand and finger(s), subsequent encounter: Secondary | ICD-10-CM | POA: Diagnosis not present

## 2017-08-18 DIAGNOSIS — R05 Cough: Secondary | ICD-10-CM | POA: Diagnosis not present

## 2017-08-18 DIAGNOSIS — F341 Dysthymic disorder: Secondary | ICD-10-CM | POA: Diagnosis not present

## 2017-08-18 DIAGNOSIS — F0391 Unspecified dementia with behavioral disturbance: Secondary | ICD-10-CM | POA: Diagnosis not present

## 2017-08-18 DIAGNOSIS — I10 Essential (primary) hypertension: Secondary | ICD-10-CM | POA: Diagnosis not present

## 2017-09-05 DIAGNOSIS — F322 Major depressive disorder, single episode, severe without psychotic features: Secondary | ICD-10-CM | POA: Diagnosis not present

## 2017-09-05 DIAGNOSIS — I10 Essential (primary) hypertension: Secondary | ICD-10-CM | POA: Diagnosis not present

## 2017-09-05 DIAGNOSIS — C50919 Malignant neoplasm of unspecified site of unspecified female breast: Secondary | ICD-10-CM | POA: Diagnosis not present

## 2017-09-05 DIAGNOSIS — E46 Unspecified protein-calorie malnutrition: Secondary | ICD-10-CM | POA: Diagnosis not present

## 2017-09-05 DIAGNOSIS — R2689 Other abnormalities of gait and mobility: Secondary | ICD-10-CM | POA: Diagnosis not present

## 2017-09-05 DIAGNOSIS — D649 Anemia, unspecified: Secondary | ICD-10-CM | POA: Diagnosis not present

## 2017-09-05 DIAGNOSIS — F0391 Unspecified dementia with behavioral disturbance: Secondary | ICD-10-CM | POA: Diagnosis not present

## 2017-11-07 DIAGNOSIS — D649 Anemia, unspecified: Secondary | ICD-10-CM | POA: Diagnosis not present

## 2017-11-07 DIAGNOSIS — E46 Unspecified protein-calorie malnutrition: Secondary | ICD-10-CM | POA: Diagnosis not present

## 2017-11-07 DIAGNOSIS — R2689 Other abnormalities of gait and mobility: Secondary | ICD-10-CM | POA: Diagnosis not present

## 2017-11-07 DIAGNOSIS — I10 Essential (primary) hypertension: Secondary | ICD-10-CM | POA: Diagnosis not present

## 2017-11-07 DIAGNOSIS — F0391 Unspecified dementia with behavioral disturbance: Secondary | ICD-10-CM | POA: Diagnosis not present

## 2017-11-07 DIAGNOSIS — C50919 Malignant neoplasm of unspecified site of unspecified female breast: Secondary | ICD-10-CM | POA: Diagnosis not present

## 2017-11-07 DIAGNOSIS — F29 Unspecified psychosis not due to a substance or known physiological condition: Secondary | ICD-10-CM | POA: Diagnosis not present

## 2017-11-07 DIAGNOSIS — F322 Major depressive disorder, single episode, severe without psychotic features: Secondary | ICD-10-CM | POA: Diagnosis not present

## 2018-01-02 DIAGNOSIS — F322 Major depressive disorder, single episode, severe without psychotic features: Secondary | ICD-10-CM | POA: Diagnosis not present

## 2018-01-02 DIAGNOSIS — D649 Anemia, unspecified: Secondary | ICD-10-CM | POA: Diagnosis not present

## 2018-01-02 DIAGNOSIS — I10 Essential (primary) hypertension: Secondary | ICD-10-CM | POA: Diagnosis not present

## 2018-01-02 DIAGNOSIS — E46 Unspecified protein-calorie malnutrition: Secondary | ICD-10-CM | POA: Diagnosis not present

## 2018-01-02 DIAGNOSIS — F0391 Unspecified dementia with behavioral disturbance: Secondary | ICD-10-CM | POA: Diagnosis not present

## 2018-01-10 DIAGNOSIS — J811 Chronic pulmonary edema: Secondary | ICD-10-CM | POA: Diagnosis not present

## 2018-01-10 DIAGNOSIS — R05 Cough: Secondary | ICD-10-CM | POA: Diagnosis not present

## 2018-01-14 DIAGNOSIS — F0391 Unspecified dementia with behavioral disturbance: Secondary | ICD-10-CM | POA: Diagnosis not present

## 2018-01-14 DIAGNOSIS — I10 Essential (primary) hypertension: Secondary | ICD-10-CM | POA: Diagnosis not present

## 2018-01-14 DIAGNOSIS — J180 Bronchopneumonia, unspecified organism: Secondary | ICD-10-CM | POA: Diagnosis not present

## 2018-01-14 DIAGNOSIS — F341 Dysthymic disorder: Secondary | ICD-10-CM | POA: Diagnosis not present

## 2018-01-23 DIAGNOSIS — L600 Ingrowing nail: Secondary | ICD-10-CM | POA: Diagnosis not present

## 2018-01-30 DIAGNOSIS — R05 Cough: Secondary | ICD-10-CM | POA: Diagnosis not present

## 2018-01-30 DIAGNOSIS — F0390 Unspecified dementia without behavioral disturbance: Secondary | ICD-10-CM | POA: Diagnosis not present

## 2018-01-30 DIAGNOSIS — J209 Acute bronchitis, unspecified: Secondary | ICD-10-CM | POA: Diagnosis not present

## 2018-02-27 DIAGNOSIS — F322 Major depressive disorder, single episode, severe without psychotic features: Secondary | ICD-10-CM | POA: Diagnosis not present

## 2018-02-27 DIAGNOSIS — D649 Anemia, unspecified: Secondary | ICD-10-CM | POA: Diagnosis not present

## 2018-02-27 DIAGNOSIS — F0391 Unspecified dementia with behavioral disturbance: Secondary | ICD-10-CM | POA: Diagnosis not present

## 2018-02-27 DIAGNOSIS — I10 Essential (primary) hypertension: Secondary | ICD-10-CM | POA: Diagnosis not present

## 2018-02-27 DIAGNOSIS — M1990 Unspecified osteoarthritis, unspecified site: Secondary | ICD-10-CM | POA: Diagnosis not present

## 2018-02-27 DIAGNOSIS — E46 Unspecified protein-calorie malnutrition: Secondary | ICD-10-CM | POA: Diagnosis not present

## 2018-03-04 DIAGNOSIS — I10 Essential (primary) hypertension: Secondary | ICD-10-CM | POA: Diagnosis not present

## 2018-03-04 DIAGNOSIS — F0391 Unspecified dementia with behavioral disturbance: Secondary | ICD-10-CM | POA: Diagnosis not present

## 2018-03-04 DIAGNOSIS — S40022D Contusion of left upper arm, subsequent encounter: Secondary | ICD-10-CM | POA: Diagnosis not present

## 2018-03-04 DIAGNOSIS — F341 Dysthymic disorder: Secondary | ICD-10-CM | POA: Diagnosis not present

## 2018-04-01 ENCOUNTER — Emergency Department (HOSPITAL_COMMUNITY)
Admission: EM | Admit: 2018-04-01 | Discharge: 2018-04-02 | Disposition: A | Discharge status: Home / Self Care | Payer: Medicare Other | Attending: Emergency Medicine | Admitting: Emergency Medicine

## 2018-04-01 ENCOUNTER — Emergency Department (HOSPITAL_COMMUNITY): Payer: Medicare Other

## 2018-04-01 DIAGNOSIS — Z853 Personal history of malignant neoplasm of breast: Secondary | ICD-10-CM | POA: Diagnosis not present

## 2018-04-01 DIAGNOSIS — Y92129 Unspecified place in nursing home as the place of occurrence of the external cause: Secondary | ICD-10-CM | POA: Diagnosis not present

## 2018-04-01 DIAGNOSIS — I10 Essential (primary) hypertension: Secondary | ICD-10-CM | POA: Insufficient documentation

## 2018-04-01 DIAGNOSIS — F0390 Unspecified dementia without behavioral disturbance: Secondary | ICD-10-CM | POA: Diagnosis not present

## 2018-04-01 DIAGNOSIS — S0101XA Laceration without foreign body of scalp, initial encounter: Secondary | ICD-10-CM | POA: Insufficient documentation

## 2018-04-01 DIAGNOSIS — S098XXA Other specified injuries of head, initial encounter: Secondary | ICD-10-CM | POA: Diagnosis present

## 2018-04-01 DIAGNOSIS — Y939 Activity, unspecified: Secondary | ICD-10-CM | POA: Diagnosis not present

## 2018-04-01 DIAGNOSIS — W19XXXA Unspecified fall, initial encounter: Secondary | ICD-10-CM

## 2018-04-01 DIAGNOSIS — S0191XA Laceration without foreign body of unspecified part of head, initial encounter: Secondary | ICD-10-CM | POA: Diagnosis not present

## 2018-04-01 DIAGNOSIS — S0990XA Unspecified injury of head, initial encounter: Secondary | ICD-10-CM

## 2018-04-01 DIAGNOSIS — S199XXA Unspecified injury of neck, initial encounter: Secondary | ICD-10-CM | POA: Diagnosis not present

## 2018-04-01 DIAGNOSIS — W1830XA Fall on same level, unspecified, initial encounter: Secondary | ICD-10-CM | POA: Diagnosis not present

## 2018-04-01 DIAGNOSIS — Z91040 Latex allergy status: Secondary | ICD-10-CM | POA: Insufficient documentation

## 2018-04-01 DIAGNOSIS — Y999 Unspecified external cause status: Secondary | ICD-10-CM | POA: Insufficient documentation

## 2018-04-01 DIAGNOSIS — S0181XA Laceration without foreign body of other part of head, initial encounter: Secondary | ICD-10-CM | POA: Diagnosis not present

## 2018-04-01 NOTE — ED Triage Notes (Signed)
Pt BIB [facility]. Pt from [facility]. Pt found on her side by staff at her facility. Pt suffered a 2in superficial laceration to occipital portion of the head. She is CAOx1 per norm per staff. Pt appears in no distress. Pt is not on blood thinners.

## 2018-04-01 NOTE — ED Provider Notes (Signed)
[facility] DEPT Provider Note   CSN: [id number] Arrival date & time: 04/01/18  2247     History   Chief Complaint Chief Complaint  Patient presents with  . Fall  . Head Laceration    HPI Bethany Fox is a 84 y.o. female.  Patient with hx of dementia presents to the ED with a chief complaint of fall.  She is from a SNF.  She reportedly had an unwitnessed fall.  She hit her head.  It is unclear if she passed out.  Daughter states that she is acting normally. Level 5 caveat applies 2/2 dementia.  The history is provided by the patient. No language interpreter was used.    Past Medical History:  Diagnosis Date  . Breast cancer ([place])   . Cancer ([place])    breast  . Dementia   . Depression   . Gastric ulcer   . H/O mastectomy    Left side     Patient Active Problem List   Diagnosis Date Noted  . Acute bronchitis due to Rhinovirus 04/01/2017  . Acute respiratory failure with hypoxia ([place]) 04/01/2017  . Iron deficiency anemia 04/01/2017  . Pressure injury of skin 03/31/2017  . Wheezing 03/30/2017  . Anemia 03/30/2017  . Hyperglycemia 03/30/2017  . HCAP (healthcare-associated pneumonia) 03/14/2015  . Sepsis due to pneumonia ([place]) 03/14/2015  . Postoperative anemia due to acute blood loss 07/03/2014  . Acute renal insufficiency 07/03/2014  . Leukocytosis 07/01/2014  . Fall 06/30/2014  . Hip fracture, right ([place]) 06/30/2014  . Right radial head fracture 06/30/2014  . HTN (hypertension) 06/30/2014  . Hip fracture ([place]) 06/30/2014  . Dementia     Past Surgical History:  Procedure Laterality Date  . APPENDECTOMY    . CHOLECYSTECTOMY    . MASTECTOMY    . ORIF HIP FRACTURE Right 07/01/2014   Procedure: OPEN REDUCTION INTERNAL FIXATION HIP;  Surgeon: Mauri Pole, MD;  Location: [facility];  Service: Orthopedics;  Laterality: Right;     OB History   None      Home Medications    Prior to Admission medications   Medication Sig  Start Date End Date Taking? Authorizing Provider  acetaminophen (TYLENOL) 500 MG tablet Take 500 mg by mouth every 6 (six) hours as needed for fever (or for pain).     [provider]  haloperidol lactate (HALDOL) 5 MG/ML injection Inject 0.2 mLs (1 mg total) into the vein every 6 (six) hours as needed. 04/03/17   Patrecia Pour, Christean Grief, MD  ipratropium-albuterol (DUONEB) 0.5-2.5 (3) MG/3ML SOLN Take 3 mLs by nebulization every 4 (four) hours as needed. 03/19/15   Robbie Lis, MD  LORazepam (ATIVAN) 0.5 MG tablet Take 0.5 tablets (0.25 mg total) by mouth every 8 (eight) hours as needed for anxiety. Patient not taking: Reported on 03/29/2017 03/19/15   Robbie Lis, MD  magnesium hydroxide (MILK OF MAGNESIA) 400 MG/5ML suspension Take 30 mLs by mouth daily as needed for mild constipation.    [provider]  ondansetron (ZOFRAN) 4 MG tablet Take 4 mg by mouth daily as needed for nausea. 02/24/17   [provider]  risperiDONE (RISPERDAL) 0.25 MG tablet Take 1 tablet (0.25 mg total) by mouth at bedtime. Patient not taking: Reported on 03/29/2017 03/19/15   Robbie Lis, MD    Family History Family History  Family history unknown: Yes    Social History Social History   Tobacco Use  . Smoking  status: Never Smoker  . Smokeless tobacco: Never Used  Substance Use Topics  . Alcohol use: No  . Drug use: No     Allergies   Adhesive [tape]; Latex; and Ciprofloxacin   Review of Systems Review of Systems  All other systems reviewed and are negative.    Physical Exam Updated Vital Signs BP (!) 133/54 (BP Location: Left Arm)   Pulse 81   Temp 97.8 F (36.6 C) (Oral)   Resp 14   SpO2 98%   Physical Exam  Constitutional: She appears well-developed and well-nourished.  HENT:  Head: Normocephalic and atraumatic.  Eyes: Pupils are equal, round, and reactive to light. Conjunctivae and EOM are normal.  Neck: Normal range of motion. Neck supple.  Cardiovascular:  Normal rate and regular rhythm. Exam reveals no gallop and no friction rub.  No murmur heard. Pulmonary/Chest: Effort normal and breath sounds normal. No respiratory distress. She has no wheezes. She has no rales. She exhibits no tenderness.  Abdominal: Soft. Bowel sounds are normal. She exhibits no distension and no mass. There is no tenderness. There is no rebound and no guarding.  Musculoskeletal: Normal range of motion. She exhibits no edema or tenderness.  Neurological: She is alert.  Skin: Skin is warm and dry.  Psychiatric: She has a normal mood and affect. Her behavior is normal. Judgment and thought content normal.  Nursing note and vitals reviewed.    ED Treatments / Results  Labs (all labs ordered are listed, but only abnormal results are displayed) Labs Reviewed - No data to display  EKG None  Radiology Ct Head Wo Contrast  Result Date: 04/02/2018 CLINICAL DATA:  Laceration to the head EXAM: CT HEAD WITHOUT CONTRAST CT CERVICAL SPINE WITHOUT CONTRAST TECHNIQUE: Multidetector CT imaging of the head and cervical spine was performed following the standard protocol without intravenous contrast. Multiplanar CT image reconstructions of the cervical spine were also generated. COMPARISON:  06/30/2014 FINDINGS: CT HEAD FINDINGS Brain: No acute territorial infarction, hemorrhage, or intracranial mass is visualized. Mild to moderate atrophy. Mild small vessel ischemic changes of the white matter. Vascular: No hyperdense vessels.  Carotid vascular calcification. Skull: No fracture Sinuses/Orbits: Secretions or retention cyst within the sphenoid sinus, no change. Mild mucosal thickening in the ethmoid sinuses. No acute orbital abnormality. Other: Small right parietal scalp swelling CT CERVICAL SPINE FINDINGS Alignment: Trace anterolisthesis of C4 on C5, similar compared to previous. Facet alignment within normal limits. Skull base and vertebrae: No acute fracture. No primary bone lesion or focal  pathologic process. Soft tissues and spinal canal: No prevertebral fluid or swelling. No visible canal hematoma. Disc levels: Moderate degenerative changes at C3-C4, C5-C6 and C6-C7. Upper chest: Biapical scarring Other: None IMPRESSION: 1. No CT evidence for acute intracranial abnormality. Atrophy and small vessel ischemic changes of the white matter. 2. Degenerative changes of the cervical spine. No definite acute osseous abnormality Electronically Signed   By: Donavan Foil M.D.   On: 09/12/202019 00:07   Ct Cervical Spine Wo Contrast  Result Date: 04/02/2018 CLINICAL DATA:  Laceration to the head EXAM: CT HEAD WITHOUT CONTRAST CT CERVICAL SPINE WITHOUT CONTRAST TECHNIQUE: Multidetector CT imaging of the head and cervical spine was performed following the standard protocol without intravenous contrast. Multiplanar CT image reconstructions of the cervical spine were also generated. COMPARISON:  06/30/2014 FINDINGS: CT HEAD FINDINGS Brain: No acute territorial infarction, hemorrhage, or intracranial mass is visualized. Mild to moderate atrophy. Mild small vessel ischemic changes of the white matter. Vascular:  No hyperdense vessels.  Carotid vascular calcification. Skull: No fracture Sinuses/Orbits: Secretions or retention cyst within the sphenoid sinus, no change. Mild mucosal thickening in the ethmoid sinuses. No acute orbital abnormality. Other: Small right parietal scalp swelling CT CERVICAL SPINE FINDINGS Alignment: Trace anterolisthesis of C4 on C5, similar compared to previous. Facet alignment within normal limits. Skull base and vertebrae: No acute fracture. No primary bone lesion or focal pathologic process. Soft tissues and spinal canal: No prevertebral fluid or swelling. No visible canal hematoma. Disc levels: Moderate degenerative changes at C3-C4, C5-C6 and C6-C7. Upper chest: Biapical scarring Other: None IMPRESSION: 1. No CT evidence for acute intracranial abnormality. Atrophy and small vessel  ischemic changes of the white matter. 2. Degenerative changes of the cervical spine. No definite acute osseous abnormality Electronically Signed   By: Donavan Foil M.D.   On: 2020/08/1818 00:07    Procedures Procedures (including critical care time)  Medications Ordered in ED Medications - No data to display   Initial Impression / Assessment and Plan / ED Course  I have reviewed the triage vital signs and the nursing notes.  Pertinent labs & imaging results that were available during my care of the patient were reviewed by me and considered in my medical decision making (see chart for details).     Patient with fall at SNF.  Unwitnessed.  Minor lac to scalp, which when thoroughly cleansed is not through all layers of the skin and is 1 cm in length and well approximated.  Family prefers no primary closure.  This is acceptable.  Seen by and discussed with Dr. Roxanne Mins.  Final Clinical Impressions(s) / ED Diagnoses   Final diagnoses:  Injury of head, initial encounter  Fall, initial encounter    ED Discharge Orders    None       Montine Circle, PA-C 72/09/47 [id number]    Delora Fuel, MD 83/66/29 [phone]

## 2018-04-02 DIAGNOSIS — S0101XD Laceration without foreign body of scalp, subsequent encounter: Secondary | ICD-10-CM | POA: Diagnosis not present

## 2018-04-02 DIAGNOSIS — G8911 Acute pain due to trauma: Secondary | ICD-10-CM | POA: Diagnosis not present

## 2018-04-02 DIAGNOSIS — I10 Essential (primary) hypertension: Secondary | ICD-10-CM | POA: Diagnosis not present

## 2018-04-02 DIAGNOSIS — W19XXXD Unspecified fall, subsequent encounter: Secondary | ICD-10-CM | POA: Diagnosis not present

## 2018-04-02 DIAGNOSIS — F0391 Unspecified dementia with behavioral disturbance: Secondary | ICD-10-CM | POA: Diagnosis not present

## 2018-04-02 DIAGNOSIS — S0101XA Laceration without foreign body of scalp, initial encounter: Secondary | ICD-10-CM | POA: Diagnosis not present

## 2018-04-02 DIAGNOSIS — R4182 Altered mental status, unspecified: Secondary | ICD-10-CM | POA: Diagnosis not present

## 2018-04-02 MED ORDER — BACITRACIN ZINC 500 UNIT/GM EX OINT
1.0000 "application " | TOPICAL_OINTMENT | Freq: Two times a day (BID) | CUTANEOUS | Status: DC
  Administered 2018-04-02: 1 via TOPICAL
  Filled 2018-04-02: qty 0.9

## 2018-04-02 NOTE — ED Notes (Signed)
[facility] called for pt 

## 2018-04-02 NOTE — ED Notes (Signed)
[facility] was attempted to be reached to give report and was unsuccessful.

## 2018-04-02 NOTE — ED Notes (Signed)
POA signed for pt

## 2018-05-01 DIAGNOSIS — F0391 Unspecified dementia with behavioral disturbance: Secondary | ICD-10-CM | POA: Diagnosis not present

## 2018-05-01 DIAGNOSIS — E46 Unspecified protein-calorie malnutrition: Secondary | ICD-10-CM | POA: Diagnosis not present

## 2018-05-01 DIAGNOSIS — I10 Essential (primary) hypertension: Secondary | ICD-10-CM | POA: Diagnosis not present

## 2018-05-01 DIAGNOSIS — D649 Anemia, unspecified: Secondary | ICD-10-CM | POA: Diagnosis not present

## 2018-05-01 DIAGNOSIS — F29 Unspecified psychosis not due to a substance or known physiological condition: Secondary | ICD-10-CM | POA: Diagnosis not present

## 2018-06-08 DIAGNOSIS — D649 Anemia, unspecified: Secondary | ICD-10-CM | POA: Diagnosis not present

## 2018-06-08 DIAGNOSIS — H1132 Conjunctival hemorrhage, left eye: Secondary | ICD-10-CM | POA: Diagnosis not present

## 2018-06-08 DIAGNOSIS — I10 Essential (primary) hypertension: Secondary | ICD-10-CM | POA: Diagnosis not present

## 2018-06-08 DIAGNOSIS — F0391 Unspecified dementia with behavioral disturbance: Secondary | ICD-10-CM | POA: Diagnosis not present

## 2018-06-10 DIAGNOSIS — H1132 Conjunctival hemorrhage, left eye: Secondary | ICD-10-CM | POA: Diagnosis not present

## 2018-06-30 DIAGNOSIS — D649 Anemia, unspecified: Secondary | ICD-10-CM | POA: Diagnosis not present

## 2018-06-30 DIAGNOSIS — F0391 Unspecified dementia with behavioral disturbance: Secondary | ICD-10-CM | POA: Diagnosis not present

## 2018-06-30 DIAGNOSIS — I10 Essential (primary) hypertension: Secondary | ICD-10-CM | POA: Diagnosis not present

## 2018-06-30 DIAGNOSIS — H1131 Conjunctival hemorrhage, right eye: Secondary | ICD-10-CM | POA: Diagnosis not present

## 2018-07-02 DIAGNOSIS — D649 Anemia, unspecified: Secondary | ICD-10-CM | POA: Diagnosis not present

## 2018-07-02 DIAGNOSIS — I10 Essential (primary) hypertension: Secondary | ICD-10-CM | POA: Diagnosis not present

## 2018-07-02 DIAGNOSIS — E46 Unspecified protein-calorie malnutrition: Secondary | ICD-10-CM | POA: Diagnosis not present

## 2018-07-02 DIAGNOSIS — F0390 Unspecified dementia without behavioral disturbance: Secondary | ICD-10-CM | POA: Diagnosis not present

## 2018-07-02 DIAGNOSIS — H1130 Conjunctival hemorrhage, unspecified eye: Secondary | ICD-10-CM | POA: Diagnosis not present

## 2018-07-03 DIAGNOSIS — R05 Cough: Secondary | ICD-10-CM | POA: Diagnosis not present

## 2018-08-13 DIAGNOSIS — D649 Anemia, unspecified: Secondary | ICD-10-CM | POA: Diagnosis not present

## 2018-08-13 DIAGNOSIS — C50919 Malignant neoplasm of unspecified site of unspecified female breast: Secondary | ICD-10-CM | POA: Diagnosis not present

## 2018-08-13 DIAGNOSIS — F322 Major depressive disorder, single episode, severe without psychotic features: Secondary | ICD-10-CM | POA: Diagnosis not present

## 2018-08-13 DIAGNOSIS — E46 Unspecified protein-calorie malnutrition: Secondary | ICD-10-CM | POA: Diagnosis not present

## 2018-08-13 DIAGNOSIS — I10 Essential (primary) hypertension: Secondary | ICD-10-CM | POA: Diagnosis not present

## 2018-08-13 DIAGNOSIS — F0391 Unspecified dementia with behavioral disturbance: Secondary | ICD-10-CM | POA: Diagnosis not present

## 2018-08-27 DIAGNOSIS — L603 Nail dystrophy: Secondary | ICD-10-CM | POA: Diagnosis not present

## 2018-08-27 DIAGNOSIS — I10 Essential (primary) hypertension: Secondary | ICD-10-CM | POA: Diagnosis not present

## 2018-08-27 DIAGNOSIS — Q845 Enlarged and hypertrophic nails: Secondary | ICD-10-CM | POA: Diagnosis not present

## 2018-08-27 DIAGNOSIS — F341 Dysthymic disorder: Secondary | ICD-10-CM | POA: Diagnosis not present

## 2018-08-27 DIAGNOSIS — I739 Peripheral vascular disease, unspecified: Secondary | ICD-10-CM | POA: Diagnosis not present

## 2018-08-27 DIAGNOSIS — F0391 Unspecified dementia with behavioral disturbance: Secondary | ICD-10-CM | POA: Diagnosis not present

## 2018-08-27 DIAGNOSIS — H02105 Unspecified ectropion of left lower eyelid: Secondary | ICD-10-CM | POA: Diagnosis not present

## 2018-08-27 DIAGNOSIS — B351 Tinea unguium: Secondary | ICD-10-CM | POA: Diagnosis not present

## 2018-09-02 DIAGNOSIS — L0201 Cutaneous abscess of face: Secondary | ICD-10-CM | POA: Diagnosis not present

## 2018-09-02 DIAGNOSIS — H02105 Unspecified ectropion of left lower eyelid: Secondary | ICD-10-CM | POA: Diagnosis not present

## 2018-09-02 DIAGNOSIS — D649 Anemia, unspecified: Secondary | ICD-10-CM | POA: Diagnosis not present

## 2018-09-02 DIAGNOSIS — F0391 Unspecified dementia with behavioral disturbance: Secondary | ICD-10-CM | POA: Diagnosis not present

## 2018-09-14 DIAGNOSIS — I10 Essential (primary) hypertension: Secondary | ICD-10-CM | POA: Diagnosis not present

## 2018-09-14 DIAGNOSIS — H02105 Unspecified ectropion of left lower eyelid: Secondary | ICD-10-CM | POA: Diagnosis not present

## 2018-09-14 DIAGNOSIS — F0391 Unspecified dementia with behavioral disturbance: Secondary | ICD-10-CM | POA: Diagnosis not present

## 2018-09-14 DIAGNOSIS — H0015 Chalazion left lower eyelid: Secondary | ICD-10-CM | POA: Diagnosis not present

## 2018-09-17 DIAGNOSIS — H0015 Chalazion left lower eyelid: Secondary | ICD-10-CM | POA: Diagnosis not present

## 2018-09-24 DIAGNOSIS — W19XXXD Unspecified fall, subsequent encounter: Secondary | ICD-10-CM | POA: Diagnosis not present

## 2018-09-24 DIAGNOSIS — S0083XA Contusion of other part of head, initial encounter: Secondary | ICD-10-CM | POA: Diagnosis not present

## 2018-09-24 DIAGNOSIS — R52 Pain, unspecified: Secondary | ICD-10-CM | POA: Diagnosis not present

## 2018-09-24 DIAGNOSIS — I10 Essential (primary) hypertension: Secondary | ICD-10-CM | POA: Diagnosis not present

## 2018-09-24 DIAGNOSIS — F0391 Unspecified dementia with behavioral disturbance: Secondary | ICD-10-CM | POA: Diagnosis not present

## 2018-10-02 DIAGNOSIS — F0391 Unspecified dementia with behavioral disturbance: Secondary | ICD-10-CM | POA: Diagnosis not present

## 2018-10-02 DIAGNOSIS — C50919 Malignant neoplasm of unspecified site of unspecified female breast: Secondary | ICD-10-CM | POA: Diagnosis not present

## 2018-10-02 DIAGNOSIS — D649 Anemia, unspecified: Secondary | ICD-10-CM | POA: Diagnosis not present

## 2018-10-02 DIAGNOSIS — R2689 Other abnormalities of gait and mobility: Secondary | ICD-10-CM | POA: Diagnosis not present

## 2018-10-02 DIAGNOSIS — I10 Essential (primary) hypertension: Secondary | ICD-10-CM | POA: Diagnosis not present

## 2018-10-02 DIAGNOSIS — E46 Unspecified protein-calorie malnutrition: Secondary | ICD-10-CM | POA: Diagnosis not present

## 2018-12-04 DIAGNOSIS — F322 Major depressive disorder, single episode, severe without psychotic features: Secondary | ICD-10-CM | POA: Diagnosis not present

## 2018-12-04 DIAGNOSIS — D649 Anemia, unspecified: Secondary | ICD-10-CM | POA: Diagnosis not present

## 2018-12-04 DIAGNOSIS — F0391 Unspecified dementia with behavioral disturbance: Secondary | ICD-10-CM | POA: Diagnosis not present

## 2018-12-04 DIAGNOSIS — C50919 Malignant neoplasm of unspecified site of unspecified female breast: Secondary | ICD-10-CM | POA: Diagnosis not present

## 2018-12-04 DIAGNOSIS — I10 Essential (primary) hypertension: Secondary | ICD-10-CM | POA: Diagnosis not present

## 2018-12-04 DIAGNOSIS — R2689 Other abnormalities of gait and mobility: Secondary | ICD-10-CM | POA: Diagnosis not present

## 2018-12-04 DIAGNOSIS — H02104 Unspecified ectropion of left upper eyelid: Secondary | ICD-10-CM | POA: Diagnosis not present

## 2018-12-07 DIAGNOSIS — I10 Essential (primary) hypertension: Secondary | ICD-10-CM | POA: Diagnosis not present

## 2018-12-07 DIAGNOSIS — F0390 Unspecified dementia without behavioral disturbance: Secondary | ICD-10-CM | POA: Diagnosis not present

## 2018-12-07 DIAGNOSIS — F0391 Unspecified dementia with behavioral disturbance: Secondary | ICD-10-CM | POA: Diagnosis not present

## 2018-12-07 DIAGNOSIS — R41841 Cognitive communication deficit: Secondary | ICD-10-CM | POA: Diagnosis not present

## 2018-12-07 DIAGNOSIS — R05 Cough: Secondary | ICD-10-CM | POA: Diagnosis not present

## 2018-12-07 DIAGNOSIS — J189 Pneumonia, unspecified organism: Secondary | ICD-10-CM | POA: Diagnosis not present

## 2018-12-07 DIAGNOSIS — F341 Dysthymic disorder: Secondary | ICD-10-CM | POA: Diagnosis not present

## 2018-12-07 DIAGNOSIS — M6281 Muscle weakness (generalized): Secondary | ICD-10-CM | POA: Diagnosis not present

## 2018-12-07 DIAGNOSIS — R627 Adult failure to thrive: Secondary | ICD-10-CM | POA: Diagnosis not present

## 2018-12-07 DIAGNOSIS — Z9181 History of falling: Secondary | ICD-10-CM | POA: Diagnosis not present

## 2018-12-07 DIAGNOSIS — R278 Other lack of coordination: Secondary | ICD-10-CM | POA: Diagnosis not present

## 2018-12-08 DIAGNOSIS — Z9181 History of falling: Secondary | ICD-10-CM | POA: Diagnosis not present

## 2018-12-08 DIAGNOSIS — R278 Other lack of coordination: Secondary | ICD-10-CM | POA: Diagnosis not present

## 2018-12-08 DIAGNOSIS — F0390 Unspecified dementia without behavioral disturbance: Secondary | ICD-10-CM | POA: Diagnosis not present

## 2018-12-08 DIAGNOSIS — R627 Adult failure to thrive: Secondary | ICD-10-CM | POA: Diagnosis not present

## 2018-12-08 DIAGNOSIS — R05 Cough: Secondary | ICD-10-CM | POA: Diagnosis not present

## 2018-12-08 DIAGNOSIS — I10 Essential (primary) hypertension: Secondary | ICD-10-CM | POA: Diagnosis not present

## 2018-12-08 DIAGNOSIS — M6281 Muscle weakness (generalized): Secondary | ICD-10-CM | POA: Diagnosis not present

## 2018-12-09 DIAGNOSIS — Z9181 History of falling: Secondary | ICD-10-CM | POA: Diagnosis not present

## 2018-12-09 DIAGNOSIS — M6281 Muscle weakness (generalized): Secondary | ICD-10-CM | POA: Diagnosis not present

## 2018-12-09 DIAGNOSIS — I10 Essential (primary) hypertension: Secondary | ICD-10-CM | POA: Diagnosis not present

## 2018-12-09 DIAGNOSIS — R627 Adult failure to thrive: Secondary | ICD-10-CM | POA: Diagnosis not present

## 2018-12-09 DIAGNOSIS — F0390 Unspecified dementia without behavioral disturbance: Secondary | ICD-10-CM | POA: Diagnosis not present

## 2018-12-09 DIAGNOSIS — R278 Other lack of coordination: Secondary | ICD-10-CM | POA: Diagnosis not present

## 2018-12-10 DIAGNOSIS — I10 Essential (primary) hypertension: Secondary | ICD-10-CM | POA: Diagnosis not present

## 2018-12-10 DIAGNOSIS — R278 Other lack of coordination: Secondary | ICD-10-CM | POA: Diagnosis not present

## 2018-12-10 DIAGNOSIS — F0390 Unspecified dementia without behavioral disturbance: Secondary | ICD-10-CM | POA: Diagnosis not present

## 2018-12-10 DIAGNOSIS — M6281 Muscle weakness (generalized): Secondary | ICD-10-CM | POA: Diagnosis not present

## 2018-12-10 DIAGNOSIS — R627 Adult failure to thrive: Secondary | ICD-10-CM | POA: Diagnosis not present

## 2018-12-10 DIAGNOSIS — Z9181 History of falling: Secondary | ICD-10-CM | POA: Diagnosis not present

## 2018-12-11 DIAGNOSIS — R627 Adult failure to thrive: Secondary | ICD-10-CM | POA: Diagnosis not present

## 2018-12-11 DIAGNOSIS — R278 Other lack of coordination: Secondary | ICD-10-CM | POA: Diagnosis not present

## 2018-12-11 DIAGNOSIS — Z9181 History of falling: Secondary | ICD-10-CM | POA: Diagnosis not present

## 2018-12-11 DIAGNOSIS — F0390 Unspecified dementia without behavioral disturbance: Secondary | ICD-10-CM | POA: Diagnosis not present

## 2018-12-11 DIAGNOSIS — M6281 Muscle weakness (generalized): Secondary | ICD-10-CM | POA: Diagnosis not present

## 2018-12-11 DIAGNOSIS — I10 Essential (primary) hypertension: Secondary | ICD-10-CM | POA: Diagnosis not present

## 2018-12-14 DIAGNOSIS — R278 Other lack of coordination: Secondary | ICD-10-CM | POA: Diagnosis not present

## 2018-12-14 DIAGNOSIS — I10 Essential (primary) hypertension: Secondary | ICD-10-CM | POA: Diagnosis not present

## 2018-12-14 DIAGNOSIS — M6281 Muscle weakness (generalized): Secondary | ICD-10-CM | POA: Diagnosis not present

## 2018-12-14 DIAGNOSIS — F0390 Unspecified dementia without behavioral disturbance: Secondary | ICD-10-CM | POA: Diagnosis not present

## 2018-12-14 DIAGNOSIS — Z9181 History of falling: Secondary | ICD-10-CM | POA: Diagnosis not present

## 2018-12-14 DIAGNOSIS — R627 Adult failure to thrive: Secondary | ICD-10-CM | POA: Diagnosis not present

## 2018-12-15 DIAGNOSIS — R627 Adult failure to thrive: Secondary | ICD-10-CM | POA: Diagnosis not present

## 2018-12-15 DIAGNOSIS — F0390 Unspecified dementia without behavioral disturbance: Secondary | ICD-10-CM | POA: Diagnosis not present

## 2018-12-15 DIAGNOSIS — I10 Essential (primary) hypertension: Secondary | ICD-10-CM | POA: Diagnosis not present

## 2018-12-15 DIAGNOSIS — R278 Other lack of coordination: Secondary | ICD-10-CM | POA: Diagnosis not present

## 2018-12-15 DIAGNOSIS — Z9181 History of falling: Secondary | ICD-10-CM | POA: Diagnosis not present

## 2018-12-15 DIAGNOSIS — M6281 Muscle weakness (generalized): Secondary | ICD-10-CM | POA: Diagnosis not present

## 2019-01-08 DIAGNOSIS — I10 Essential (primary) hypertension: Secondary | ICD-10-CM | POA: Diagnosis not present

## 2019-01-08 DIAGNOSIS — F0391 Unspecified dementia with behavioral disturbance: Secondary | ICD-10-CM | POA: Diagnosis not present

## 2019-01-08 DIAGNOSIS — H0015 Chalazion left lower eyelid: Secondary | ICD-10-CM | POA: Diagnosis not present

## 2019-01-08 DIAGNOSIS — H02105 Unspecified ectropion of left lower eyelid: Secondary | ICD-10-CM | POA: Diagnosis not present

## 2019-02-05 DIAGNOSIS — I10 Essential (primary) hypertension: Secondary | ICD-10-CM | POA: Diagnosis not present

## 2019-02-05 DIAGNOSIS — H02104 Unspecified ectropion of left upper eyelid: Secondary | ICD-10-CM | POA: Diagnosis not present

## 2019-02-05 DIAGNOSIS — E46 Unspecified protein-calorie malnutrition: Secondary | ICD-10-CM | POA: Diagnosis not present

## 2019-02-05 DIAGNOSIS — F0390 Unspecified dementia without behavioral disturbance: Secondary | ICD-10-CM | POA: Diagnosis not present

## 2019-02-05 DIAGNOSIS — F322 Major depressive disorder, single episode, severe without psychotic features: Secondary | ICD-10-CM | POA: Diagnosis not present

## 2019-02-17 DIAGNOSIS — R05 Cough: Secondary | ICD-10-CM | POA: Diagnosis not present

## 2019-02-18 DIAGNOSIS — F0391 Unspecified dementia with behavioral disturbance: Secondary | ICD-10-CM | POA: Diagnosis not present

## 2019-02-18 DIAGNOSIS — I10 Essential (primary) hypertension: Secondary | ICD-10-CM | POA: Diagnosis not present

## 2019-02-18 DIAGNOSIS — R05 Cough: Secondary | ICD-10-CM | POA: Diagnosis not present

## 2019-02-18 DIAGNOSIS — J849 Interstitial pulmonary disease, unspecified: Secondary | ICD-10-CM | POA: Diagnosis not present

## 2019-03-02 DIAGNOSIS — F0391 Unspecified dementia with behavioral disturbance: Secondary | ICD-10-CM | POA: Diagnosis not present

## 2019-03-02 DIAGNOSIS — J180 Bronchopneumonia, unspecified organism: Secondary | ICD-10-CM | POA: Diagnosis not present

## 2019-03-02 DIAGNOSIS — J849 Interstitial pulmonary disease, unspecified: Secondary | ICD-10-CM | POA: Diagnosis not present

## 2019-03-12 DIAGNOSIS — J849 Interstitial pulmonary disease, unspecified: Secondary | ICD-10-CM | POA: Diagnosis not present

## 2019-03-12 DIAGNOSIS — R05 Cough: Secondary | ICD-10-CM | POA: Diagnosis not present

## 2019-03-12 DIAGNOSIS — I10 Essential (primary) hypertension: Secondary | ICD-10-CM | POA: Diagnosis not present

## 2019-03-12 DIAGNOSIS — F0391 Unspecified dementia with behavioral disturbance: Secondary | ICD-10-CM | POA: Diagnosis not present

## 2019-03-27 DIAGNOSIS — R5383 Other fatigue: Secondary | ICD-10-CM | POA: Diagnosis not present

## 2019-03-27 DIAGNOSIS — R05 Cough: Secondary | ICD-10-CM | POA: Diagnosis not present

## 2019-03-27 DIAGNOSIS — J849 Interstitial pulmonary disease, unspecified: Secondary | ICD-10-CM | POA: Diagnosis not present

## 2019-03-27 DIAGNOSIS — I10 Essential (primary) hypertension: Secondary | ICD-10-CM | POA: Diagnosis not present

## 2019-03-28 DIAGNOSIS — R05 Cough: Secondary | ICD-10-CM | POA: Diagnosis not present

## 2019-03-28 DIAGNOSIS — I10 Essential (primary) hypertension: Secondary | ICD-10-CM | POA: Diagnosis not present

## 2019-03-28 DIAGNOSIS — R5383 Other fatigue: Secondary | ICD-10-CM | POA: Diagnosis not present

## 2019-03-28 DIAGNOSIS — J849 Interstitial pulmonary disease, unspecified: Secondary | ICD-10-CM | POA: Diagnosis not present

## 2019-04-02 DIAGNOSIS — F322 Major depressive disorder, single episode, severe without psychotic features: Secondary | ICD-10-CM | POA: Diagnosis not present

## 2019-04-02 DIAGNOSIS — F0151 Vascular dementia with behavioral disturbance: Secondary | ICD-10-CM | POA: Diagnosis not present

## 2019-04-02 DIAGNOSIS — I10 Essential (primary) hypertension: Secondary | ICD-10-CM | POA: Diagnosis not present

## 2019-04-02 DIAGNOSIS — D638 Anemia in other chronic diseases classified elsewhere: Secondary | ICD-10-CM | POA: Diagnosis not present

## 2019-04-02 DIAGNOSIS — E440 Moderate protein-calorie malnutrition: Secondary | ICD-10-CM | POA: Diagnosis not present

## 2019-05-05 DIAGNOSIS — R51 Headache: Secondary | ICD-10-CM | POA: Diagnosis not present

## 2019-05-07 DIAGNOSIS — F0391 Unspecified dementia with behavioral disturbance: Secondary | ICD-10-CM | POA: Diagnosis not present

## 2019-05-07 DIAGNOSIS — W19XXXD Unspecified fall, subsequent encounter: Secondary | ICD-10-CM | POA: Diagnosis not present

## 2019-05-07 DIAGNOSIS — I10 Essential (primary) hypertension: Secondary | ICD-10-CM | POA: Diagnosis not present

## 2019-05-07 DIAGNOSIS — S0101XD Laceration without foreign body of scalp, subsequent encounter: Secondary | ICD-10-CM | POA: Diagnosis not present

## 2019-05-21 DIAGNOSIS — E119 Type 2 diabetes mellitus without complications: Secondary | ICD-10-CM | POA: Diagnosis not present

## 2019-05-21 DIAGNOSIS — I10 Essential (primary) hypertension: Secondary | ICD-10-CM | POA: Diagnosis not present

## 2019-05-21 DIAGNOSIS — F22 Delusional disorders: Secondary | ICD-10-CM | POA: Diagnosis not present

## 2019-05-21 DIAGNOSIS — F341 Dysthymic disorder: Secondary | ICD-10-CM | POA: Diagnosis not present

## 2019-05-21 DIAGNOSIS — D72829 Elevated white blood cell count, unspecified: Secondary | ICD-10-CM | POA: Diagnosis not present

## 2019-05-21 DIAGNOSIS — M6281 Muscle weakness (generalized): Secondary | ICD-10-CM | POA: Diagnosis not present

## 2019-05-21 DIAGNOSIS — R41841 Cognitive communication deficit: Secondary | ICD-10-CM | POA: Diagnosis not present

## 2019-05-21 DIAGNOSIS — D649 Anemia, unspecified: Secondary | ICD-10-CM | POA: Diagnosis not present

## 2019-05-21 DIAGNOSIS — E559 Vitamin D deficiency, unspecified: Secondary | ICD-10-CM | POA: Diagnosis not present

## 2019-05-21 DIAGNOSIS — R278 Other lack of coordination: Secondary | ICD-10-CM | POA: Diagnosis not present

## 2019-05-21 DIAGNOSIS — R05 Cough: Secondary | ICD-10-CM | POA: Diagnosis not present

## 2019-05-21 DIAGNOSIS — R627 Adult failure to thrive: Secondary | ICD-10-CM | POA: Diagnosis not present

## 2019-05-21 DIAGNOSIS — F0390 Unspecified dementia without behavioral disturbance: Secondary | ICD-10-CM | POA: Diagnosis not present

## 2019-05-21 DIAGNOSIS — E039 Hypothyroidism, unspecified: Secondary | ICD-10-CM | POA: Diagnosis not present

## 2019-05-21 DIAGNOSIS — R093 Abnormal sputum: Secondary | ICD-10-CM | POA: Diagnosis not present

## 2019-05-21 DIAGNOSIS — F0391 Unspecified dementia with behavioral disturbance: Secondary | ICD-10-CM | POA: Diagnosis not present

## 2019-05-21 DIAGNOSIS — J189 Pneumonia, unspecified organism: Secondary | ICD-10-CM | POA: Diagnosis not present

## 2019-05-21 DIAGNOSIS — Z9181 History of falling: Secondary | ICD-10-CM | POA: Diagnosis not present

## 2019-05-25 DIAGNOSIS — F0391 Unspecified dementia with behavioral disturbance: Secondary | ICD-10-CM | POA: Diagnosis not present

## 2019-05-25 DIAGNOSIS — F341 Dysthymic disorder: Secondary | ICD-10-CM | POA: Diagnosis not present

## 2019-05-25 DIAGNOSIS — J189 Pneumonia, unspecified organism: Secondary | ICD-10-CM | POA: Diagnosis not present

## 2019-05-25 DIAGNOSIS — I10 Essential (primary) hypertension: Secondary | ICD-10-CM | POA: Diagnosis not present

## 2019-06-01 DIAGNOSIS — D649 Anemia, unspecified: Secondary | ICD-10-CM | POA: Diagnosis not present

## 2019-06-01 DIAGNOSIS — J189 Pneumonia, unspecified organism: Secondary | ICD-10-CM | POA: Diagnosis not present

## 2019-06-01 DIAGNOSIS — I10 Essential (primary) hypertension: Secondary | ICD-10-CM | POA: Diagnosis not present

## 2019-06-01 DIAGNOSIS — F0391 Unspecified dementia with behavioral disturbance: Secondary | ICD-10-CM | POA: Diagnosis not present

## 2019-06-08 DIAGNOSIS — R1312 Dysphagia, oropharyngeal phase: Secondary | ICD-10-CM | POA: Diagnosis not present

## 2019-06-08 DIAGNOSIS — Z9181 History of falling: Secondary | ICD-10-CM | POA: Diagnosis not present

## 2019-06-08 DIAGNOSIS — F22 Delusional disorders: Secondary | ICD-10-CM | POA: Diagnosis not present

## 2019-06-08 DIAGNOSIS — R627 Adult failure to thrive: Secondary | ICD-10-CM | POA: Diagnosis not present

## 2019-06-08 DIAGNOSIS — R41841 Cognitive communication deficit: Secondary | ICD-10-CM | POA: Diagnosis not present

## 2019-06-08 DIAGNOSIS — J189 Pneumonia, unspecified organism: Secondary | ICD-10-CM | POA: Diagnosis not present

## 2019-06-08 DIAGNOSIS — I10 Essential (primary) hypertension: Secondary | ICD-10-CM | POA: Diagnosis not present

## 2019-06-08 DIAGNOSIS — M6281 Muscle weakness (generalized): Secondary | ICD-10-CM | POA: Diagnosis not present

## 2019-06-09 DIAGNOSIS — F0391 Unspecified dementia with behavioral disturbance: Secondary | ICD-10-CM | POA: Diagnosis not present

## 2019-06-09 DIAGNOSIS — M6281 Muscle weakness (generalized): Secondary | ICD-10-CM | POA: Diagnosis not present

## 2019-06-09 DIAGNOSIS — Z9181 History of falling: Secondary | ICD-10-CM | POA: Diagnosis not present

## 2019-06-09 DIAGNOSIS — R1312 Dysphagia, oropharyngeal phase: Secondary | ICD-10-CM | POA: Diagnosis not present

## 2019-06-09 DIAGNOSIS — R627 Adult failure to thrive: Secondary | ICD-10-CM | POA: Diagnosis not present

## 2019-06-09 DIAGNOSIS — F341 Dysthymic disorder: Secondary | ICD-10-CM | POA: Diagnosis not present

## 2019-06-09 DIAGNOSIS — J189 Pneumonia, unspecified organism: Secondary | ICD-10-CM | POA: Diagnosis not present

## 2019-06-09 DIAGNOSIS — I10 Essential (primary) hypertension: Secondary | ICD-10-CM | POA: Diagnosis not present

## 2019-06-10 DIAGNOSIS — R1312 Dysphagia, oropharyngeal phase: Secondary | ICD-10-CM | POA: Diagnosis not present

## 2019-06-10 DIAGNOSIS — R627 Adult failure to thrive: Secondary | ICD-10-CM | POA: Diagnosis not present

## 2019-06-10 DIAGNOSIS — I10 Essential (primary) hypertension: Secondary | ICD-10-CM | POA: Diagnosis not present

## 2019-06-10 DIAGNOSIS — Z9181 History of falling: Secondary | ICD-10-CM | POA: Diagnosis not present

## 2019-06-10 DIAGNOSIS — M6281 Muscle weakness (generalized): Secondary | ICD-10-CM | POA: Diagnosis not present

## 2019-06-10 DIAGNOSIS — J189 Pneumonia, unspecified organism: Secondary | ICD-10-CM | POA: Diagnosis not present

## 2019-06-11 DIAGNOSIS — J189 Pneumonia, unspecified organism: Secondary | ICD-10-CM | POA: Diagnosis not present

## 2019-06-11 DIAGNOSIS — F322 Major depressive disorder, single episode, severe without psychotic features: Secondary | ICD-10-CM | POA: Diagnosis not present

## 2019-06-11 DIAGNOSIS — D638 Anemia in other chronic diseases classified elsewhere: Secondary | ICD-10-CM | POA: Diagnosis not present

## 2019-06-11 DIAGNOSIS — I10 Essential (primary) hypertension: Secondary | ICD-10-CM | POA: Diagnosis not present

## 2019-06-11 DIAGNOSIS — E440 Moderate protein-calorie malnutrition: Secondary | ICD-10-CM | POA: Diagnosis not present

## 2019-06-11 DIAGNOSIS — M6281 Muscle weakness (generalized): Secondary | ICD-10-CM | POA: Diagnosis not present

## 2019-06-11 DIAGNOSIS — R1312 Dysphagia, oropharyngeal phase: Secondary | ICD-10-CM | POA: Diagnosis not present

## 2019-06-11 DIAGNOSIS — F0151 Vascular dementia with behavioral disturbance: Secondary | ICD-10-CM | POA: Diagnosis not present

## 2019-06-11 DIAGNOSIS — Z9181 History of falling: Secondary | ICD-10-CM | POA: Diagnosis not present

## 2019-06-11 DIAGNOSIS — R627 Adult failure to thrive: Secondary | ICD-10-CM | POA: Diagnosis not present

## 2019-06-14 DIAGNOSIS — R627 Adult failure to thrive: Secondary | ICD-10-CM | POA: Diagnosis not present

## 2019-06-14 DIAGNOSIS — Z9181 History of falling: Secondary | ICD-10-CM | POA: Diagnosis not present

## 2019-06-14 DIAGNOSIS — R1312 Dysphagia, oropharyngeal phase: Secondary | ICD-10-CM | POA: Diagnosis not present

## 2019-06-14 DIAGNOSIS — I10 Essential (primary) hypertension: Secondary | ICD-10-CM | POA: Diagnosis not present

## 2019-06-14 DIAGNOSIS — J189 Pneumonia, unspecified organism: Secondary | ICD-10-CM | POA: Diagnosis not present

## 2019-06-14 DIAGNOSIS — M6281 Muscle weakness (generalized): Secondary | ICD-10-CM | POA: Diagnosis not present

## 2019-06-30 DIAGNOSIS — Z20828 Contact with and (suspected) exposure to other viral communicable diseases: Secondary | ICD-10-CM | POA: Diagnosis not present

## 2019-07-07 DIAGNOSIS — Z20828 Contact with and (suspected) exposure to other viral communicable diseases: Secondary | ICD-10-CM | POA: Diagnosis not present

## 2019-07-14 DIAGNOSIS — Z20828 Contact with and (suspected) exposure to other viral communicable diseases: Secondary | ICD-10-CM | POA: Diagnosis not present

## 2019-07-18 DIAGNOSIS — R0989 Other specified symptoms and signs involving the circulatory and respiratory systems: Secondary | ICD-10-CM | POA: Diagnosis not present

## 2019-07-19 DIAGNOSIS — J849 Interstitial pulmonary disease, unspecified: Secondary | ICD-10-CM | POA: Diagnosis not present

## 2019-07-19 DIAGNOSIS — I10 Essential (primary) hypertension: Secondary | ICD-10-CM | POA: Diagnosis not present

## 2019-07-19 DIAGNOSIS — F0391 Unspecified dementia with behavioral disturbance: Secondary | ICD-10-CM | POA: Diagnosis not present

## 2019-07-19 DIAGNOSIS — R05 Cough: Secondary | ICD-10-CM | POA: Diagnosis not present

## 2019-07-21 DIAGNOSIS — Z20828 Contact with and (suspected) exposure to other viral communicable diseases: Secondary | ICD-10-CM | POA: Diagnosis not present

## 2019-08-01 DIAGNOSIS — J849 Interstitial pulmonary disease, unspecified: Secondary | ICD-10-CM | POA: Diagnosis not present

## 2019-08-01 DIAGNOSIS — H02105 Unspecified ectropion of left lower eyelid: Secondary | ICD-10-CM | POA: Diagnosis not present

## 2019-08-01 DIAGNOSIS — E559 Vitamin D deficiency, unspecified: Secondary | ICD-10-CM | POA: Diagnosis not present

## 2019-08-01 DIAGNOSIS — D649 Anemia, unspecified: Secondary | ICD-10-CM | POA: Diagnosis not present

## 2019-08-01 DIAGNOSIS — F0391 Unspecified dementia with behavioral disturbance: Secondary | ICD-10-CM | POA: Diagnosis not present

## 2019-08-01 DIAGNOSIS — F341 Dysthymic disorder: Secondary | ICD-10-CM | POA: Diagnosis not present

## 2019-08-01 DIAGNOSIS — I10 Essential (primary) hypertension: Secondary | ICD-10-CM | POA: Diagnosis not present

## 2019-08-05 DIAGNOSIS — S0083XA Contusion of other part of head, initial encounter: Secondary | ICD-10-CM | POA: Diagnosis not present

## 2019-08-05 DIAGNOSIS — I10 Essential (primary) hypertension: Secondary | ICD-10-CM | POA: Diagnosis not present

## 2019-08-05 DIAGNOSIS — D649 Anemia, unspecified: Secondary | ICD-10-CM | POA: Diagnosis not present

## 2019-08-05 DIAGNOSIS — F0391 Unspecified dementia with behavioral disturbance: Secondary | ICD-10-CM | POA: Diagnosis not present

## 2019-08-20 DIAGNOSIS — I10 Essential (primary) hypertension: Secondary | ICD-10-CM | POA: Diagnosis not present

## 2019-08-20 DIAGNOSIS — F0151 Vascular dementia with behavioral disturbance: Secondary | ICD-10-CM | POA: Diagnosis not present

## 2019-08-20 DIAGNOSIS — E440 Moderate protein-calorie malnutrition: Secondary | ICD-10-CM | POA: Diagnosis not present

## 2019-08-20 DIAGNOSIS — D638 Anemia in other chronic diseases classified elsewhere: Secondary | ICD-10-CM | POA: Diagnosis not present

## 2019-08-20 DIAGNOSIS — F322 Major depressive disorder, single episode, severe without psychotic features: Secondary | ICD-10-CM | POA: Diagnosis not present

## 2019-08-24 DIAGNOSIS — R627 Adult failure to thrive: Secondary | ICD-10-CM | POA: Diagnosis not present

## 2019-08-24 DIAGNOSIS — D649 Anemia, unspecified: Secondary | ICD-10-CM | POA: Diagnosis not present

## 2019-08-26 DIAGNOSIS — I10 Essential (primary) hypertension: Secondary | ICD-10-CM | POA: Diagnosis not present

## 2019-08-26 DIAGNOSIS — W19XXXD Unspecified fall, subsequent encounter: Secondary | ICD-10-CM | POA: Diagnosis not present

## 2019-08-26 DIAGNOSIS — D649 Anemia, unspecified: Secondary | ICD-10-CM | POA: Diagnosis not present

## 2019-08-26 DIAGNOSIS — F0391 Unspecified dementia with behavioral disturbance: Secondary | ICD-10-CM | POA: Diagnosis not present

## 2019-08-27 DIAGNOSIS — R627 Adult failure to thrive: Secondary | ICD-10-CM | POA: Diagnosis not present

## 2019-08-27 DIAGNOSIS — R1311 Dysphagia, oral phase: Secondary | ICD-10-CM | POA: Diagnosis not present

## 2019-08-27 DIAGNOSIS — F22 Delusional disorders: Secondary | ICD-10-CM | POA: Diagnosis not present

## 2019-08-27 DIAGNOSIS — J189 Pneumonia, unspecified organism: Secondary | ICD-10-CM | POA: Diagnosis not present

## 2019-08-27 DIAGNOSIS — F0390 Unspecified dementia without behavioral disturbance: Secondary | ICD-10-CM | POA: Diagnosis not present

## 2019-08-27 DIAGNOSIS — Z9181 History of falling: Secondary | ICD-10-CM | POA: Diagnosis not present

## 2019-08-27 DIAGNOSIS — I10 Essential (primary) hypertension: Secondary | ICD-10-CM | POA: Diagnosis not present

## 2019-08-27 DIAGNOSIS — R41841 Cognitive communication deficit: Secondary | ICD-10-CM | POA: Diagnosis not present

## 2019-08-27 DIAGNOSIS — M6281 Muscle weakness (generalized): Secondary | ICD-10-CM | POA: Diagnosis not present

## 2019-08-30 DIAGNOSIS — R627 Adult failure to thrive: Secondary | ICD-10-CM | POA: Diagnosis not present

## 2019-08-30 DIAGNOSIS — M6281 Muscle weakness (generalized): Secondary | ICD-10-CM | POA: Diagnosis not present

## 2019-08-30 DIAGNOSIS — J189 Pneumonia, unspecified organism: Secondary | ICD-10-CM | POA: Diagnosis not present

## 2019-08-30 DIAGNOSIS — I10 Essential (primary) hypertension: Secondary | ICD-10-CM | POA: Diagnosis not present

## 2019-08-30 DIAGNOSIS — F0390 Unspecified dementia without behavioral disturbance: Secondary | ICD-10-CM | POA: Diagnosis not present

## 2019-08-30 DIAGNOSIS — Z9181 History of falling: Secondary | ICD-10-CM | POA: Diagnosis not present

## 2019-08-31 DIAGNOSIS — F0390 Unspecified dementia without behavioral disturbance: Secondary | ICD-10-CM | POA: Diagnosis not present

## 2019-08-31 DIAGNOSIS — Z9181 History of falling: Secondary | ICD-10-CM | POA: Diagnosis not present

## 2019-08-31 DIAGNOSIS — R1311 Dysphagia, oral phase: Secondary | ICD-10-CM | POA: Diagnosis not present

## 2019-08-31 DIAGNOSIS — F22 Delusional disorders: Secondary | ICD-10-CM | POA: Diagnosis not present

## 2019-08-31 DIAGNOSIS — R41841 Cognitive communication deficit: Secondary | ICD-10-CM | POA: Diagnosis not present

## 2019-08-31 DIAGNOSIS — R627 Adult failure to thrive: Secondary | ICD-10-CM | POA: Diagnosis not present

## 2019-08-31 DIAGNOSIS — M6281 Muscle weakness (generalized): Secondary | ICD-10-CM | POA: Diagnosis not present

## 2019-08-31 DIAGNOSIS — J189 Pneumonia, unspecified organism: Secondary | ICD-10-CM | POA: Diagnosis not present

## 2019-08-31 DIAGNOSIS — I10 Essential (primary) hypertension: Secondary | ICD-10-CM | POA: Diagnosis not present

## 2019-09-01 DIAGNOSIS — F0390 Unspecified dementia without behavioral disturbance: Secondary | ICD-10-CM | POA: Diagnosis not present

## 2019-09-01 DIAGNOSIS — I10 Essential (primary) hypertension: Secondary | ICD-10-CM | POA: Diagnosis not present

## 2019-09-01 DIAGNOSIS — J189 Pneumonia, unspecified organism: Secondary | ICD-10-CM | POA: Diagnosis not present

## 2019-09-01 DIAGNOSIS — Z9181 History of falling: Secondary | ICD-10-CM | POA: Diagnosis not present

## 2019-09-01 DIAGNOSIS — M6281 Muscle weakness (generalized): Secondary | ICD-10-CM | POA: Diagnosis not present

## 2019-09-01 DIAGNOSIS — R627 Adult failure to thrive: Secondary | ICD-10-CM | POA: Diagnosis not present

## 2019-09-02 DIAGNOSIS — J189 Pneumonia, unspecified organism: Secondary | ICD-10-CM | POA: Diagnosis not present

## 2019-09-02 DIAGNOSIS — F0390 Unspecified dementia without behavioral disturbance: Secondary | ICD-10-CM | POA: Diagnosis not present

## 2019-09-02 DIAGNOSIS — I10 Essential (primary) hypertension: Secondary | ICD-10-CM | POA: Diagnosis not present

## 2019-09-02 DIAGNOSIS — M6281 Muscle weakness (generalized): Secondary | ICD-10-CM | POA: Diagnosis not present

## 2019-09-02 DIAGNOSIS — R627 Adult failure to thrive: Secondary | ICD-10-CM | POA: Diagnosis not present

## 2019-09-02 DIAGNOSIS — Z9181 History of falling: Secondary | ICD-10-CM | POA: Diagnosis not present

## 2019-09-03 DIAGNOSIS — R627 Adult failure to thrive: Secondary | ICD-10-CM | POA: Diagnosis not present

## 2019-09-03 DIAGNOSIS — M6281 Muscle weakness (generalized): Secondary | ICD-10-CM | POA: Diagnosis not present

## 2019-09-03 DIAGNOSIS — Z9181 History of falling: Secondary | ICD-10-CM | POA: Diagnosis not present

## 2019-09-03 DIAGNOSIS — J189 Pneumonia, unspecified organism: Secondary | ICD-10-CM | POA: Diagnosis not present

## 2019-09-03 DIAGNOSIS — I10 Essential (primary) hypertension: Secondary | ICD-10-CM | POA: Diagnosis not present

## 2019-09-03 DIAGNOSIS — F0390 Unspecified dementia without behavioral disturbance: Secondary | ICD-10-CM | POA: Diagnosis not present

## 2019-09-06 DIAGNOSIS — I10 Essential (primary) hypertension: Secondary | ICD-10-CM | POA: Diagnosis not present

## 2019-09-06 DIAGNOSIS — M6281 Muscle weakness (generalized): Secondary | ICD-10-CM | POA: Diagnosis not present

## 2019-09-06 DIAGNOSIS — F0390 Unspecified dementia without behavioral disturbance: Secondary | ICD-10-CM | POA: Diagnosis not present

## 2019-09-06 DIAGNOSIS — J189 Pneumonia, unspecified organism: Secondary | ICD-10-CM | POA: Diagnosis not present

## 2019-09-06 DIAGNOSIS — R627 Adult failure to thrive: Secondary | ICD-10-CM | POA: Diagnosis not present

## 2019-09-06 DIAGNOSIS — Z9181 History of falling: Secondary | ICD-10-CM | POA: Diagnosis not present

## 2019-09-07 DIAGNOSIS — F0390 Unspecified dementia without behavioral disturbance: Secondary | ICD-10-CM | POA: Diagnosis not present

## 2019-09-07 DIAGNOSIS — R627 Adult failure to thrive: Secondary | ICD-10-CM | POA: Diagnosis not present

## 2019-09-07 DIAGNOSIS — M6281 Muscle weakness (generalized): Secondary | ICD-10-CM | POA: Diagnosis not present

## 2019-09-07 DIAGNOSIS — I10 Essential (primary) hypertension: Secondary | ICD-10-CM | POA: Diagnosis not present

## 2019-09-07 DIAGNOSIS — J189 Pneumonia, unspecified organism: Secondary | ICD-10-CM | POA: Diagnosis not present

## 2019-09-07 DIAGNOSIS — Z9181 History of falling: Secondary | ICD-10-CM | POA: Diagnosis not present

## 2019-09-08 DIAGNOSIS — F0390 Unspecified dementia without behavioral disturbance: Secondary | ICD-10-CM | POA: Diagnosis not present

## 2019-09-08 DIAGNOSIS — M6281 Muscle weakness (generalized): Secondary | ICD-10-CM | POA: Diagnosis not present

## 2019-09-08 DIAGNOSIS — R627 Adult failure to thrive: Secondary | ICD-10-CM | POA: Diagnosis not present

## 2019-09-08 DIAGNOSIS — I10 Essential (primary) hypertension: Secondary | ICD-10-CM | POA: Diagnosis not present

## 2019-09-08 DIAGNOSIS — Z9181 History of falling: Secondary | ICD-10-CM | POA: Diagnosis not present

## 2019-09-08 DIAGNOSIS — J189 Pneumonia, unspecified organism: Secondary | ICD-10-CM | POA: Diagnosis not present

## 2019-09-09 DIAGNOSIS — R627 Adult failure to thrive: Secondary | ICD-10-CM | POA: Diagnosis not present

## 2019-09-09 DIAGNOSIS — Z9181 History of falling: Secondary | ICD-10-CM | POA: Diagnosis not present

## 2019-09-09 DIAGNOSIS — M6281 Muscle weakness (generalized): Secondary | ICD-10-CM | POA: Diagnosis not present

## 2019-09-09 DIAGNOSIS — I10 Essential (primary) hypertension: Secondary | ICD-10-CM | POA: Diagnosis not present

## 2019-09-09 DIAGNOSIS — J189 Pneumonia, unspecified organism: Secondary | ICD-10-CM | POA: Diagnosis not present

## 2019-09-09 DIAGNOSIS — F0390 Unspecified dementia without behavioral disturbance: Secondary | ICD-10-CM | POA: Diagnosis not present

## 2019-09-10 DIAGNOSIS — F0391 Unspecified dementia with behavioral disturbance: Secondary | ICD-10-CM | POA: Diagnosis not present

## 2019-09-10 DIAGNOSIS — W19XXXD Unspecified fall, subsequent encounter: Secondary | ICD-10-CM | POA: Diagnosis not present

## 2019-09-10 DIAGNOSIS — J849 Interstitial pulmonary disease, unspecified: Secondary | ICD-10-CM | POA: Diagnosis not present

## 2019-09-10 DIAGNOSIS — I10 Essential (primary) hypertension: Secondary | ICD-10-CM | POA: Diagnosis not present

## 2019-09-12 DIAGNOSIS — J189 Pneumonia, unspecified organism: Secondary | ICD-10-CM | POA: Diagnosis not present

## 2019-09-12 DIAGNOSIS — Z9181 History of falling: Secondary | ICD-10-CM | POA: Diagnosis not present

## 2019-09-12 DIAGNOSIS — F0390 Unspecified dementia without behavioral disturbance: Secondary | ICD-10-CM | POA: Diagnosis not present

## 2019-09-12 DIAGNOSIS — I10 Essential (primary) hypertension: Secondary | ICD-10-CM | POA: Diagnosis not present

## 2019-09-12 DIAGNOSIS — R627 Adult failure to thrive: Secondary | ICD-10-CM | POA: Diagnosis not present

## 2019-09-12 DIAGNOSIS — M6281 Muscle weakness (generalized): Secondary | ICD-10-CM | POA: Diagnosis not present

## 2019-09-13 DIAGNOSIS — J189 Pneumonia, unspecified organism: Secondary | ICD-10-CM | POA: Diagnosis not present

## 2019-09-13 DIAGNOSIS — Z9181 History of falling: Secondary | ICD-10-CM | POA: Diagnosis not present

## 2019-09-13 DIAGNOSIS — I10 Essential (primary) hypertension: Secondary | ICD-10-CM | POA: Diagnosis not present

## 2019-09-13 DIAGNOSIS — F0390 Unspecified dementia without behavioral disturbance: Secondary | ICD-10-CM | POA: Diagnosis not present

## 2019-09-13 DIAGNOSIS — M6281 Muscle weakness (generalized): Secondary | ICD-10-CM | POA: Diagnosis not present

## 2019-09-13 DIAGNOSIS — R627 Adult failure to thrive: Secondary | ICD-10-CM | POA: Diagnosis not present

## 2019-09-14 DIAGNOSIS — M6281 Muscle weakness (generalized): Secondary | ICD-10-CM | POA: Diagnosis not present

## 2019-09-14 DIAGNOSIS — J189 Pneumonia, unspecified organism: Secondary | ICD-10-CM | POA: Diagnosis not present

## 2019-09-14 DIAGNOSIS — Z9181 History of falling: Secondary | ICD-10-CM | POA: Diagnosis not present

## 2019-09-14 DIAGNOSIS — F0390 Unspecified dementia without behavioral disturbance: Secondary | ICD-10-CM | POA: Diagnosis not present

## 2019-09-14 DIAGNOSIS — I10 Essential (primary) hypertension: Secondary | ICD-10-CM | POA: Diagnosis not present

## 2019-09-14 DIAGNOSIS — R627 Adult failure to thrive: Secondary | ICD-10-CM | POA: Diagnosis not present

## 2019-09-15 DIAGNOSIS — I10 Essential (primary) hypertension: Secondary | ICD-10-CM | POA: Diagnosis not present

## 2019-09-15 DIAGNOSIS — F0390 Unspecified dementia without behavioral disturbance: Secondary | ICD-10-CM | POA: Diagnosis not present

## 2019-09-15 DIAGNOSIS — M6281 Muscle weakness (generalized): Secondary | ICD-10-CM | POA: Diagnosis not present

## 2019-09-15 DIAGNOSIS — Z9181 History of falling: Secondary | ICD-10-CM | POA: Diagnosis not present

## 2019-09-15 DIAGNOSIS — R627 Adult failure to thrive: Secondary | ICD-10-CM | POA: Diagnosis not present

## 2019-09-15 DIAGNOSIS — J189 Pneumonia, unspecified organism: Secondary | ICD-10-CM | POA: Diagnosis not present

## 2019-09-16 DIAGNOSIS — Z9181 History of falling: Secondary | ICD-10-CM | POA: Diagnosis not present

## 2019-09-16 DIAGNOSIS — R627 Adult failure to thrive: Secondary | ICD-10-CM | POA: Diagnosis not present

## 2019-09-16 DIAGNOSIS — I10 Essential (primary) hypertension: Secondary | ICD-10-CM | POA: Diagnosis not present

## 2019-09-16 DIAGNOSIS — F0390 Unspecified dementia without behavioral disturbance: Secondary | ICD-10-CM | POA: Diagnosis not present

## 2019-09-16 DIAGNOSIS — J189 Pneumonia, unspecified organism: Secondary | ICD-10-CM | POA: Diagnosis not present

## 2019-09-16 DIAGNOSIS — M6281 Muscle weakness (generalized): Secondary | ICD-10-CM | POA: Diagnosis not present

## 2019-09-17 DIAGNOSIS — I10 Essential (primary) hypertension: Secondary | ICD-10-CM | POA: Diagnosis not present

## 2019-09-17 DIAGNOSIS — J189 Pneumonia, unspecified organism: Secondary | ICD-10-CM | POA: Diagnosis not present

## 2019-09-17 DIAGNOSIS — M6281 Muscle weakness (generalized): Secondary | ICD-10-CM | POA: Diagnosis not present

## 2019-09-17 DIAGNOSIS — R627 Adult failure to thrive: Secondary | ICD-10-CM | POA: Diagnosis not present

## 2019-09-17 DIAGNOSIS — Z9181 History of falling: Secondary | ICD-10-CM | POA: Diagnosis not present

## 2019-09-17 DIAGNOSIS — F0390 Unspecified dementia without behavioral disturbance: Secondary | ICD-10-CM | POA: Diagnosis not present

## 2019-09-19 DIAGNOSIS — J189 Pneumonia, unspecified organism: Secondary | ICD-10-CM | POA: Diagnosis not present

## 2019-09-19 DIAGNOSIS — Z9181 History of falling: Secondary | ICD-10-CM | POA: Diagnosis not present

## 2019-09-19 DIAGNOSIS — I10 Essential (primary) hypertension: Secondary | ICD-10-CM | POA: Diagnosis not present

## 2019-09-19 DIAGNOSIS — R627 Adult failure to thrive: Secondary | ICD-10-CM | POA: Diagnosis not present

## 2019-09-19 DIAGNOSIS — F0390 Unspecified dementia without behavioral disturbance: Secondary | ICD-10-CM | POA: Diagnosis not present

## 2019-09-19 DIAGNOSIS — M6281 Muscle weakness (generalized): Secondary | ICD-10-CM | POA: Diagnosis not present

## 2019-09-20 DIAGNOSIS — Z9181 History of falling: Secondary | ICD-10-CM | POA: Diagnosis not present

## 2019-09-20 DIAGNOSIS — F0390 Unspecified dementia without behavioral disturbance: Secondary | ICD-10-CM | POA: Diagnosis not present

## 2019-09-20 DIAGNOSIS — M6281 Muscle weakness (generalized): Secondary | ICD-10-CM | POA: Diagnosis not present

## 2019-09-20 DIAGNOSIS — J189 Pneumonia, unspecified organism: Secondary | ICD-10-CM | POA: Diagnosis not present

## 2019-09-20 DIAGNOSIS — I10 Essential (primary) hypertension: Secondary | ICD-10-CM | POA: Diagnosis not present

## 2019-09-20 DIAGNOSIS — R627 Adult failure to thrive: Secondary | ICD-10-CM | POA: Diagnosis not present

## 2019-09-21 DIAGNOSIS — I10 Essential (primary) hypertension: Secondary | ICD-10-CM | POA: Diagnosis not present

## 2019-09-21 DIAGNOSIS — M6281 Muscle weakness (generalized): Secondary | ICD-10-CM | POA: Diagnosis not present

## 2019-09-21 DIAGNOSIS — J189 Pneumonia, unspecified organism: Secondary | ICD-10-CM | POA: Diagnosis not present

## 2019-09-21 DIAGNOSIS — F0390 Unspecified dementia without behavioral disturbance: Secondary | ICD-10-CM | POA: Diagnosis not present

## 2019-09-21 DIAGNOSIS — R627 Adult failure to thrive: Secondary | ICD-10-CM | POA: Diagnosis not present

## 2019-09-21 DIAGNOSIS — Z9181 History of falling: Secondary | ICD-10-CM | POA: Diagnosis not present

## 2019-09-22 DIAGNOSIS — Z9181 History of falling: Secondary | ICD-10-CM | POA: Diagnosis not present

## 2019-09-22 DIAGNOSIS — R627 Adult failure to thrive: Secondary | ICD-10-CM | POA: Diagnosis not present

## 2019-09-22 DIAGNOSIS — J189 Pneumonia, unspecified organism: Secondary | ICD-10-CM | POA: Diagnosis not present

## 2019-09-22 DIAGNOSIS — F0390 Unspecified dementia without behavioral disturbance: Secondary | ICD-10-CM | POA: Diagnosis not present

## 2019-09-22 DIAGNOSIS — I10 Essential (primary) hypertension: Secondary | ICD-10-CM | POA: Diagnosis not present

## 2019-09-22 DIAGNOSIS — M6281 Muscle weakness (generalized): Secondary | ICD-10-CM | POA: Diagnosis not present

## 2019-09-23 DIAGNOSIS — Z9181 History of falling: Secondary | ICD-10-CM | POA: Diagnosis not present

## 2019-09-23 DIAGNOSIS — R627 Adult failure to thrive: Secondary | ICD-10-CM | POA: Diagnosis not present

## 2019-09-23 DIAGNOSIS — I10 Essential (primary) hypertension: Secondary | ICD-10-CM | POA: Diagnosis not present

## 2019-09-23 DIAGNOSIS — F0390 Unspecified dementia without behavioral disturbance: Secondary | ICD-10-CM | POA: Diagnosis not present

## 2019-09-23 DIAGNOSIS — J189 Pneumonia, unspecified organism: Secondary | ICD-10-CM | POA: Diagnosis not present

## 2019-09-23 DIAGNOSIS — M6281 Muscle weakness (generalized): Secondary | ICD-10-CM | POA: Diagnosis not present

## 2019-09-24 DIAGNOSIS — Z9181 History of falling: Secondary | ICD-10-CM | POA: Diagnosis not present

## 2019-09-24 DIAGNOSIS — R627 Adult failure to thrive: Secondary | ICD-10-CM | POA: Diagnosis not present

## 2019-09-24 DIAGNOSIS — F0390 Unspecified dementia without behavioral disturbance: Secondary | ICD-10-CM | POA: Diagnosis not present

## 2019-09-24 DIAGNOSIS — J189 Pneumonia, unspecified organism: Secondary | ICD-10-CM | POA: Diagnosis not present

## 2019-09-24 DIAGNOSIS — M6281 Muscle weakness (generalized): Secondary | ICD-10-CM | POA: Diagnosis not present

## 2019-09-24 DIAGNOSIS — I10 Essential (primary) hypertension: Secondary | ICD-10-CM | POA: Diagnosis not present

## 2019-09-25 DIAGNOSIS — F0390 Unspecified dementia without behavioral disturbance: Secondary | ICD-10-CM | POA: Diagnosis not present

## 2019-09-25 DIAGNOSIS — I10 Essential (primary) hypertension: Secondary | ICD-10-CM | POA: Diagnosis not present

## 2019-09-25 DIAGNOSIS — Z9181 History of falling: Secondary | ICD-10-CM | POA: Diagnosis not present

## 2019-09-25 DIAGNOSIS — M6281 Muscle weakness (generalized): Secondary | ICD-10-CM | POA: Diagnosis not present

## 2019-09-25 DIAGNOSIS — J189 Pneumonia, unspecified organism: Secondary | ICD-10-CM | POA: Diagnosis not present

## 2019-09-25 DIAGNOSIS — R627 Adult failure to thrive: Secondary | ICD-10-CM | POA: Diagnosis not present

## 2019-09-26 DIAGNOSIS — J849 Interstitial pulmonary disease, unspecified: Secondary | ICD-10-CM | POA: Diagnosis not present

## 2019-09-26 DIAGNOSIS — F0391 Unspecified dementia with behavioral disturbance: Secondary | ICD-10-CM | POA: Diagnosis not present

## 2019-09-26 DIAGNOSIS — I10 Essential (primary) hypertension: Secondary | ICD-10-CM | POA: Diagnosis not present

## 2019-09-26 DIAGNOSIS — F341 Dysthymic disorder: Secondary | ICD-10-CM | POA: Diagnosis not present

## 2019-09-26 DIAGNOSIS — H02105 Unspecified ectropion of left lower eyelid: Secondary | ICD-10-CM | POA: Diagnosis not present

## 2019-09-26 DIAGNOSIS — R0902 Hypoxemia: Secondary | ICD-10-CM | POA: Diagnosis not present

## 2019-09-28 DIAGNOSIS — F0391 Unspecified dementia with behavioral disturbance: Secondary | ICD-10-CM | POA: Diagnosis not present

## 2019-09-28 DIAGNOSIS — J849 Interstitial pulmonary disease, unspecified: Secondary | ICD-10-CM | POA: Diagnosis not present

## 2019-09-28 DIAGNOSIS — R0902 Hypoxemia: Secondary | ICD-10-CM | POA: Diagnosis not present

## 2019-09-28 DIAGNOSIS — I10 Essential (primary) hypertension: Secondary | ICD-10-CM | POA: Diagnosis not present

## 2019-09-29 DIAGNOSIS — J189 Pneumonia, unspecified organism: Secondary | ICD-10-CM | POA: Diagnosis not present

## 2019-09-29 DIAGNOSIS — I10 Essential (primary) hypertension: Secondary | ICD-10-CM | POA: Diagnosis not present

## 2019-09-29 DIAGNOSIS — F0390 Unspecified dementia without behavioral disturbance: Secondary | ICD-10-CM | POA: Diagnosis not present

## 2019-09-29 DIAGNOSIS — Z9181 History of falling: Secondary | ICD-10-CM | POA: Diagnosis not present

## 2019-09-29 DIAGNOSIS — M6281 Muscle weakness (generalized): Secondary | ICD-10-CM | POA: Diagnosis not present

## 2019-09-29 DIAGNOSIS — R627 Adult failure to thrive: Secondary | ICD-10-CM | POA: Diagnosis not present

## 2019-09-30 DIAGNOSIS — Z20828 Contact with and (suspected) exposure to other viral communicable diseases: Secondary | ICD-10-CM | POA: Diagnosis not present

## 2019-10-06 DIAGNOSIS — Z20828 Contact with and (suspected) exposure to other viral communicable diseases: Secondary | ICD-10-CM | POA: Diagnosis not present

## 2019-10-08 DIAGNOSIS — I10 Essential (primary) hypertension: Secondary | ICD-10-CM | POA: Diagnosis not present

## 2019-10-08 DIAGNOSIS — D638 Anemia in other chronic diseases classified elsewhere: Secondary | ICD-10-CM | POA: Diagnosis not present

## 2019-10-08 DIAGNOSIS — F322 Major depressive disorder, single episode, severe without psychotic features: Secondary | ICD-10-CM | POA: Diagnosis not present

## 2019-10-08 DIAGNOSIS — E440 Moderate protein-calorie malnutrition: Secondary | ICD-10-CM | POA: Diagnosis not present

## 2019-10-08 DIAGNOSIS — F0151 Vascular dementia with behavioral disturbance: Secondary | ICD-10-CM | POA: Diagnosis not present

## 2019-10-09 DIAGNOSIS — F341 Dysthymic disorder: Secondary | ICD-10-CM | POA: Diagnosis not present

## 2019-10-09 DIAGNOSIS — R0902 Hypoxemia: Secondary | ICD-10-CM | POA: Diagnosis not present

## 2019-10-09 DIAGNOSIS — H02105 Unspecified ectropion of left lower eyelid: Secondary | ICD-10-CM | POA: Diagnosis not present

## 2019-10-09 DIAGNOSIS — F0391 Unspecified dementia with behavioral disturbance: Secondary | ICD-10-CM | POA: Diagnosis not present

## 2019-10-09 DIAGNOSIS — J849 Interstitial pulmonary disease, unspecified: Secondary | ICD-10-CM | POA: Diagnosis not present

## 2019-10-13 DIAGNOSIS — Z79899 Other long term (current) drug therapy: Secondary | ICD-10-CM | POA: Diagnosis not present

## 2019-10-13 DIAGNOSIS — D649 Anemia, unspecified: Secondary | ICD-10-CM | POA: Diagnosis not present

## 2019-10-19 DIAGNOSIS — R05 Cough: Secondary | ICD-10-CM | POA: Diagnosis not present

## 2019-10-19 DIAGNOSIS — F0391 Unspecified dementia with behavioral disturbance: Secondary | ICD-10-CM | POA: Diagnosis not present

## 2019-10-19 DIAGNOSIS — Z20828 Contact with and (suspected) exposure to other viral communicable diseases: Secondary | ICD-10-CM | POA: Diagnosis not present

## 2019-10-19 DIAGNOSIS — J849 Interstitial pulmonary disease, unspecified: Secondary | ICD-10-CM | POA: Diagnosis not present

## 2019-10-19 DIAGNOSIS — R0902 Hypoxemia: Secondary | ICD-10-CM | POA: Diagnosis not present

## 2019-10-20 DIAGNOSIS — R05 Cough: Secondary | ICD-10-CM | POA: Diagnosis not present

## 2019-10-20 DIAGNOSIS — R0902 Hypoxemia: Secondary | ICD-10-CM | POA: Diagnosis not present

## 2019-10-20 DIAGNOSIS — J849 Interstitial pulmonary disease, unspecified: Secondary | ICD-10-CM | POA: Diagnosis not present

## 2019-10-20 DIAGNOSIS — F0391 Unspecified dementia with behavioral disturbance: Secondary | ICD-10-CM | POA: Diagnosis not present

## 2019-10-26 DIAGNOSIS — Z20828 Contact with and (suspected) exposure to other viral communicable diseases: Secondary | ICD-10-CM | POA: Diagnosis not present

## 2019-11-02 DIAGNOSIS — Z20828 Contact with and (suspected) exposure to other viral communicable diseases: Secondary | ICD-10-CM | POA: Diagnosis not present

## 2019-11-05 DIAGNOSIS — I10 Essential (primary) hypertension: Secondary | ICD-10-CM | POA: Diagnosis not present

## 2019-11-05 DIAGNOSIS — E440 Moderate protein-calorie malnutrition: Secondary | ICD-10-CM | POA: Diagnosis not present

## 2019-11-05 DIAGNOSIS — F0151 Vascular dementia with behavioral disturbance: Secondary | ICD-10-CM | POA: Diagnosis not present

## 2019-11-05 DIAGNOSIS — F322 Major depressive disorder, single episode, severe without psychotic features: Secondary | ICD-10-CM | POA: Diagnosis not present

## 2019-11-05 DIAGNOSIS — D638 Anemia in other chronic diseases classified elsewhere: Secondary | ICD-10-CM | POA: Diagnosis not present

## 2019-11-09 DIAGNOSIS — Z20828 Contact with and (suspected) exposure to other viral communicable diseases: Secondary | ICD-10-CM | POA: Diagnosis not present

## 2019-11-10 DIAGNOSIS — J849 Interstitial pulmonary disease, unspecified: Secondary | ICD-10-CM | POA: Diagnosis not present

## 2019-11-10 DIAGNOSIS — I10 Essential (primary) hypertension: Secondary | ICD-10-CM | POA: Diagnosis not present

## 2019-11-10 DIAGNOSIS — F0391 Unspecified dementia with behavioral disturbance: Secondary | ICD-10-CM | POA: Diagnosis not present

## 2019-11-10 DIAGNOSIS — W19XXXD Unspecified fall, subsequent encounter: Secondary | ICD-10-CM | POA: Diagnosis not present

## 2019-11-16 DIAGNOSIS — J849 Interstitial pulmonary disease, unspecified: Secondary | ICD-10-CM | POA: Diagnosis not present

## 2019-11-16 DIAGNOSIS — R4182 Altered mental status, unspecified: Secondary | ICD-10-CM | POA: Diagnosis not present

## 2019-11-16 DIAGNOSIS — D649 Anemia, unspecified: Secondary | ICD-10-CM | POA: Diagnosis not present

## 2019-11-16 DIAGNOSIS — R0902 Hypoxemia: Secondary | ICD-10-CM | POA: Diagnosis not present

## 2019-11-16 DIAGNOSIS — R05 Cough: Secondary | ICD-10-CM | POA: Diagnosis not present

## 2019-11-16 DIAGNOSIS — F0391 Unspecified dementia with behavioral disturbance: Secondary | ICD-10-CM | POA: Diagnosis not present

## 2019-11-16 DIAGNOSIS — R627 Adult failure to thrive: Secondary | ICD-10-CM | POA: Diagnosis not present

## 2019-11-16 DIAGNOSIS — Z20828 Contact with and (suspected) exposure to other viral communicable diseases: Secondary | ICD-10-CM | POA: Diagnosis not present

## 2019-11-16 DIAGNOSIS — Z79899 Other long term (current) drug therapy: Secondary | ICD-10-CM | POA: Diagnosis not present

## 2019-11-17 DIAGNOSIS — D649 Anemia, unspecified: Secondary | ICD-10-CM | POA: Diagnosis not present

## 2019-11-17 DIAGNOSIS — F0390 Unspecified dementia without behavioral disturbance: Secondary | ICD-10-CM | POA: Diagnosis not present

## 2019-11-17 DIAGNOSIS — Z9181 History of falling: Secondary | ICD-10-CM | POA: Diagnosis not present

## 2019-11-17 DIAGNOSIS — I10 Essential (primary) hypertension: Secondary | ICD-10-CM | POA: Diagnosis not present

## 2019-11-17 DIAGNOSIS — J189 Pneumonia, unspecified organism: Secondary | ICD-10-CM | POA: Diagnosis not present

## 2019-11-17 DIAGNOSIS — R627 Adult failure to thrive: Secondary | ICD-10-CM | POA: Diagnosis not present

## 2019-11-17 DIAGNOSIS — J849 Interstitial pulmonary disease, unspecified: Secondary | ICD-10-CM | POA: Diagnosis not present

## 2019-11-17 DIAGNOSIS — F22 Delusional disorders: Secondary | ICD-10-CM | POA: Diagnosis not present

## 2019-11-17 DIAGNOSIS — M6281 Muscle weakness (generalized): Secondary | ICD-10-CM | POA: Diagnosis not present

## 2019-11-17 DIAGNOSIS — R41841 Cognitive communication deficit: Secondary | ICD-10-CM | POA: Diagnosis not present

## 2019-11-17 DIAGNOSIS — F0391 Unspecified dementia with behavioral disturbance: Secondary | ICD-10-CM | POA: Diagnosis not present

## 2019-11-18 DIAGNOSIS — M6281 Muscle weakness (generalized): Secondary | ICD-10-CM | POA: Diagnosis not present

## 2019-11-18 DIAGNOSIS — R627 Adult failure to thrive: Secondary | ICD-10-CM | POA: Diagnosis not present

## 2019-11-18 DIAGNOSIS — F0390 Unspecified dementia without behavioral disturbance: Secondary | ICD-10-CM | POA: Diagnosis not present

## 2019-11-18 DIAGNOSIS — R0902 Hypoxemia: Secondary | ICD-10-CM | POA: Diagnosis not present

## 2019-11-18 DIAGNOSIS — J189 Pneumonia, unspecified organism: Secondary | ICD-10-CM | POA: Diagnosis not present

## 2019-11-18 DIAGNOSIS — F0391 Unspecified dementia with behavioral disturbance: Secondary | ICD-10-CM | POA: Diagnosis not present

## 2019-11-18 DIAGNOSIS — J849 Interstitial pulmonary disease, unspecified: Secondary | ICD-10-CM | POA: Diagnosis not present

## 2019-11-18 DIAGNOSIS — I10 Essential (primary) hypertension: Secondary | ICD-10-CM | POA: Diagnosis not present

## 2019-11-18 DIAGNOSIS — Z9181 History of falling: Secondary | ICD-10-CM | POA: Diagnosis not present

## 2019-11-19 DIAGNOSIS — Z9181 History of falling: Secondary | ICD-10-CM | POA: Diagnosis not present

## 2019-11-19 DIAGNOSIS — J849 Interstitial pulmonary disease, unspecified: Secondary | ICD-10-CM | POA: Diagnosis not present

## 2019-11-19 DIAGNOSIS — R627 Adult failure to thrive: Secondary | ICD-10-CM | POA: Diagnosis not present

## 2019-11-19 DIAGNOSIS — F0390 Unspecified dementia without behavioral disturbance: Secondary | ICD-10-CM | POA: Diagnosis not present

## 2019-11-19 DIAGNOSIS — J189 Pneumonia, unspecified organism: Secondary | ICD-10-CM | POA: Diagnosis not present

## 2019-11-19 DIAGNOSIS — F0391 Unspecified dementia with behavioral disturbance: Secondary | ICD-10-CM | POA: Diagnosis not present

## 2019-11-19 DIAGNOSIS — I10 Essential (primary) hypertension: Secondary | ICD-10-CM | POA: Diagnosis not present

## 2019-11-19 DIAGNOSIS — M6281 Muscle weakness (generalized): Secondary | ICD-10-CM | POA: Diagnosis not present

## 2019-11-20 DIAGNOSIS — J189 Pneumonia, unspecified organism: Secondary | ICD-10-CM | POA: Diagnosis not present

## 2019-11-20 DIAGNOSIS — F0391 Unspecified dementia with behavioral disturbance: Secondary | ICD-10-CM | POA: Diagnosis not present

## 2019-11-20 DIAGNOSIS — R0902 Hypoxemia: Secondary | ICD-10-CM | POA: Diagnosis not present

## 2019-11-20 DIAGNOSIS — J849 Interstitial pulmonary disease, unspecified: Secondary | ICD-10-CM | POA: Diagnosis not present

## 2019-11-20 DIAGNOSIS — R4182 Altered mental status, unspecified: Secondary | ICD-10-CM | POA: Diagnosis not present

## 2019-11-22 DIAGNOSIS — M6281 Muscle weakness (generalized): Secondary | ICD-10-CM | POA: Diagnosis not present

## 2019-11-22 DIAGNOSIS — F0391 Unspecified dementia with behavioral disturbance: Secondary | ICD-10-CM | POA: Diagnosis not present

## 2019-11-22 DIAGNOSIS — F0390 Unspecified dementia without behavioral disturbance: Secondary | ICD-10-CM | POA: Diagnosis not present

## 2019-11-22 DIAGNOSIS — R627 Adult failure to thrive: Secondary | ICD-10-CM | POA: Diagnosis not present

## 2019-11-22 DIAGNOSIS — I10 Essential (primary) hypertension: Secondary | ICD-10-CM | POA: Diagnosis not present

## 2019-11-22 DIAGNOSIS — J189 Pneumonia, unspecified organism: Secondary | ICD-10-CM | POA: Diagnosis not present

## 2019-11-22 DIAGNOSIS — J849 Interstitial pulmonary disease, unspecified: Secondary | ICD-10-CM | POA: Diagnosis not present

## 2019-11-22 DIAGNOSIS — Z9181 History of falling: Secondary | ICD-10-CM | POA: Diagnosis not present

## 2019-11-23 DIAGNOSIS — F0390 Unspecified dementia without behavioral disturbance: Secondary | ICD-10-CM | POA: Diagnosis not present

## 2019-11-23 DIAGNOSIS — I10 Essential (primary) hypertension: Secondary | ICD-10-CM | POA: Diagnosis not present

## 2019-11-23 DIAGNOSIS — J189 Pneumonia, unspecified organism: Secondary | ICD-10-CM | POA: Diagnosis not present

## 2019-11-23 DIAGNOSIS — F0391 Unspecified dementia with behavioral disturbance: Secondary | ICD-10-CM | POA: Diagnosis not present

## 2019-11-23 DIAGNOSIS — J849 Interstitial pulmonary disease, unspecified: Secondary | ICD-10-CM | POA: Diagnosis not present

## 2019-11-23 DIAGNOSIS — M6281 Muscle weakness (generalized): Secondary | ICD-10-CM | POA: Diagnosis not present

## 2019-11-23 DIAGNOSIS — R627 Adult failure to thrive: Secondary | ICD-10-CM | POA: Diagnosis not present

## 2019-11-23 DIAGNOSIS — Z9181 History of falling: Secondary | ICD-10-CM | POA: Diagnosis not present

## 2019-11-24 DIAGNOSIS — Z9181 History of falling: Secondary | ICD-10-CM | POA: Diagnosis not present

## 2019-11-24 DIAGNOSIS — R627 Adult failure to thrive: Secondary | ICD-10-CM | POA: Diagnosis not present

## 2019-11-24 DIAGNOSIS — I10 Essential (primary) hypertension: Secondary | ICD-10-CM | POA: Diagnosis not present

## 2019-11-24 DIAGNOSIS — F0390 Unspecified dementia without behavioral disturbance: Secondary | ICD-10-CM | POA: Diagnosis not present

## 2019-11-24 DIAGNOSIS — M6281 Muscle weakness (generalized): Secondary | ICD-10-CM | POA: Diagnosis not present

## 2019-11-24 DIAGNOSIS — J189 Pneumonia, unspecified organism: Secondary | ICD-10-CM | POA: Diagnosis not present

## 2019-11-25 DIAGNOSIS — J189 Pneumonia, unspecified organism: Secondary | ICD-10-CM | POA: Diagnosis not present

## 2019-11-25 DIAGNOSIS — Z9181 History of falling: Secondary | ICD-10-CM | POA: Diagnosis not present

## 2019-11-25 DIAGNOSIS — R627 Adult failure to thrive: Secondary | ICD-10-CM | POA: Diagnosis not present

## 2019-11-25 DIAGNOSIS — F0390 Unspecified dementia without behavioral disturbance: Secondary | ICD-10-CM | POA: Diagnosis not present

## 2019-11-25 DIAGNOSIS — M6281 Muscle weakness (generalized): Secondary | ICD-10-CM | POA: Diagnosis not present

## 2019-11-25 DIAGNOSIS — I10 Essential (primary) hypertension: Secondary | ICD-10-CM | POA: Diagnosis not present

## 2019-11-28 DIAGNOSIS — R627 Adult failure to thrive: Secondary | ICD-10-CM | POA: Diagnosis not present

## 2019-11-28 DIAGNOSIS — I10 Essential (primary) hypertension: Secondary | ICD-10-CM | POA: Diagnosis not present

## 2019-11-28 DIAGNOSIS — J189 Pneumonia, unspecified organism: Secondary | ICD-10-CM | POA: Diagnosis not present

## 2019-11-28 DIAGNOSIS — Z9181 History of falling: Secondary | ICD-10-CM | POA: Diagnosis not present

## 2019-11-28 DIAGNOSIS — F0390 Unspecified dementia without behavioral disturbance: Secondary | ICD-10-CM | POA: Diagnosis not present

## 2019-11-28 DIAGNOSIS — M6281 Muscle weakness (generalized): Secondary | ICD-10-CM | POA: Diagnosis not present

## 2019-11-29 DIAGNOSIS — R627 Adult failure to thrive: Secondary | ICD-10-CM | POA: Diagnosis not present

## 2019-11-29 DIAGNOSIS — M6281 Muscle weakness (generalized): Secondary | ICD-10-CM | POA: Diagnosis not present

## 2019-11-29 DIAGNOSIS — J849 Interstitial pulmonary disease, unspecified: Secondary | ICD-10-CM | POA: Diagnosis not present

## 2019-11-29 DIAGNOSIS — I10 Essential (primary) hypertension: Secondary | ICD-10-CM | POA: Diagnosis not present

## 2019-11-29 DIAGNOSIS — F0390 Unspecified dementia without behavioral disturbance: Secondary | ICD-10-CM | POA: Diagnosis not present

## 2019-11-29 DIAGNOSIS — J189 Pneumonia, unspecified organism: Secondary | ICD-10-CM | POA: Diagnosis not present

## 2019-11-29 DIAGNOSIS — F0391 Unspecified dementia with behavioral disturbance: Secondary | ICD-10-CM | POA: Diagnosis not present

## 2019-11-29 DIAGNOSIS — Z9181 History of falling: Secondary | ICD-10-CM | POA: Diagnosis not present

## 2019-11-30 DIAGNOSIS — Z20828 Contact with and (suspected) exposure to other viral communicable diseases: Secondary | ICD-10-CM | POA: Diagnosis not present

## 2019-12-04 DIAGNOSIS — R0902 Hypoxemia: Secondary | ICD-10-CM | POA: Diagnosis not present

## 2019-12-04 DIAGNOSIS — J849 Interstitial pulmonary disease, unspecified: Secondary | ICD-10-CM | POA: Diagnosis not present

## 2019-12-04 DIAGNOSIS — J189 Pneumonia, unspecified organism: Secondary | ICD-10-CM | POA: Diagnosis not present

## 2019-12-04 DIAGNOSIS — F0391 Unspecified dementia with behavioral disturbance: Secondary | ICD-10-CM | POA: Diagnosis not present

## 2019-12-04 DIAGNOSIS — D649 Anemia, unspecified: Secondary | ICD-10-CM | POA: Diagnosis not present

## 2019-12-04 DIAGNOSIS — I10 Essential (primary) hypertension: Secondary | ICD-10-CM | POA: Diagnosis not present

## 2019-12-06 DIAGNOSIS — J180 Bronchopneumonia, unspecified organism: Secondary | ICD-10-CM | POA: Diagnosis not present

## 2019-12-06 DIAGNOSIS — F0391 Unspecified dementia with behavioral disturbance: Secondary | ICD-10-CM | POA: Diagnosis not present

## 2019-12-06 DIAGNOSIS — R0902 Hypoxemia: Secondary | ICD-10-CM | POA: Diagnosis not present

## 2019-12-06 DIAGNOSIS — J849 Interstitial pulmonary disease, unspecified: Secondary | ICD-10-CM | POA: Diagnosis not present

## 2019-12-07 DIAGNOSIS — J849 Interstitial pulmonary disease, unspecified: Secondary | ICD-10-CM | POA: Diagnosis not present

## 2019-12-07 DIAGNOSIS — F0391 Unspecified dementia with behavioral disturbance: Secondary | ICD-10-CM | POA: Diagnosis not present

## 2019-12-07 DIAGNOSIS — R0902 Hypoxemia: Secondary | ICD-10-CM | POA: Diagnosis not present

## 2019-12-07 DIAGNOSIS — I10 Essential (primary) hypertension: Secondary | ICD-10-CM | POA: Diagnosis not present

## 2019-12-10 DIAGNOSIS — I10 Essential (primary) hypertension: Secondary | ICD-10-CM | POA: Diagnosis not present

## 2019-12-10 DIAGNOSIS — F322 Major depressive disorder, single episode, severe without psychotic features: Secondary | ICD-10-CM | POA: Diagnosis not present

## 2019-12-10 DIAGNOSIS — F0151 Vascular dementia with behavioral disturbance: Secondary | ICD-10-CM | POA: Diagnosis not present

## 2019-12-10 DIAGNOSIS — E440 Moderate protein-calorie malnutrition: Secondary | ICD-10-CM | POA: Diagnosis not present

## 2020-01-31 DEATH — deceased
